# Patient Record
Sex: Male | Born: 1935 | Race: Black or African American | Hispanic: No | Marital: Married | Smoking: Former smoker
Health system: Southern US, Community
[De-identification: ages and names within clinical notes are randomized; demographics above are authoritative.]

## PROBLEM LIST (undated history)

## (undated) DIAGNOSIS — I219 Acute myocardial infarction, unspecified: Secondary | ICD-10-CM

## (undated) DIAGNOSIS — F0390 Unspecified dementia without behavioral disturbance: Secondary | ICD-10-CM

## (undated) DIAGNOSIS — S82142A Displaced bicondylar fracture of left tibia, initial encounter for closed fracture: Secondary | ICD-10-CM

## (undated) DIAGNOSIS — Z951 Presence of aortocoronary bypass graft: Secondary | ICD-10-CM

## (undated) DIAGNOSIS — I639 Cerebral infarction, unspecified: Secondary | ICD-10-CM

## (undated) DIAGNOSIS — I10 Essential (primary) hypertension: Secondary | ICD-10-CM

## (undated) DIAGNOSIS — I2510 Atherosclerotic heart disease of native coronary artery without angina pectoris: Secondary | ICD-10-CM

## (undated) DIAGNOSIS — I824Z9 Acute embolism and thrombosis of unspecified deep veins of unspecified distal lower extremity: Secondary | ICD-10-CM

## (undated) DIAGNOSIS — M1990 Unspecified osteoarthritis, unspecified site: Secondary | ICD-10-CM

## (undated) DIAGNOSIS — E118 Type 2 diabetes mellitus with unspecified complications: Secondary | ICD-10-CM

## (undated) HISTORY — DX: Atherosclerotic heart disease of native coronary artery without angina pectoris: I25.10

## (undated) HISTORY — DX: Unspecified osteoarthritis, unspecified site: M19.90

## (undated) HISTORY — DX: Cerebral infarction, unspecified: I63.9

## (undated) HISTORY — DX: Displaced bicondylar fracture of left tibia, initial encounter for closed fracture: S82.142A

## (undated) HISTORY — DX: Presence of aortocoronary bypass graft: Z95.1

## (undated) HISTORY — DX: Acute embolism and thrombosis of unspecified deep veins of unspecified distal lower extremity: I82.4Z9

## (undated) HISTORY — DX: Type 2 diabetes mellitus with unspecified complications: E11.8

## (undated) HISTORY — PX: LEG SURGERY: SHX1003

## (undated) HISTORY — PX: NM MYOVIEW LTD: HXRAD82

## (undated) HISTORY — DX: Essential (primary) hypertension: I10

## (undated) HISTORY — DX: Acute myocardial infarction, unspecified: I21.9

---

## 1996-05-31 DIAGNOSIS — Z951 Presence of aortocoronary bypass graft: Secondary | ICD-10-CM

## 1996-05-31 HISTORY — DX: Presence of aortocoronary bypass graft: Z95.1

## 1996-05-31 HISTORY — PX: CORONARY ARTERY BYPASS GRAFT: SHX141

## 1998-05-31 HISTORY — PX: CARDIAC CATHETERIZATION: SHX172

## 1998-11-12 ENCOUNTER — Ambulatory Visit (HOSPITAL_COMMUNITY): Admission: RE | Admit: 1998-11-12 | Discharge: 1998-11-12 | Payer: Self-pay | Admitting: Cardiology

## 2002-11-14 ENCOUNTER — Ambulatory Visit (HOSPITAL_COMMUNITY): Admission: RE | Admit: 2002-11-14 | Discharge: 2002-11-14 | Payer: Self-pay | Admitting: Endocrinology

## 2002-11-14 ENCOUNTER — Encounter: Payer: Self-pay | Admitting: Endocrinology

## 2003-01-18 ENCOUNTER — Emergency Department (HOSPITAL_COMMUNITY): Admission: EM | Admit: 2003-01-18 | Discharge: 2003-01-18 | Payer: Self-pay | Admitting: Emergency Medicine

## 2004-05-21 ENCOUNTER — Emergency Department (HOSPITAL_COMMUNITY): Admission: EM | Admit: 2004-05-21 | Discharge: 2004-05-21 | Payer: Self-pay | Admitting: Emergency Medicine

## 2004-08-11 ENCOUNTER — Emergency Department (HOSPITAL_COMMUNITY): Admission: EM | Admit: 2004-08-11 | Discharge: 2004-08-12 | Payer: Self-pay | Admitting: Emergency Medicine

## 2004-08-17 ENCOUNTER — Ambulatory Visit: Payer: Self-pay | Admitting: Endocrinology

## 2005-02-26 ENCOUNTER — Emergency Department (HOSPITAL_COMMUNITY): Admission: EM | Admit: 2005-02-26 | Discharge: 2005-02-27 | Payer: Self-pay | Admitting: Emergency Medicine

## 2005-06-08 ENCOUNTER — Ambulatory Visit: Payer: Self-pay | Admitting: Endocrinology

## 2005-06-21 ENCOUNTER — Ambulatory Visit: Payer: Self-pay | Admitting: Endocrinology

## 2005-06-27 ENCOUNTER — Ambulatory Visit: Payer: Self-pay | Admitting: Internal Medicine

## 2005-06-27 ENCOUNTER — Inpatient Hospital Stay (HOSPITAL_COMMUNITY): Admission: EM | Admit: 2005-06-27 | Discharge: 2005-06-28 | Payer: Self-pay | Admitting: *Deleted

## 2005-06-27 ENCOUNTER — Ambulatory Visit: Payer: Self-pay | Admitting: Cardiology

## 2005-06-29 ENCOUNTER — Ambulatory Visit: Payer: Self-pay

## 2005-07-06 ENCOUNTER — Ambulatory Visit: Payer: Self-pay | Admitting: Endocrinology

## 2005-07-07 ENCOUNTER — Ambulatory Visit: Payer: Self-pay | Admitting: Cardiology

## 2005-11-18 ENCOUNTER — Ambulatory Visit: Payer: Self-pay | Admitting: Endocrinology

## 2005-12-09 ENCOUNTER — Ambulatory Visit: Payer: Self-pay | Admitting: Endocrinology

## 2005-12-14 ENCOUNTER — Ambulatory Visit (HOSPITAL_COMMUNITY): Admission: RE | Admit: 2005-12-14 | Discharge: 2005-12-14 | Payer: Self-pay | Admitting: Endocrinology

## 2005-12-15 ENCOUNTER — Ambulatory Visit: Payer: Self-pay | Admitting: Endocrinology

## 2005-12-28 ENCOUNTER — Ambulatory Visit: Payer: Self-pay

## 2006-01-03 ENCOUNTER — Ambulatory Visit: Payer: Self-pay | Admitting: Endocrinology

## 2006-01-24 ENCOUNTER — Ambulatory Visit: Payer: Self-pay | Admitting: Endocrinology

## 2006-01-24 ENCOUNTER — Ambulatory Visit (HOSPITAL_COMMUNITY): Admission: RE | Admit: 2006-01-24 | Discharge: 2006-01-24 | Payer: Self-pay | Admitting: Endocrinology

## 2006-02-07 ENCOUNTER — Ambulatory Visit: Payer: Self-pay | Admitting: Internal Medicine

## 2006-02-21 ENCOUNTER — Ambulatory Visit: Payer: Self-pay | Admitting: Endocrinology

## 2006-03-07 ENCOUNTER — Ambulatory Visit: Payer: Self-pay | Admitting: Endocrinology

## 2006-03-08 ENCOUNTER — Ambulatory Visit: Payer: Self-pay | Admitting: Internal Medicine

## 2006-03-14 ENCOUNTER — Ambulatory Visit: Payer: Self-pay | Admitting: Endocrinology

## 2006-04-25 ENCOUNTER — Ambulatory Visit: Payer: Self-pay | Admitting: Endocrinology

## 2006-05-11 ENCOUNTER — Ambulatory Visit: Payer: Self-pay | Admitting: Cardiology

## 2006-05-25 ENCOUNTER — Ambulatory Visit: Payer: Self-pay

## 2006-06-07 ENCOUNTER — Ambulatory Visit: Payer: Self-pay | Admitting: Endocrinology

## 2006-06-15 ENCOUNTER — Ambulatory Visit: Payer: Self-pay | Admitting: Endocrinology

## 2006-09-16 ENCOUNTER — Ambulatory Visit: Payer: Self-pay | Admitting: Endocrinology

## 2006-09-16 LAB — CONVERTED CEMR LAB
AST: 23 units/L (ref 0–37)
Basophils Absolute: 0 10*3/uL (ref 0.0–0.1)
Bilirubin, Direct: 0.1 mg/dL (ref 0.0–0.3)
Cholesterol: 185 mg/dL (ref 0–200)
Creatinine,U: 187.6 mg/dL
Eosinophils Absolute: 0.1 10*3/uL (ref 0.0–0.6)
Eosinophils Relative: 2.2 % (ref 0.0–5.0)
GFR calc Af Amer: 70 mL/min
GFR calc non Af Amer: 58 mL/min
Glucose, Bld: 135 mg/dL — ABNORMAL HIGH (ref 70–99)
HCT: 36.8 % — ABNORMAL LOW (ref 39.0–52.0)
HDL: 41.5 mg/dL (ref >39.0)
Hemoglobin, Urine: NEGATIVE
Hgb A1c MFr Bld: 7.6 % — ABNORMAL HIGH (ref 4.6–6.0)
LDL Cholesterol: 129 mg/dL — ABNORMAL HIGH (ref 0–99)
Lymphocytes Relative: 38 % (ref 12.0–46.0)
MCHC: 33.8 g/dL (ref 30.0–36.0)
MCV: 90.6 fL (ref 78.0–100.0)
Microalb Creat Ratio: 17.1 mg/g (ref 0.0–30.0)
Microalb, Ur: 3.2 mg/dL — ABNORMAL HIGH (ref 0.0–1.9)
Monocytes Absolute: 0.4 10*3/uL (ref 0.2–0.7)
Neutro Abs: 2.3 10*3/uL (ref 1.4–7.7)
Neutrophils Relative %: 49.4 % (ref 43.0–77.0)
Nitrite: NEGATIVE
Potassium: 4.2 meq/L (ref 3.5–5.1)
Sodium: 143 meq/L (ref 135–145)
Total CHOL/HDL Ratio: 4.5
Urobilinogen, UA: 0.2 (ref 0.0–1.0)
WBC: 4.5 10*3/uL (ref 4.5–10.5)

## 2006-09-22 ENCOUNTER — Ambulatory Visit: Payer: Self-pay | Admitting: Endocrinology

## 2006-11-01 ENCOUNTER — Ambulatory Visit: Payer: Self-pay | Admitting: Endocrinology

## 2006-11-11 ENCOUNTER — Ambulatory Visit: Payer: Self-pay | Admitting: Endocrinology

## 2006-12-06 ENCOUNTER — Emergency Department (HOSPITAL_COMMUNITY): Admission: EM | Admit: 2006-12-06 | Discharge: 2006-12-06 | Payer: Self-pay | Admitting: Emergency Medicine

## 2006-12-08 ENCOUNTER — Encounter: Payer: Self-pay | Admitting: Endocrinology

## 2006-12-08 DIAGNOSIS — F0390 Unspecified dementia without behavioral disturbance: Secondary | ICD-10-CM

## 2006-12-08 DIAGNOSIS — I739 Peripheral vascular disease, unspecified: Secondary | ICD-10-CM

## 2006-12-08 DIAGNOSIS — F411 Generalized anxiety disorder: Secondary | ICD-10-CM | POA: Insufficient documentation

## 2006-12-08 DIAGNOSIS — F329 Major depressive disorder, single episode, unspecified: Secondary | ICD-10-CM

## 2006-12-08 DIAGNOSIS — M1990 Unspecified osteoarthritis, unspecified site: Secondary | ICD-10-CM | POA: Insufficient documentation

## 2007-01-02 ENCOUNTER — Ambulatory Visit: Payer: Self-pay | Admitting: Endocrinology

## 2007-02-08 ENCOUNTER — Ambulatory Visit: Payer: Self-pay | Admitting: Endocrinology

## 2007-02-09 ENCOUNTER — Ambulatory Visit: Payer: Self-pay | Admitting: Endocrinology

## 2007-05-14 ENCOUNTER — Encounter: Payer: Self-pay | Admitting: Endocrinology

## 2007-05-21 ENCOUNTER — Encounter: Payer: Self-pay | Admitting: Endocrinology

## 2007-06-14 ENCOUNTER — Ambulatory Visit: Payer: Self-pay | Admitting: Endocrinology

## 2007-06-14 DIAGNOSIS — R972 Elevated prostate specific antigen [PSA]: Secondary | ICD-10-CM | POA: Insufficient documentation

## 2007-09-18 ENCOUNTER — Encounter: Payer: Self-pay | Admitting: Endocrinology

## 2007-10-25 ENCOUNTER — Ambulatory Visit: Payer: Self-pay | Admitting: Endocrinology

## 2007-10-25 DIAGNOSIS — I498 Other specified cardiac arrhythmias: Secondary | ICD-10-CM | POA: Insufficient documentation

## 2007-11-14 ENCOUNTER — Telehealth: Payer: Self-pay | Admitting: Endocrinology

## 2007-11-15 ENCOUNTER — Ambulatory Visit: Payer: Self-pay | Admitting: Endocrinology

## 2007-11-15 DIAGNOSIS — G4700 Insomnia, unspecified: Secondary | ICD-10-CM | POA: Insufficient documentation

## 2007-11-15 LAB — CONVERTED CEMR LAB
AST: 20 units/L (ref 0–37)
Albumin: 3.6 g/dL (ref 3.5–5.2)
Cholesterol: 170 mg/dL (ref 0–200)
Folate: 15.1 ng/mL
Hgb A1c MFr Bld: 7.9 % — ABNORMAL HIGH (ref 4.6–6.0)
LDL Cholesterol: 101 mg/dL — ABNORMAL HIGH (ref 0–99)
Total CHOL/HDL Ratio: 3
VLDL: 12 mg/dL (ref 0–40)

## 2008-03-27 ENCOUNTER — Ambulatory Visit: Payer: Self-pay | Admitting: Endocrinology

## 2008-04-02 ENCOUNTER — Telehealth: Payer: Self-pay | Admitting: Endocrinology

## 2008-04-02 ENCOUNTER — Encounter (INDEPENDENT_AMBULATORY_CARE_PROVIDER_SITE_OTHER): Payer: Self-pay | Admitting: *Deleted

## 2008-04-26 ENCOUNTER — Inpatient Hospital Stay (HOSPITAL_COMMUNITY): Admission: EM | Admit: 2008-04-26 | Discharge: 2008-05-07 | Payer: Self-pay | Admitting: Emergency Medicine

## 2008-04-30 ENCOUNTER — Ambulatory Visit: Payer: Self-pay | Admitting: Physical Medicine & Rehabilitation

## 2008-05-07 ENCOUNTER — Telehealth (INDEPENDENT_AMBULATORY_CARE_PROVIDER_SITE_OTHER): Payer: Self-pay | Admitting: *Deleted

## 2008-07-24 ENCOUNTER — Encounter: Payer: Self-pay | Admitting: Endocrinology

## 2008-07-29 ENCOUNTER — Encounter (INDEPENDENT_AMBULATORY_CARE_PROVIDER_SITE_OTHER): Payer: Self-pay | Admitting: *Deleted

## 2008-08-13 ENCOUNTER — Encounter (INDEPENDENT_AMBULATORY_CARE_PROVIDER_SITE_OTHER): Payer: Self-pay | Admitting: *Deleted

## 2008-08-29 ENCOUNTER — Encounter (INDEPENDENT_AMBULATORY_CARE_PROVIDER_SITE_OTHER): Payer: Self-pay | Admitting: *Deleted

## 2008-08-29 ENCOUNTER — Ambulatory Visit: Payer: Self-pay | Admitting: Internal Medicine

## 2008-08-29 DIAGNOSIS — N318 Other neuromuscular dysfunction of bladder: Secondary | ICD-10-CM

## 2008-08-29 DIAGNOSIS — S82409A Unspecified fracture of shaft of unspecified fibula, initial encounter for closed fracture: Secondary | ICD-10-CM

## 2008-08-29 DIAGNOSIS — S82209A Unspecified fracture of shaft of unspecified tibia, initial encounter for closed fracture: Secondary | ICD-10-CM | POA: Insufficient documentation

## 2008-08-29 LAB — CONVERTED CEMR LAB
AST: 27 units/L (ref 0–37)
Alkaline Phosphatase: 68 units/L (ref 39–117)
BUN: 19 mg/dL (ref 6–23)
Basophils Absolute: 0 10*3/uL (ref 0.0–0.1)
Calcium: 9.1 mg/dL (ref 8.4–10.5)
GFR calc non Af Amer: 94.05 mL/min (ref >60)
Glucose, Bld: 76 mg/dL (ref 70–99)
Hemoglobin, Urine: NEGATIVE
Hemoglobin: 11.8 g/dL — ABNORMAL LOW (ref 13.0–17.0)
Lymphocytes Relative: 43.2 % (ref 12.0–46.0)
Monocytes Relative: 10.4 % (ref 3.0–12.0)
Nitrite: NEGATIVE
Platelets: 215 10*3/uL (ref 150.0–400.0)
RDW: 13.8 % (ref 11.5–14.6)
Sodium: 136 meq/L (ref 135–145)
Total Bilirubin: 0.5 mg/dL (ref 0.3–1.2)
pH: 6 (ref 5.0–8.0)

## 2008-08-30 ENCOUNTER — Encounter: Payer: Self-pay | Admitting: Internal Medicine

## 2008-09-06 ENCOUNTER — Encounter: Payer: Self-pay | Admitting: Endocrinology

## 2008-09-06 ENCOUNTER — Telehealth (INDEPENDENT_AMBULATORY_CARE_PROVIDER_SITE_OTHER): Payer: Self-pay | Admitting: *Deleted

## 2008-09-12 ENCOUNTER — Ambulatory Visit: Payer: Self-pay | Admitting: Endocrinology

## 2008-09-17 ENCOUNTER — Encounter: Payer: Self-pay | Admitting: Endocrinology

## 2008-09-18 ENCOUNTER — Encounter: Payer: Self-pay | Admitting: Endocrinology

## 2008-09-21 ENCOUNTER — Encounter: Payer: Self-pay | Admitting: Endocrinology

## 2008-09-26 ENCOUNTER — Ambulatory Visit: Payer: Self-pay | Admitting: Endocrinology

## 2008-10-10 ENCOUNTER — Ambulatory Visit: Payer: Self-pay | Admitting: Endocrinology

## 2008-10-17 ENCOUNTER — Encounter: Payer: Self-pay | Admitting: Endocrinology

## 2008-10-21 ENCOUNTER — Encounter: Payer: Self-pay | Admitting: Endocrinology

## 2008-10-22 ENCOUNTER — Ambulatory Visit: Payer: Self-pay | Admitting: Endocrinology

## 2008-10-22 DIAGNOSIS — N529 Male erectile dysfunction, unspecified: Secondary | ICD-10-CM

## 2008-11-06 ENCOUNTER — Encounter: Payer: Self-pay | Admitting: Endocrinology

## 2008-11-21 ENCOUNTER — Encounter: Payer: Self-pay | Admitting: Endocrinology

## 2008-11-26 ENCOUNTER — Telehealth (INDEPENDENT_AMBULATORY_CARE_PROVIDER_SITE_OTHER): Payer: Self-pay | Admitting: *Deleted

## 2008-12-04 ENCOUNTER — Encounter: Payer: Self-pay | Admitting: Endocrinology

## 2008-12-11 ENCOUNTER — Encounter: Payer: Self-pay | Admitting: Endocrinology

## 2009-01-29 ENCOUNTER — Ambulatory Visit: Payer: Self-pay | Admitting: Endocrinology

## 2009-02-18 ENCOUNTER — Ambulatory Visit: Payer: Self-pay | Admitting: Endocrinology

## 2009-02-18 DIAGNOSIS — M545 Low back pain: Secondary | ICD-10-CM

## 2009-02-18 DIAGNOSIS — R109 Unspecified abdominal pain: Secondary | ICD-10-CM | POA: Insufficient documentation

## 2009-03-03 ENCOUNTER — Ambulatory Visit: Payer: Self-pay | Admitting: Endocrinology

## 2009-03-03 ENCOUNTER — Telehealth: Payer: Self-pay | Admitting: Endocrinology

## 2009-03-04 LAB — CONVERTED CEMR LAB
Amylase: 64 units/L (ref 27–131)
Basophils Relative: 0.2 % (ref 0.0–3.0)
Eosinophils Absolute: 0 10*3/uL (ref 0.0–0.7)
Eosinophils Relative: 0.9 % (ref 0.0–5.0)
Hemoglobin: 11.6 g/dL — ABNORMAL LOW (ref 13.0–17.0)
Hgb A1c MFr Bld: 6.8 % — ABNORMAL HIGH (ref 4.6–6.5)
Lymphocytes Relative: 38 % (ref 12.0–46.0)
MCHC: 33.2 g/dL (ref 30.0–36.0)
Microalb, Ur: 0.8 mg/dL (ref 0.0–1.9)
Neutro Abs: 2.6 10*3/uL (ref 1.4–7.7)
RBC: 3.77 M/uL — ABNORMAL LOW (ref 4.22–5.81)
Sed Rate: 50 mm/hr — ABNORMAL HIGH (ref 0–22)
Transferrin: 272 mg/dL (ref 212.0–360.0)
WBC: 5 10*3/uL (ref 4.5–10.5)

## 2009-03-05 ENCOUNTER — Telehealth: Payer: Self-pay | Admitting: Endocrinology

## 2009-03-06 ENCOUNTER — Encounter: Payer: Self-pay | Admitting: Endocrinology

## 2009-03-06 ENCOUNTER — Telehealth: Payer: Self-pay | Admitting: Endocrinology

## 2009-03-10 ENCOUNTER — Ambulatory Visit: Payer: Self-pay | Admitting: Endocrinology

## 2009-03-12 ENCOUNTER — Telehealth: Payer: Self-pay | Admitting: Endocrinology

## 2009-03-17 ENCOUNTER — Telehealth: Payer: Self-pay | Admitting: Endocrinology

## 2009-03-17 ENCOUNTER — Ambulatory Visit: Payer: Self-pay | Admitting: Endocrinology

## 2009-03-17 DIAGNOSIS — R358 Other polyuria: Secondary | ICD-10-CM

## 2009-03-17 LAB — CONVERTED CEMR LAB
Ketones, ur: NEGATIVE mg/dL
Leukocytes, UA: NEGATIVE
Nitrite: NEGATIVE
Specific Gravity, Urine: 1.02 (ref 1.000–1.030)
Urobilinogen, UA: 0.2 (ref 0.0–1.0)
pH: 6 (ref 5.0–8.0)

## 2009-03-24 ENCOUNTER — Telehealth: Payer: Self-pay | Admitting: Endocrinology

## 2009-04-02 ENCOUNTER — Ambulatory Visit: Payer: Self-pay | Admitting: Endocrinology

## 2009-05-06 ENCOUNTER — Telehealth: Payer: Self-pay | Admitting: Endocrinology

## 2009-05-11 ENCOUNTER — Encounter: Payer: Self-pay | Admitting: Endocrinology

## 2009-06-26 ENCOUNTER — Encounter: Payer: Self-pay | Admitting: Endocrinology

## 2009-07-01 ENCOUNTER — Ambulatory Visit: Payer: Self-pay | Admitting: Endocrinology

## 2009-07-01 DIAGNOSIS — R079 Chest pain, unspecified: Secondary | ICD-10-CM | POA: Insufficient documentation

## 2009-07-03 ENCOUNTER — Telehealth: Payer: Self-pay | Admitting: Endocrinology

## 2009-07-07 ENCOUNTER — Encounter: Payer: Self-pay | Admitting: Endocrinology

## 2009-07-07 ENCOUNTER — Telehealth (INDEPENDENT_AMBULATORY_CARE_PROVIDER_SITE_OTHER): Payer: Self-pay

## 2009-07-08 ENCOUNTER — Ambulatory Visit: Payer: Self-pay

## 2009-07-08 ENCOUNTER — Ambulatory Visit: Payer: Self-pay | Admitting: Cardiology

## 2009-07-08 ENCOUNTER — Encounter (HOSPITAL_COMMUNITY): Admission: RE | Admit: 2009-07-08 | Discharge: 2009-09-03 | Payer: Self-pay | Admitting: Endocrinology

## 2009-07-17 ENCOUNTER — Ambulatory Visit: Payer: Self-pay | Admitting: Endocrinology

## 2009-07-17 DIAGNOSIS — H538 Other visual disturbances: Secondary | ICD-10-CM

## 2009-07-17 DIAGNOSIS — D509 Iron deficiency anemia, unspecified: Secondary | ICD-10-CM | POA: Insufficient documentation

## 2009-07-18 ENCOUNTER — Encounter (INDEPENDENT_AMBULATORY_CARE_PROVIDER_SITE_OTHER): Payer: Self-pay | Admitting: *Deleted

## 2009-07-20 ENCOUNTER — Encounter: Payer: Self-pay | Admitting: Endocrinology

## 2009-07-21 ENCOUNTER — Telehealth (INDEPENDENT_AMBULATORY_CARE_PROVIDER_SITE_OTHER): Payer: Self-pay | Admitting: *Deleted

## 2009-07-24 ENCOUNTER — Emergency Department (HOSPITAL_COMMUNITY): Admission: EM | Admit: 2009-07-24 | Discharge: 2009-07-25 | Payer: Self-pay | Admitting: Emergency Medicine

## 2009-07-30 ENCOUNTER — Telehealth (INDEPENDENT_AMBULATORY_CARE_PROVIDER_SITE_OTHER): Payer: Self-pay | Admitting: *Deleted

## 2009-08-11 ENCOUNTER — Encounter (INDEPENDENT_AMBULATORY_CARE_PROVIDER_SITE_OTHER): Payer: Self-pay | Admitting: *Deleted

## 2009-08-11 ENCOUNTER — Emergency Department (HOSPITAL_COMMUNITY): Admission: EM | Admit: 2009-08-11 | Discharge: 2009-08-11 | Payer: Self-pay | Admitting: Emergency Medicine

## 2009-08-13 ENCOUNTER — Ambulatory Visit: Payer: Self-pay | Admitting: Internal Medicine

## 2009-08-13 DIAGNOSIS — N400 Enlarged prostate without lower urinary tract symptoms: Secondary | ICD-10-CM

## 2009-08-14 ENCOUNTER — Encounter: Payer: Self-pay | Admitting: Internal Medicine

## 2009-08-21 ENCOUNTER — Telehealth: Payer: Self-pay | Admitting: Endocrinology

## 2009-08-26 ENCOUNTER — Encounter: Payer: Self-pay | Admitting: Endocrinology

## 2009-08-29 ENCOUNTER — Encounter: Payer: Self-pay | Admitting: Endocrinology

## 2009-08-29 ENCOUNTER — Telehealth: Payer: Self-pay | Admitting: Endocrinology

## 2009-09-03 ENCOUNTER — Encounter: Payer: Self-pay | Admitting: Endocrinology

## 2009-09-05 ENCOUNTER — Telehealth (INDEPENDENT_AMBULATORY_CARE_PROVIDER_SITE_OTHER): Payer: Self-pay | Admitting: *Deleted

## 2009-09-09 ENCOUNTER — Encounter: Payer: Self-pay | Admitting: Endocrinology

## 2009-09-10 ENCOUNTER — Telehealth (INDEPENDENT_AMBULATORY_CARE_PROVIDER_SITE_OTHER): Payer: Self-pay | Admitting: *Deleted

## 2009-09-24 ENCOUNTER — Observation Stay (HOSPITAL_COMMUNITY)
Admission: EM | Admit: 2009-09-24 | Discharge: 2009-09-26 | Payer: Self-pay | Source: Home / Self Care | Admitting: Emergency Medicine

## 2009-10-02 ENCOUNTER — Ambulatory Visit: Payer: Self-pay | Admitting: Endocrinology

## 2009-10-24 ENCOUNTER — Encounter: Payer: Self-pay | Admitting: Endocrinology

## 2009-10-24 ENCOUNTER — Telehealth (INDEPENDENT_AMBULATORY_CARE_PROVIDER_SITE_OTHER): Payer: Self-pay | Admitting: *Deleted

## 2009-10-28 ENCOUNTER — Telehealth (INDEPENDENT_AMBULATORY_CARE_PROVIDER_SITE_OTHER): Payer: Self-pay | Admitting: *Deleted

## 2009-10-29 ENCOUNTER — Ambulatory Visit: Payer: Self-pay | Admitting: Internal Medicine

## 2009-10-29 LAB — CONVERTED CEMR LAB
Calcium: 9.1 mg/dL (ref 8.4–10.5)
Cholesterol: 160 mg/dL (ref 0–200)
GFR calc non Af Amer: 71.15 mL/min (ref >60)
Glucose, Bld: 140 mg/dL — ABNORMAL HIGH (ref 70–99)
Sodium: 138 meq/L (ref 135–145)
Total CHOL/HDL Ratio: 4
Triglycerides: 94 mg/dL (ref 0.0–149.0)

## 2009-10-31 ENCOUNTER — Encounter: Payer: Self-pay | Admitting: Internal Medicine

## 2009-11-02 ENCOUNTER — Encounter: Payer: Self-pay | Admitting: Endocrinology

## 2009-11-05 ENCOUNTER — Encounter: Payer: Self-pay | Admitting: Endocrinology

## 2009-12-02 ENCOUNTER — Encounter: Payer: Self-pay | Admitting: Endocrinology

## 2009-12-03 ENCOUNTER — Encounter: Payer: Self-pay | Admitting: Endocrinology

## 2010-01-05 ENCOUNTER — Ambulatory Visit: Payer: Self-pay | Admitting: Endocrinology

## 2010-01-05 ENCOUNTER — Encounter: Payer: Self-pay | Admitting: Endocrinology

## 2010-01-05 LAB — CONVERTED CEMR LAB
Nitrite: NEGATIVE
Specific Gravity, Urine: 1.025 (ref 1.000–1.030)
Urine Glucose: NEGATIVE mg/dL
Urobilinogen, UA: 0.2 (ref 0.0–1.0)

## 2010-01-14 ENCOUNTER — Encounter: Payer: Self-pay | Admitting: Internal Medicine

## 2010-01-14 ENCOUNTER — Telehealth: Payer: Self-pay | Admitting: Internal Medicine

## 2010-02-08 ENCOUNTER — Encounter: Payer: Self-pay | Admitting: Endocrinology

## 2010-02-25 ENCOUNTER — Encounter: Payer: Self-pay | Admitting: Internal Medicine

## 2010-03-19 ENCOUNTER — Ambulatory Visit: Payer: Self-pay | Admitting: Endocrinology

## 2010-03-20 LAB — CONVERTED CEMR LAB
Basophils Absolute: 0 10*3/uL (ref 0.0–0.1)
Hemoglobin: 11.2 g/dL — ABNORMAL LOW (ref 13.0–17.0)
Hgb A1c MFr Bld: 8.1 % — ABNORMAL HIGH (ref 4.6–6.5)
Iron: 84 ug/dL (ref 42–165)
Lymphocytes Relative: 34.5 % (ref 12.0–46.0)
Monocytes Relative: 10.2 % (ref 3.0–12.0)
Neutro Abs: 2.8 10*3/uL (ref 1.4–7.7)
Neutrophils Relative %: 53.9 % (ref 43.0–77.0)
Platelets: 186 10*3/uL (ref 150.0–400.0)
RDW: 14 % (ref 11.5–14.6)
Saturation Ratios: 22.3 % (ref 20.0–50.0)
Transferrin: 269.2 mg/dL (ref 212.0–360.0)

## 2010-04-17 ENCOUNTER — Telehealth: Payer: Self-pay | Admitting: Endocrinology

## 2010-04-29 ENCOUNTER — Encounter: Payer: Self-pay | Admitting: Endocrinology

## 2010-05-05 ENCOUNTER — Encounter: Payer: Self-pay | Admitting: Endocrinology

## 2010-05-06 ENCOUNTER — Encounter: Payer: Self-pay | Admitting: Endocrinology

## 2010-05-08 ENCOUNTER — Encounter: Payer: Self-pay | Admitting: Endocrinology

## 2010-05-14 ENCOUNTER — Encounter: Payer: Self-pay | Admitting: Endocrinology

## 2010-05-27 ENCOUNTER — Emergency Department (HOSPITAL_COMMUNITY)
Admission: EM | Admit: 2010-05-27 | Discharge: 2010-05-28 | Payer: Self-pay | Source: Home / Self Care | Admitting: General Surgery

## 2010-05-28 ENCOUNTER — Encounter: Payer: Self-pay | Admitting: Internal Medicine

## 2010-06-09 ENCOUNTER — Encounter: Payer: Self-pay | Admitting: Endocrinology

## 2010-06-17 ENCOUNTER — Encounter: Payer: Self-pay | Admitting: Endocrinology

## 2010-06-17 ENCOUNTER — Telehealth: Payer: Self-pay | Admitting: Endocrinology

## 2010-06-21 ENCOUNTER — Encounter: Payer: Self-pay | Admitting: Endocrinology

## 2010-06-28 LAB — CONVERTED CEMR LAB
AST: 21 units/L (ref 0–37)
Alkaline Phosphatase: 55 units/L (ref 39–117)
Basophils Relative: 0.9 % (ref 0.0–3.0)
Chloride: 104 meq/L (ref 96–112)
Eosinophils Relative: 1.7 % (ref 0.0–5.0)
Hemoglobin: 11.1 g/dL — ABNORMAL LOW (ref 13.0–17.0)
Hgb A1c MFr Bld: 7.5 % — ABNORMAL HIGH (ref 4.6–6.5)
Leukocytes, UA: NEGATIVE
MCV: 92.3 fL (ref 78.0–100.0)
Monocytes Absolute: 0.5 10*3/uL (ref 0.1–1.0)
Neutro Abs: 3.3 10*3/uL (ref 1.4–7.7)
Neutrophils Relative %: 59.4 % (ref 43.0–77.0)
Nitrite: NEGATIVE
PSA: 5.23 ng/mL — ABNORMAL HIGH (ref 0.10–4.00)
Potassium: 4 meq/L (ref 3.5–5.1)
RBC: 3.58 M/uL — ABNORMAL LOW (ref 4.22–5.81)
Saturation Ratios: 14.6 % — ABNORMAL LOW (ref 20.0–50.0)
Specific Gravity, Urine: 1.02 (ref 1.000–1.030)
Total Bilirubin: 0.3 mg/dL (ref 0.3–1.2)
Transferrin: 273.6 mg/dL (ref 212.0–360.0)
VLDL: 11.2 mg/dL (ref 0.0–40.0)
WBC: 5.5 10*3/uL (ref 4.5–10.5)
pH: 7 (ref 5.0–8.0)

## 2010-07-02 NOTE — Miscellaneous (Signed)
Summary: Order/[facility]   Imported By: Lester Crayne 11/05/2009 09:53:08  _____________________________________________________________________  External Attachment:    Type:   Image     Comment:   External Document

## 2010-07-02 NOTE — Miscellaneous (Signed)
Summary: Order/[facility]  Order/[facility]   Imported By: Lester  01/16/2010 10:04:41  _____________________________________________________________________  External Attachment:    Type:   Image     Comment:   External Document

## 2010-07-02 NOTE — Miscellaneous (Signed)
Summary: Physician Visit Summary/[facility]  Physician Visit Summary/[facility]   Imported By: Sherian Rein 07/14/2009 09:18:52  _____________________________________________________________________  External Attachment:    Type:   Image     Comment:   External Document

## 2010-07-02 NOTE — Progress Notes (Signed)
Summary: [facility]  Phone Note Outgoing Call   Summary of Call: Faxed completed paperwork to [facility] and sent a copy  to be scanned. Initial call taken by: Josph Macho RMA,  Oct 28, 2009 1:36 PM

## 2010-07-02 NOTE — Miscellaneous (Signed)
Summary: Medication orders/[facility]  Medication orders/[facility]   Imported By: Sherian Rein 05/13/2010 10:18:54  _____________________________________________________________________  External Attachment:    Type:   Image     Comment:   External Document

## 2010-07-02 NOTE — Assessment & Plan Note (Signed)
Summary: f/u appt to prev appt/#/cd -- pls give written prescripts to pt.   Vital Signs:  Patient profile:   77 year old male Height:      65 inches (165.10 cm) Weight:      208.75 pounds (94.89 kg) BMI:     34.86 O2 Sat:      95 % on Room air Temp:     98.7 degrees F (37.06 degrees C) oral Pulse rate:   88 / minute BP sitting:   112 / 70  (left arm) Cuff size:   regular  Vitals Entered By: Brenton Grills MA (March 19, 2010 11:10 AM)  O2 Flow:  Room air CC: F/U to address medications/pt states he is having trouble sleeping/aj Comments if pt is given refills today please give him hard copy to take to pharmacy   Primary Provider:  Everardo All  CC:  F/U to address medications/pt states he is having trouble sleeping/aj.  History of Present Illness: dm:  no weight gain insomnia:  pt says this is contributed to by his roomate's oxygen equipment.  pt says he requests sleep med, but it is not given to him.   anemia:  no bleeding  Current Medications (verified): 1)  Simvastatin 80 Mg  Tabs (Simvastatin) .... Take 1 By Mouth Qhs 2)  Fluoxetine Hcl 40 Mg  Caps (Fluoxetine Hcl) .... Take 1 By Mouth Qd 3)  Zetia 10 Mg Tabs (Ezetimibe) .Marland Kitchen.. 1 Qd 4)  Aspirin Adult Low Strength 81 Mg Tbec (Aspirin) .Marland Kitchen.. 1 Qd 5)  Miralax  Powd (Polyethylene Glycol 3350) .Marland Kitchen.. 18 Grams Three Times Weekly (M,w,f) 6)  Januvia 100 Mg Tabs (Sitagliptin Phosphate) .Marland Kitchen.. 1 Qam 7)  Onetouch Ultra Test  Strp (Glucose Blood) .... Two Times A Day, and Lancets,and Alcohol Swabs 250.00 8)  Actos 45 Mg Tabs (Pioglitazone Hcl) .Marland Kitchen.. 1 Qam 9)  4-Point Cane .Marland Kitchen.. 331.0 10)  Omeprazole 20 Mg Cpdr (Omeprazole) .Marland Kitchen.. 1 Qd 11)  Metformin Hcl 500 Mg Xr24h-Tab (Metformin Hcl) .... 2 Qam 12)  Cholestyramine 4 Gm/dose Powd (Cholestyramine) .... 4 Grams Once Daily 13)  Lisinopril 5 Mg Tabs (Lisinopril) .Marland Kitchen.. 1 Once Daily 14)  Bromocriptine Mesylate 2.5 Mg Tabs (Bromocriptine Mesylate) .... 1/2 Tab Qhs 15)  Temazepam 15 Mg Caps  (Temazepam) .Marland Kitchen.. 1 At Bedtime As Needed For Sleep 16)  Advair Diskus 100-50 Mcg/dose Aepb (Fluticasone-Salmeterol) .Marland Kitchen.. 1 Puff Two Times A Day.  Rinse Mouth After Using. 17)  Ferrous Sulfate 325 (65 Fe) Mg Tabs (Ferrous Sulfate) .Marland Kitchen.. 1 Tablet By Mouth Once Daily 18)  Ciloxan 0.3 % Oint (Ciprofloxacin Hcl) .... Apply 1/4" To Left Eye At Bedtime  Allergies (verified): 1)  ! * Altace  Past History:  Past Medical History: Last updated: 08/13/2009 Anxiety Coronary artery disease Dementia Depression Diabetes mellitus, type II Hypertension Osteoarthritis Peripheral vascular disease History Cocaine abuse ? Alcholism Leukopenia Carotid Stenosis Multinodular Goiter Benign prostatic hypertrophy  Social History: Reviewed history from 10/02/2009 and no changes required. retired lives [facility] separated, but not legally so 7th grade education--illiterate Never Smoked Alcohol use-no Drug use--no more Regular exercise-no nok is sister Francoise Ceo, ph# [phone])  Review of Systems  The patient denies weight gain.         he has slight depression  Physical Exam  General:  obese.  no distress  Pulses:  dorsalis pedis intact bilat.   Extremities:  no deformity.  no ulcer on the feet.  feet are of normal color and temp.  there  is a healed vein harvest scar at the left leg. 1+ right pedal edema and 1+ left pedal edema.   Neurologic:  sensation is intact to touch on the feet. Additional Exam:  Hemoglobin A1C       [H]  8.1 %    Hemoglobin           [L]  11.2 g/dL                   04.5-40.9   Hematocrit           [L]  32.9 %        Impression & Recommendations:  Problem # 1:  DIABETES MELLITUS, TYPE II (ICD-250.00) needs increased rx  Problem # 2:  ANEMIA, IRON DEFICIENCY (ICD-280.9) uncertain etiology  Problem # 3:  INSOMNIA (ICD-780.52) needs increased rx  Medications Added to Medication List This Visit: 1)  Bromocriptine Mesylate 2.5 Mg Tabs  (Bromocriptine mesylate) .Marland Kitchen.. 1 tab at bedtime 2)  Ferrous Sulfate 325 (65 Fe) Mg Tabs (Ferrous sulfate) .Marland Kitchen.. 1 tablet by mouth once daily 3)  Ciloxan 0.3 % Oint (Ciprofloxacin hcl) .... Apply 1/4" to left eye at bedtime  Other Orders: TLB-A1C / Hgb A1C (Glycohemoglobin) (83036-A1C) TLB-CBC Platelet - w/Differential (85025-CBCD) TLB-IBC Pnl (Iron/FE;Transferrin) (83550-IBC) Est. Patient Level IV (81191)  Patient Instructions: 1)  take restoril 15 mg at bedtime as needed for sleep.   2)  blood tests are being ordered for you today.  please call [phone] to hear your test results. 3)  pending the test results, please increase bromocriptine to 2.5 mg at bedtime.   4)  here are some tests for blood in the bowels.  please follow instructions, and return to the lab.   5)  Please schedule a follow-up appointment in 3 months. 6)  (update: i left message on phone-tree:  rx as we discussed) Prescriptions: BROMOCRIPTINE MESYLATE 2.5 MG TABS (BROMOCRIPTINE MESYLATE) 1 tab at bedtime  #30 x 11   Entered and Authorized by:   Minus Breeding MD   Signed by:   Minus Breeding MD on 03/19/2010   Method used:   Print then Give to Patient   RxID:   [id number]    Orders Added: 1)  TLB-A1C / Hgb A1C (Glycohemoglobin) [83036-A1C] 2)  TLB-CBC Platelet - w/Differential [85025-CBCD] 3)  TLB-IBC Pnl (Iron/FE;Transferrin) [83550-IBC] 4)  Est. Patient Level IV [28413]

## 2010-07-02 NOTE — Letter (Signed)
Summary: New Patient letter  [facility]  [address] [place], [place] [address]   Phone: [phone]  Fax: [phone]       07/18/2009 MRN: [id number]  Colin [name] [address]  [place], [place]  [address]  Dear Colin [name],  Welcome to the Gastroenterology Division at [facility].    You are scheduled to see Dr.  Arlyce Dice on 08-18-09 at 10:45a.m. on the 3rd floor at [facility], [address].  We ask that you try to arrive at our office 15 minutes prior to your appointment time to allow for check-in.  We would like you to complete the enclosed self-administered evaluation form prior to your visit and bring it with you on the day of your appointment.  We will review it with you.  Also, please bring a complete list of all your medications or, if you prefer, bring the medication bottles and we will list them.  Please bring your insurance card so that we may make a copy of it.  If your insurance requires a referral to see a specialist, please bring your referral form from your primary care physician.  Co-payments are due at the time of your visit and may be paid by cash, check or credit card.     Your office visit will consist of a consult with your physician (includes a physical exam), any laboratory testing he/she may order, scheduling of any necessary diagnostic testing (e.g. x-ray, ultrasound, CT-scan), and scheduling of a procedure (e.g. Endoscopy, Colonoscopy) if required.  Please allow enough time on your schedule to allow for any/all of these possibilities.    If you cannot keep your appointment, please call [phone] to cancel or reschedule prior to your appointment date.  This allows [place] the opportunity to schedule an appointment for another patient in need of care.  If you do not cancel or reschedule by 5 p.m. the business day prior to your appointment date, you will be charged a $50.00 late cancellation/no-show fee.    Thank you  for choosing [facility] for your medical needs.  We appreciate the opportunity to care for you.  Please visit [place] at our website  to learn more about our practice.                     Sincerely,                                                             The Gastroenterology Division

## 2010-07-02 NOTE — Miscellaneous (Signed)
Summary: Physician Visit/[facility]  Physician Visit/[facility]   Imported By: Sherian Rein 09/15/2009 08:29:47  _____________________________________________________________________  External Attachment:    Type:   Image     Comment:   External Document

## 2010-07-02 NOTE — Miscellaneous (Signed)
Summary: Medication Orders / [facility]  Medication Orders / [facility]   Imported By: Lennie Odor 09/11/2009 14:07:39  _____________________________________________________________________  External Attachment:    Type:   Image     Comment:   External Document

## 2010-07-02 NOTE — Assessment & Plan Note (Signed)
Summary: 3 MO ROV /[facility]   Vital Signs:  Patient profile:   77 year old male Height:      65 inches (165.10 cm) Weight:      209 pounds (95.00 kg) BMI:     34.91 O2 Sat:      97 % on Room air Temp:     98.8 degrees F (37.11 degrees C) oral Pulse rate:   59 / minute BP sitting:   134 / 80  (left arm) Cuff size:   regular  Vitals Entered By: Brenton Grills MA (January 05, 2010 1:52 PM)  O2 Flow:  Room air CC: 3 mo F/U/aj Is Patient Diabetic? Yes   Primary Mikaelyn Arthurs:  ELLISON  CC:  3 mo F/U/aj.  History of Present Illness: pt states 8 mos of slight but sudden sensation at the suprapubic area that he has to urinate.  no assoc dysuria.     he brings a record of his cbg's which i have reviewed today.  all are low to mid-100's.   wife says pt intermittently wheezes.  Current Medications (verified): 1)  Simvastatin 80 Mg  Tabs (Simvastatin) .... Take 1 By Mouth Qhs 2)  Fluoxetine Hcl 40 Mg  Caps (Fluoxetine Hcl) .... Take 1 By Mouth Qd 3)  Zetia 10 Mg Tabs (Ezetimibe) .Marland Kitchen.. 1 Qd 4)  Aspirin Adult Low Strength 81 Mg Tbec (Aspirin) .Marland Kitchen.. 1 Qd 5)  Miralax  Powd (Polyethylene Glycol 3350) .Marland Kitchen.. 18 Grams Three Times Weekly (M,w,f) 6)  Januvia 100 Mg Tabs (Sitagliptin Phosphate) .Marland Kitchen.. 1 Qam 7)  Onetouch Ultra Test  Strp (Glucose Blood) .... Two Times A Day, and Lancets,and Alcohol Swabs 250.00 8)  Actos 45 Mg Tabs (Pioglitazone Hcl) .Marland Kitchen.. 1 Qam 9)  4-Point Cane .Marland Kitchen.. 331.0 10)  Omeprazole 20 Mg Cpdr (Omeprazole) .Marland Kitchen.. 1 Qd 11)  Metformin Hcl 500 Mg Xr24h-Tab (Metformin Hcl) .... 2 Qam 12)  Cholestyramine 4 Gm/dose Powd (Cholestyramine) .... 4 Grams Once Daily 13)  Lisinopril 5 Mg Tabs (Lisinopril) .Marland Kitchen.. 1 Once Daily 14)  Bromocriptine Mesylate 2.5 Mg Tabs (Bromocriptine Mesylate) .... 1/2 Tab Qhs 15)  Temazepam 15 Mg Caps (Temazepam) .Marland Kitchen.. 1 At Bedtime As Needed For Sleep  Allergies (verified): 1)  ! * Altace  Past History:  Past Medical History: Last updated: 08/13/2009 Anxiety Coronary  artery disease Dementia Depression Diabetes mellitus, type II Hypertension Osteoarthritis Peripheral vascular disease History Cocaine abuse ? Alcholism Leukopenia Carotid Stenosis Multinodular Goiter Benign prostatic hypertrophy  Review of Systems  The patient denies fever.         he also has decreased urinary stream  Physical Exam  General:  elderly, frail, no distress  Lungs:  Clear to auscultation bilaterally. Normal respiratory effort.  Abdomen:  no suprapubic sxs Additional Exam:  Hemoglobin A1C       [H]  7.4 %         Impression & Recommendations:  Problem # 1:  BENIGN PROSTATIC HYPERTROPHY (ICD-600.00) uncertain if this explains his urinary sxs  Problem # 2:  DIABETES MELLITUS, TYPE II (ICD-250.00) Assessment: Unchanged  Problem # 3:  asthma mild  Medications Added to Medication List This Visit: 1)  Advair Diskus 100-50 Mcg/dose Aepb (Fluticasone-salmeterol) .Marland Kitchen.. 1 puff two times a day.  rinse mouth after using.  Other Orders: Spirometry w/Graph (94010) TLB-A1C / Hgb A1C (Glycohemoglobin) (83036-A1C) TLB-Udip w/ Micro (81001-URINE) Est. Patient Level IV (54098)  Patient Instructions: 1)  instead of adding another medication, please try urinating every 2 hours. 2)  blood tests are being ordered for you today.  please call [phone] to hear your test results. 3)  add advair-100, 1 puff two times a day.  rinse mouth after using.   4)  Please schedule a follow-up appointment in 3 months. 5)  (update: i left message on phone-tree:  rx as we discussed)

## 2010-07-02 NOTE — Miscellaneous (Signed)
Summary: Orders / [facility]  Orders / [facility]   Imported By: Lennie Odor 05/01/2010 14:25:14  _____________________________________________________________________  External Attachment:    Type:   Image     Comment:   External Document

## 2010-07-02 NOTE — Progress Notes (Signed)
Phone Note Refill Request Message from:  Fax from Pharmacy on August 29, 2009 4:09 PM  Refills Requested: Medication #1:  TRIAZOLAM 0.25 MG TABS 2 qhs   Dosage confirmed as above?Dosage Confirmed  Medication #2:  METFORMIN HCL 500 MG XR24H-TAB 2 qam   Dosage confirmed as above?Dosage Confirmed  Medication #3:  LISINOPRIL 5 MG TABS 1 once daily   Dosage confirmed as above?Dosage Confirmed Initial call taken by: Josph Macho RMA,  August 29, 2009 4:10 PM  Follow-up for Phone Call        i signed triazolam rx Follow-up by: Minus Breeding MD,  August 29, 2009 4:15 PM    Prescriptions: TRIAZOLAM 0.25 MG TABS (TRIAZOLAM) 2 qhs  #60 x 2   Entered by:   Josph Macho RMA   Authorized by:   Minus Breeding MD   Signed by:   Josph Macho RMA on 08/29/2009   Method used:   Printed then faxed to ...       [facility] (mail-order)             , [place]         Ph:        Fax: [phone]   RxID:   [id number] LISINOPRIL 5 MG TABS (LISINOPRIL) 1 once daily  #30 x 2   Entered by:   Josph Macho RMA   Authorized by:   Minus Breeding MD   Signed by:   Josph Macho RMA on 08/29/2009   Method used:   Faxed to ...       [facility] (mail-order)             , [place]         Ph:        Fax: [phone]   RxID:   [id number] METFORMIN HCL 500 MG XR24H-TAB (METFORMIN HCL) 2 qam  #60 x 2   Entered by:   Josph Macho RMA   Authorized by:   Minus Breeding MD   Signed by:   Josph Macho RMA on 08/29/2009   Method used:   Faxed to ...       [facility] (mail-order)             , [place]         Ph:        Fax: [phone]   RxID:   [id number] LISINOPRIL 5 MG TABS (LISINOPRIL) 1 once daily  #30 x 2   Entered by:   Josph Macho RMA   Authorized by:   Minus Breeding MD   Signed by:   Josph Macho RMA on 08/29/2009   Method used:   Print then Give to Patient   RxID:   [id number] METFORMIN HCL 500 MG XR24H-TAB (METFORMIN HCL) 2 qam   #60 x 2   Entered by:   Josph Macho RMA   Authorized by:   Minus Breeding MD   Signed by:   Josph Macho RMA on 08/29/2009   Method used:   Print then Give to Patient   RxID:   [id number] TRIAZOLAM 0.25 MG TABS (TRIAZOLAM) 2 qhs  #60 x 2   Entered by:   Josph Macho RMA   Authorized by:   Minus Breeding MD   Signed by:   Josph Macho RMA on 08/29/2009   Method used:   Print then Give to Patient   RxID:   [id number]  Shredded the Lisinopril  and Metformin and sent faxed them electronically
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
## 2010-07-02 NOTE — Progress Notes (Signed)
Summary: Nuc. Pre-Procedure  Phone Note Outgoing Call Call back at [facility] Phone [phone]   Call placed by: Irean Hong, RN,  July 07, 2009 3:04 PM Summary of Call: Reviewed information on My[employer] Information Sheet (see scanned document for further details).  Spoke with patient's wife.     Nuclear Med Background Indications for Stress Test: Evaluation for Ischemia, Graft Patency, Stent Patency   History: CABG, Echo, Heart Catheterization, Myocardial Infarction, Myocardial Perfusion Study, Stents  History Comments: '97 NSTEMI>Stent CFX. '97 Echo:EF=55-60%. '98 CABG x 4. '00 Cath: Patent Grafts, Med Tx. 1/07 MPS: NL Study, EF=44%.  Symptoms: Chest Pain    Nuclear Pre-Procedure Cardiac Risk Factors: Carotid Disease, Hypertension, Lipids, NIDDM, PVD Height (in): 72

## 2010-07-02 NOTE — Miscellaneous (Signed)
Summary: D/C Medications order/[facility]  D/C Medications order/[facility]   Imported By: Sherian Rein 12/04/2009 14:05:09  _____________________________________________________________________  External Attachment:    Type:   Image     Comment:   External Document

## 2010-07-02 NOTE — Progress Notes (Signed)
Summary: paper chart  ---- Converted from flag ---- ---- 07/02/2009 1:40 PM, Minus Breeding MD wrote: paper chart please ? endoscopic studies. ------------------------------  Phone Note Other Incoming   Summary of Call: Paper chart requested Initial call taken by: Josph Macho CMA,  July 03, 2009 7:43 AM  Follow-up for Phone Call        MD referred pt on2/6/07, but several attempts to call pt were made with no success. Chart put on MD's desk. Follow-up by: Josph Macho CMA,  July 03, 2009 11:20 AM  Additional Follow-up for Phone Call Additional follow up Details #1::        noted, thank you Additional Follow-up by: Minus Breeding MD,  July 03, 2009 5:50 PM

## 2010-07-02 NOTE — Miscellaneous (Signed)
Summary: OT Clarification Order/[facility]  OT Clarification Order/[facility]   Imported By: Sherian Rein 10/30/2009 11:47:21  _____________________________________________________________________  External Attachment:    Type:   Image     Comment:   External Document

## 2010-07-02 NOTE — Progress Notes (Signed)
Summary: Rx req/[facility] pt  Prescriptions: ADVAIR DISKUS 100-50 MCG/DOSE AEPB (FLUTICASONE-SALMETEROL) 1 puff two times a day.  rinse mouth after using.  #1 x 11   Entered by:   Margaret Pyle, CMA   Authorized by:   Newt Lukes MD   Signed by:   Margaret Pyle, CMA on 01/14/2010   Method used:   Printed then faxed to ...       [facility]       [address]       [place], [place]  [address]       Ph: [phone]       Fax: [phone]   RxID:   [id number]

## 2010-07-02 NOTE — Miscellaneous (Signed)
Summary: Orders/[facility]  Orders/[facility]   Imported By: Lester Waverly 08/18/2009 10:50:11  _____________________________________________________________________  External Attachment:    Type:   Image     Comment:   External Document

## 2010-07-02 NOTE — Miscellaneous (Signed)
Summary: Order for Flu Vaccine / [facility]  Order for Flu Vaccine / [facility]   Imported By: Lennie Odor 02/27/2010 15:50:04  _____________________________________________________________________  External Attachment:    Type:   Image     Comment:   External Document

## 2010-07-02 NOTE — Progress Notes (Signed)
Summary: [facility]  Phone Note Outgoing Call   Summary of Call: Faxed completed paperwork to [facility] and sent a copy to be scanned. Initial call taken by: Josph Macho RMA,  September 05, 2009 12:50 PM

## 2010-07-02 NOTE — Progress Notes (Signed)
Summary: [facility]  Phone Note Outgoing Call   Summary of Call: Faxed completed paperwork and sent a copy to be scanned. Initial call taken by: Josph Macho RMA,  Oct 24, 2009 2:05 PM

## 2010-07-02 NOTE — Assessment & Plan Note (Signed)
Summary: POST HOSP/FL2-LB   Vital Signs:  Patient profile:   77 year old male Height:      67 inches (170.18 cm) Weight:      202.38 pounds (91.99 kg) O2 Sat:      96 % on Room air Temp:     97.4 degrees F (36.33 degrees C) oral Pulse rate:   97 / minute BP sitting:   130 / 60  (left arm) Cuff size:   large  Vitals Entered By: Josph Macho RMA (Oct 02, 2009 3:45 PM)  O2 Flow:  Room air CC: Post Hospital/ CF Is Patient Diabetic? Yes   Primary Provider:  Everardo All  CC:  [facility] Hospital/ CF.  History of Present Illness: the status of at least 3 ongoing medical problems is addressed today: pt was recently hospitalized for chest pain.  med list shows 3 meds.  pt states he feels well in general. chronic low-back pain:  pt says he is unaware if he takes any med for pain. insomnia:  he is unaware of being prescribed any med for sleep.  Current Medications (verified): 1)  Simvastatin 80 Mg  Tabs (Simvastatin) .... Take 1 By Mouth Qhs 2)  Fluoxetine Hcl 40 Mg  Caps (Fluoxetine Hcl) .... Take 1 By Mouth Qd 3)  Zetia 10 Mg Tabs (Ezetimibe) .Marland Kitchen.. 1 Qd 4)  Aspirin Adult Low Strength 81 Mg Tbec (Aspirin) .Marland Kitchen.. 1 Qd 5)  Miralax  Powd (Polyethylene Glycol 3350) .Marland Kitchen.. 18 Grams Three Times Weekly (M,w,f) 6)  Januvia 100 Mg Tabs (Sitagliptin Phosphate) .Marland Kitchen.. 1 Qam 7)  Onetouch Ultra Test  Strp (Glucose Blood) .... Two Times A Day, and Lancets,and Alcohol Swabs 250.00 8)  Actos 45 Mg Tabs (Pioglitazone Hcl) .Marland Kitchen.. 1 Qam 9)  4-Point Cane .Marland Kitchen.. 331.0 10)  Omeprazole 20 Mg Cpdr (Omeprazole) .Marland Kitchen.. 1 Qd 11)  Triazolam 0.25 Mg Tabs (Triazolam) .... 2 Qhs 12)  Metformin Hcl 500 Mg Xr24h-Tab (Metformin Hcl) .... 2 Qam 13)  Methocarbamol 500 Mg Tabs (Methocarbamol) .Marland Kitchen.. 1 Q6h As Needed Cramps 14)  Cholestyramine 4 Gm/dose Powd (Cholestyramine) .... 4 Grams Once Daily 15)  Lisinopril 5 Mg Tabs (Lisinopril) .Marland Kitchen.. 1 Once Daily 16)  Bromocriptine Mesylate 2.5 Mg Tabs (Bromocriptine Mesylate) .... 1/2 Tab Qhs 17)   Oxycodone Hcl 5 Mg Caps (Oxycodone Hcl) .Marland Kitchen.. 1 - 2 By Mouth Q 6 Hrs As Needed Pain  Allergies (verified): 1)  ! * Altace  Past History:  Past Medical History: Last updated: 08/13/2009 Anxiety Coronary artery disease Dementia Depression Diabetes mellitus, type II Hypertension Osteoarthritis Peripheral vascular disease History Cocaine abuse ? Alcholism Leukopenia Carotid Stenosis Multinodular Goiter Benign prostatic hypertrophy  Social History: Reviewed history from 03/03/2009 and no changes required. retired lives [employer] separated 7th grade education--illiterate Never Smoked Alcohol use-no Drug use--no more Regular exercise-no  Review of Systems       The patient complains of weight gain.         he denies falls.  Physical Exam  General:  elderly, frail, no distress  Msk:  gait is slow but steady with a walker Extremities:  no edema Additional Exam:  a1c in hospital was 7.1   Impression & Recommendations:  Problem # 1:  CHEST PAIN (ICD-786.50) noncardiogenic  Problem # 2:  INSOMNIA (ICD-780.52) i told pt a sleep med is on his med list  Problem # 3:  BACK PAIN, LUMBAR (ICD-724.2) i told pt he is on a pain med  Other Orders: Est. Patient Level  III [id number])  Patient Instructions: 1)  same medications. 2)  Please schedule a follow-up appointment in 3 months.

## 2010-07-02 NOTE — Discharge Summary (Signed)
Summary: Physicians ED report  Yonah, Tangeman - MRN: [id number] Acct#: [id number] PHYSICIAN DOCUMENTATION SHEET Tue Mar 15 07:44:53 EDT 2011 [facility] [address] [place], [place] [address] PHONE: [phone] MRN: [id number] Account #: [id number] Name: Colin [name], Colin [name] Sex: M Age: 77 DOB: 13-Sep-1932 Complaint: Fall Primary Diagnosis: Fall Arrival Time: 08/11/2009 08:23 Discharge Time: 08/11/2009 12:26 All Providers: Dr. Samuel Jester - DO PSA PROVIDER: Dr. Samuel Jester - DO PSA HPI: The patient is a 77 year old male who presents with a chief complaint of fall. The history was provided by the patient, nursing home report and EMS. The patient was seen at 08:55 AM. Per EMS, pt and NH report, c/o sudden onset and resolution of one episode of trip and fall while getting out of bed PTA. Pt states he hit his right flank area against a bedside table. c/o persistent constant pain in right flank, RUQ and right post-lat ribs areas. Denies hitting head, no LOC. Denies headache, denies neck or back pain, no prodromal symptoms before fall. Denies CP/palpitations, no SOB/cough, no N/V/D, no hematuria. 09:05 08/11/2009 by Samuel Jester - DO PSA, Dr. Linus Orn: Statement: all systems negative except as marked or noted in the HPI Constitutional: Negative for fever and chills. Eyes: Negative for eye pain and discharge. ENMT: Negative for ear pain, hoarseness, nasal congestion, sinus pressure and sore throat. Cardiovascular: Negative for palpitations, diaphoresis, dyspnea and peripheral edema. Respiratory: Negative for cough, wheezing and stridor. Gastrointestinal: Positive for abdominal pain. Negative for nausea, vomiting and diarrhea. Genitourinary: Negative for dysuria, flank pain and hematuria. Musculoskeletal: Positive for back pain and rib pain. Negative for arm injury, contusions, deformity, head injury, leg injury, neck pain, paresthesia and redness. Skin: Positive for  abrasions and bruising. Negative for rash, skin lesion, pruritus and blisters. Neuro: Negative for headache, lightheadedness and neck stiffness. 09:07 08/11/2009 by Samuel Jester - DO PSA, Dr. Earl Lagos: Documentation: physician reviewed/amended Historian: nursing home report Patient's Current Physicians Patient's Current Physicians (please list PCP first) Everardo All - MD, Cleophas Dunker - Endocrinology Past medical history: coronary artery disease, hypertension, diabetes, prostatic hypertrophy 1 Tyrome, Donatelli - MRN: [id number] Acct#: [id number] Social History: non-smoker, non-drinker, no drug abuse Allergies Drug Reaction Allergy Note Ramipril 09:06 08/11/2009 by Samuel Jester - DO PSA, Dr. Joseph Pierini Medications: Documentation: physician reviewed/amended Medications Medication [Medication] Dosage Frequency Last Dose Actos Oral 45mg  once a day aspirin Oral 81mg  once a day ferrous sulfate Oral 325mg  twice a day JanUVIA Oral 100mg  once a day lisinopril Oral 5mg  once a day metFORMIN Oral 500mg  once a day Miralax Oral 17grams specialized dosing Parlodel Oral 1.25mg  at bed time PriLOSEC Oral 20mg  once a day PROzac Oral 40mg  once a day Questran Oral 4 grams once a day Robaxin Oral 500mg  as needed senna Oral 1 tab as needed simvastatin Oral 80mg  at bed time triazolam Oral 0.5mg  at bed time ZeTIA Oral 10mg  twice a day 09:06 08/11/2009 by Samuel Jester - DO PSA, Dr. Physical examination: Vital signs and O2 SAT: reviewed Constitutional: well developed, well nourished, well hydrated, Uncomfortable appearing Head and Face: normocephalic, atraumatic Eyes: EOMI, PERRL, no scleral icterus ENMT: mouth and pharynx normal, mucous membranes moist Neck: supple, full range of motion, no lymphadenopathy Spine: NOTE - No midline CS, TS, LS tenderness. Cardiovascular: regular rate and rhythm, no murmur, rub, or gallop Respiratory: breath sounds clear & equal bilaterally, no rales, rhonchi, wheezes, or  rub, normal respiratory effort/excursion Chest: movement normal NOTE - +right lower ant-lat-post chest wall tender to palp. No soft tissue crepitus. +  ecchymosis with superficial abrasion right flank. Abdomen: soft, nondistended, normal bowel sounds NOTE - +tender RUQ to palp. No rebound or guarding. 2 Garlen, Reinig - MRN: [id number] Acct#: [id number] Genitourinary: no CVA tenderness Extremities: pulses normal, no tenderness, no edema Neuro: AA&Ox3, Cranial Nerves II-XII intact NOTE - No gross focal motor or sensory deficits in extremities. Skin: color normal, warm, dry 09:08 08/11/2009 by Samuel Jester - DO PSA, Dr. Reviewed result: Result Type: Cleda Daub: [id number] Step Type: XRAY Procedure Name: DG RIBS UNILATERAL W/CHEST*R* Procedure: DG RIBS UNILATERAL W/CHEST*R* Result: Clinical Data: The patient fell. Right rib pain. RIGHT RIBS AND CHEST - 3+ VIEW Comparison: 07/25/2009 Findings: No acute cardiopulmonary disease. No acute rib fracture. Sternal wire sutures and mediastinal clips (CABG). IMPRESSION: No acute chest disease findings. Negative right ribs. 09:37 08/11/2009 by Samuel Jester - DO PSA, Dr. Reviewed result: Result Type: Cleda Daub: [id number] Step Type: LAB Procedure Name: URINE MACROSCOPIC Procedure: URINE MACROSCOPIC Procedure Notes: LEUKOCYTE ESTERASE - MICROSCOPIC NOT DONE ON URINES WITH NEGATIVE PROTEIN, BLOOD, LEUKOCYTES, NITRITE, OR GLUCOSE <1000 mg/dL. Result: URINE COLOR YELLOW [YELLOW] URINE APPEARANCE CLEAR [CLEAR] URINE SPEC GRAVITY 1.019 [1.005-1.030] URINE PH 7.0 [5.0-8.0] URINE GLUCOSE NEGATIVE mg/dL [NEG] URINE HEMOGLOBIN NEGATIVE [NEG] URINE BILIRUBIN NEGATIVE [NEG] URINE KETONES NEGATIVE mg/dL [NEG] URINE TOTAL PROTEIN NEGATIVE mg/dL [NEG] 3 Webb, Weed - MRN: [id number] Acct#: [id number] URINE UROBILINOGEN 0.2 mg/dL [6.9-6.2] URINE NITRITE NEGATIVE [NEG] LEUKOCYTE ESTERASE NEGATIVE [NEG] 10:06 08/11/2009  by Samuel Jester - DO PSA, Dr. Reviewed result: Result Type: Cleda Daub: [id number] Step Type: LAB Procedure Name: CBC WITH DIFF Procedure: CBC WITH DIFF Result: WBC COUNT 6.9 K/uL [4.0-10.5] RBC COUNT 3.84 MIL/uL [4.22-5.81] L HEMOGLOBIN 11.4 g/dL [44.0-10.2] L HEMATOCRIT 35.5 % [39.0-52.0] L MCV 92.4 fL [78.0-100.0] MCHC 32.2 g/dL [72.5-36.6] RDW 44.0 % [11.5-15.5] PLATELET COUNT 194 K/uL [150-400] NEUTROPHIL 74 % [43-77] ABS GRANULOCYTE 5.1 K/uL [1.7-7.7] LYMPHOCYTE 18 % [12-46] ABS LYMPH 1.3 K/uL [0.7-4.0] MONOCYTE 7 % [3-12] ABS MONOCYTE 0.5 K/uL [0.1-1.0] EOSINOPHIL 0 % [0-5] ABS EOS 0.0 K/uL [0.0-0.7] BASOPHIL 1 % [0-1] ABS BASO 0.0 K/uL [0.0-0.1] 10:13 08/11/2009 by Samuel Jester - DO PSA, Dr. Reviewed result: Result Type: Cleda Daub: [id number] Step Type: LAB Procedure Name: BASIC METABOLIC PANEL Procedure: BASIC METABOLIC PANEL Procedure Notes: GFR, Est Afr Am - The eGFR has been calculated using the MDRD equation. This calculation has not been validated in all clinical situations. eGFR's persistently <60 mL/min signify possible Chronic Kidney Disease. Result: SODIUM 140 mEq/L [135-145] POTASSIUM 4.4 mEq/L [3.5-5.1] CHLORIDE 104 mEq/L [96-112] CARBON DIOXIDE 28 mEq/L [19-32] 4 Generoso, Cropper - MRN: [id number] Acct#: [id number] GLUCOSE 141 mg/dL [29-51] H BUN 16 mg/dL [8-84] CREATININE 1.66 mg/dL [0.6-3.0] CALCIUM 9.1 mg/dL [1.6-01.0] GFR, Est Non Af Am 57 mL/min [>60] L GFR, Est Afr Am >60 mL/min [>60] 10:35 08/11/2009 by Samuel Jester - DO PSA, Dr. Reviewed result: Result Type: Cleda Daub: [id number] Step Type: XRAY Procedure Name: CT ABD/PELVIS W CM Procedure: CT ABD/PELVIS W CM Result: Clinical Data: Fall. Blunt trauma. Right-sided abdominal and flank pain. Benign prostatic hypertrophy. CT ABDOMEN AND PELVIS WITH CONTRAST Technique: Multidetector CT imaging of the abdomen and pelvis was performed following the  standard protocol during bolus administration of intravenous contrast. Contrast: 100 ml Omnipaque-300 Comparison: None. Findings: Tiny less than 1 cm cyst is seen in the dome of the liver. No lacerations or contusions are seen involving the abdominal parenchymal organs. There is no evidence of hemoperitoneum. There is no evidence of bowel wall thickening or dilatation.  Markedly enlarged prostate gland is seen. Diffuse bladder wall thickening is seen, consistent with muscular hypertrophy. No other masses or adenopathy identified within the abdomen or pelvis. No evidence of inflammatory process or ascites. A nondisplaced fracture of the right lateral tenth rib is noted. No other acute fractures are identified. IMPRESSION: 1. Nondisplaced right lateral tenth rib fracture. 2. No evidence of abdominal or pelvic visceral injury or hemoperitoneum. 3. Markedly enlarged prostate, with diffuse bladder wall thickening consistent with muscular hypertrophy. 5 Ida, Uppal - MRN: [id number] Acct#: [id number] 11:18 08/11/2009 by Samuel Jester - DO PSA, Dr. Imaging: Limited Bedside Ultrasound Study Interpretation Comments Timestamp FAST no intraperitoneal free fluid, no pelvic free fluid 09:50 AM 09:55 08/11/2009 by Samuel Jester - DO PSA, Dr. MDM: Critical Care Provided: Critical care: direct patient care, resuscitation (except CPR), additional history taking, previous records reviewed, interpretation of diagnostic studies, serial examinations, pain management, respiratory and airway care (except intubation), documentation, telephone consultation with another physician Critical care time: 31 mins. Amount and complexity of data: nursing notes reviewed, current labs reviewed, current x-ray(s) reviewed Originally published: 11:19 08/11/2009 by Samuel Jester - DO PSA, Dr. Derrill Center: 12:29 08/11/2009 by Samuel Jester - DO PSA, Dr. ED Course: Comments: NOTE - Pain improved after  fentanyl. Able to lay on right side without difficulty. IS teach and treat completed by staff. Dx testing d/w pt. Questions answered. Verb understanding, agreeable to d/c home with outpt f/u. 11:35 08/11/2009 by Samuel Jester - DO PSA, Dr. Patient disposition: Patient disposition: Disch - Home Primary Diagnosis: fall Additional diagnoses: abrasion of right flank, contusion of right flank, fracture of right rib Counseling: advised of diagnosis, advised of treatment plan, advised of xray and lab findings, advised of need for close follow-up, advised of need to return for worsening or changing symptoms, advised of specific symptoms that should prompt their return, patient voices understanding 11:37 08/11/2009 by Samuel Jester - DO PSA, Dr. Prescriptions: Prescription Medication Dispense Sig Line 6 Montgomery, Favor - MRN: [id number] Acct#: [id number] Prescription Medication Dispense Sig Line HYDROcodoneacetaminophen 5 mg-325 mg Tab (12) twelve 1 tab by mouth every 6 hours as needed for pain 11:38 08/11/2009 by Samuel Jester - DO PSA, Dr. Medication disposition: Medications Medication [Medication] Dosage Frequency Last Dose Medication disposition PCP contact Actos Oral 45mg  once a day continue aspirin Oral 81mg  once a day continue ferrous sulfate Oral 325mg  twice a day continue JanUVIA Oral 100mg  once a day continue lisinopril Oral 5mg  once a day continue metFORMIN Oral 500mg  once a day continue Miralax Oral 17grams specialized dosing continue Parlodel Oral 1.25mg  at bed time continue PriLOSEC Oral 20mg  once a day continue PROzac Oral 40mg  once a day continue Questran Oral 4 grams once a day continue Robaxin Oral 500mg  as needed continue senna Oral 1 tab as needed continue simvastatin Oral 80mg  at bed time continue triazolam Oral 0.5mg  at bed time continue ZeTIA Oral 10mg  twice a day continue 11:38 08/11/2009 by Samuel Jester - DO PSA,  Dr. Discharge: Discharge Instructions: *resource guide, abrasion, contusion, rib fracture Append a Note to Discharge Instructions: NOTE - Take the prescription as directed. DO NOT TAKE YOUR METFORMIN FOR THE NEXT 48 HOURS. Apply moist heat or ice to the area(s) of discomfort, for 15 minutes at a time, several times per day for the next few days. Do not fall asleep on a heating or ice pack. Use the incentive spirometer, as instructed in the Emergency Department, 3 times per hour every hour while you are awake for the  next 2 to 3 weeks. Call your regular medical doctor today to schedule a follow up appointment this week. Return to the Emergency Department immediately if worsening. 7 Dayven, Linsley - MRN: [id number] Acct#: [id number] Referral/Appointment Refer Patient To: Phone Number: Follow-up in Appointment Details: Doctor, Your Own Everardo All - MD, Cleophas Dunker - Endocrinology [phone] Drug Instructions: pain acetaminophen hydrocodone 11:40 08/11/2009 by Samuel Jester - DO PSA, Dr. Milinda Pointer electronically signed by Responsible Physician 07:44 08/12/2009 by Samuel Jester - DO PSA, Dr. Cindra Presume

## 2010-07-02 NOTE — Progress Notes (Signed)
Summary: [facility]  Phone Note Outgoing Call   Summary of Call: Faxed completed paperwork to [employer]. Sent a copy to be scanned also. Initial call taken by: Josph Macho RMA,  July 21, 2009 8:57 AM

## 2010-07-02 NOTE — Miscellaneous (Signed)
Summary: FL2 form/[facility]  FL2 form/[facility]   Imported By: Sherian Rein 11/07/2009 09:44:30  _____________________________________________________________________  External Attachment:    Type:   Image     Comment:   External Document

## 2010-07-02 NOTE — Miscellaneous (Signed)
Summary: Medication order/[facility]  Medication order/[facility]   Imported By: Sherian Rein 11/07/2009 09:41:56  _____________________________________________________________________  External Attachment:    Type:   Image     Comment:   External Document

## 2010-07-02 NOTE — Assessment & Plan Note (Signed)
Summary: CPX/#/CD   Vital Signs:  Patient profile:   77 year old male Height:      72 inches (182.88 cm) Weight:      203.13 pounds (92.33 kg) O2 Sat:      98 % on Room air Temp:     97.8 degrees F (36.56 degrees C) oral Pulse rate:   70 / minute BP sitting:   142 / 70  (left arm) Cuff size:   large  Vitals Entered By: Josph Macho RMA (July 17, 2009 11:17 AM)  O2 Flow:  Room air CC: Physical/ Pt states head hurts X1week/ Pt states sleeping pill isn't helping/ CF Is Patient Diabetic? Yes   Primary Provider:  Everardo All  CC:  Physical/ Pt states head hurts X1week/ Pt states sleeping pill isn't helping/ CF.  History of Present Illness: here for regular wellness examination.  He's feeling pretty well in general, except as noted below, and does not drink or smoke.   Current Medications (verified): 1)  Simvastatin 80 Mg  Tabs (Simvastatin) .... Take 1 By Mouth Qhs 2)  Fluoxetine Hcl 40 Mg  Caps (Fluoxetine Hcl) .... Take 1 By Mouth Qd 3)  Zetia 10 Mg Tabs (Ezetimibe) .Marland Kitchen.. 1 Qd 4)  Aspirin Adult Low Strength 81 Mg Tbec (Aspirin) .Marland Kitchen.. 1 Qd 5)  Miralax  Powd (Polyethylene Glycol 3350) .Marland Kitchen.. 18 Grams Three Times Weekly (M,w,f) 6)  Januvia 100 Mg Tabs (Sitagliptin Phosphate) .Marland Kitchen.. 1 Qam 7)  Onetouch Ultra Test  Strp (Glucose Blood) .... Two Times A Day, and Lancets,and Alcohol Swabs 250.00 8)  Onetouch Ultra System W/device Kit (Blood Glucose Monitoring Suppl) .... As Dir 9)  Actos 45 Mg Tabs (Pioglitazone Hcl) .Marland Kitchen.. 1 Qam 10)  4-Point Cane .Marland Kitchen.. 331.0 11)  Omeprazole 20 Mg Cpdr (Omeprazole) .Marland Kitchen.. 1 Qd 12)  Triazolam 0.25 Mg Tabs (Triazolam) .Marland Kitchen.. 1 Qhs 13)  Metformin Hcl 500 Mg Xr24h-Tab (Metformin Hcl) .... 2 Qam 14)  Methocarbamol 500 Mg Tabs (Methocarbamol) .Marland Kitchen.. 1 Q6h As Needed Cramps 15)  Cholestyramine 4 Gm/dose Powd (Cholestyramine) .... 4 Grams Once Daily 16)  Lisinopril 5 Mg Tabs (Lisinopril) .Marland Kitchen.. 1 Once Daily  Allergies (verified): 1)  ! * Altace  Family  History: Reviewed history from 11/15/2007 and no changes required. no dm  Social History: Reviewed history from 03/03/2009 and no changes required. retired lives "[facility]" separated 7th grade education--illiterate Never Smoked Alcohol use-no Drug use--no more Regular exercise-no  Review of Systems  The patient denies fever, weight loss, weight gain, vision loss, decreased hearing, chest pain, syncope, dyspnea on exertion, abdominal pain, melena, hematochezia, severe indigestion/heartburn, hematuria, and suspicious skin lesions.         denies decreased urinary stream.  Physical Exam  General:  normal appearance.   Head:  head: no deformity eyes: no periorbital swelling, no proptosis external nose and ears are normal mouth: no lesion seen Neck:  Supple without thyroid enlargement or tenderness. No cervical lymphadenopathy, neck masses or tracheal deviation.  Chest Wall:  there is a median sternotomy scar. Lungs:  Clear to auscultation bilaterally. Normal respiratory effort.  Heart:  Regular rate and rhythm without murmurs or gallops noted. Normal S1,S2.   Abdomen:  abdomen is soft, nontender.  no hepatosplenomegaly.   not distended.  no hernia  Prostate:  Normal size prostate without masses or tenderness.  Msk:  muscle bulk and strength are grossly normal.  no obvious joint swelling.  gait is slow and steady with a cane  Extremities:  no  deformity.  no ulcer on the feet.  feet are of normal color and temp.  there is a vein hervest site at the left leg.  the right leg has a deformity c/w old fx.   1+ right pedal edema and 1+ left pedal edema.   Neurologic:  cn 2-12 grossly intact.   readily moves all 4's.   sensation is intact to touch on the feet  Skin:  normal texture and temp.  no rash.  not diaphoretic  Cervical Nodes:  No significant adenopathy.  Additional Exam:  SEPARATE EVALUATION FOLLOWS--EACH PROBLEM HERE IS NEW, NOT RESPONDING TO TREATMENT, OR POSES  SIGNIFICANT RISK TO THE PATIENT'S HEALTH: HISTORY OF THE PRESENT ILLNESS: pt states 1 month of intermittent blurry vision, and associated slight headache at the left parietal area. he says triazolam does not help him sleep. he says he likes his dm meds he does not take iron pills PAST MEDICAL HISTORY reviewed and up to date today REVIEW OF SYSTEMS: denies depression and hypoglycemia PHYSICAL EXAMINATION: dorsalis pedis intact bilat.  no carotid bruit normal external and internal exam.  heme neg does not appear anxious nor depressed LAB/XRAY RESULTS: Iron Saturation      [L]  14.6 %                      20.0-50.0 Hemoglobin A1C       [H]  7.5 %   Hemoglobin           [L]  11.1 g/dL     81.1-91.4 Hematocrit           [L]  33.0 %    IMPRESSION: iron-deficiency anemia dm, needs increased rx blurry vision insomnia, needs increased rx PLAN: see instructions   Impression & Recommendations:  Problem # 1:  ROUTINE GENERAL MEDICAL EXAM@HEALTH  CARE FACL (ICD-V70.0)  Medications Added to Medication List This Visit: 1)  Triazolam 0.25 Mg Tabs (Triazolam) .... 2 qhs 2)  Bromocriptine Mesylate 2.5 Mg Tabs (Bromocriptine mesylate) .... 1/2 tab qhs  Other Orders: Gastroenterology Referral (GI) Ophthalmology Referral (Ophthalmology) Prescription Created Electronically [phone]) Est. Patient Level IV (62130) Est. Patient 65& > (86578)   Patient Instructions: 1)  i have scheduled you for a colonoscopy.  you will be called for an appintment. 2)  iron:  take 2 pills per day (non-prescription). 3)  add bromicriptine 1/2 of 2.5 mg pill at night 4)  return 3 months 5)  refer eye dr--you will be called for an appointment 6)  increase triazolam to 2x0.25 mg at bedtime as needed sleep. 7)  Please schedule a follow-up appointment in 3 months. Prescriptions: TRIAZOLAM 0.25 MG TABS (TRIAZOLAM) 2 qhs  #60 x 2   Entered and Authorized by:   Minus Breeding MD   Signed by:   Minus Breeding MD on  07/17/2009   Method used:   Print then Give to Patient   RxID:   [id number] BROMOCRIPTINE MESYLATE 2.5 MG TABS (BROMOCRIPTINE MESYLATE) 1/2 tab qhs  #15 x 11   Entered and Authorized by:   Minus Breeding MD   Signed by:   Minus Breeding MD on 07/17/2009   Method used:   Electronically to        [facility] (retail)       [address]       [place], [place]  [address]       Ph: [phone]  Fax: [phone]   RxID:   [id number]

## 2010-07-02 NOTE — Progress Notes (Signed)
Summary: [facility]  Phone Note Other Incoming   Summary of Call: Faxed completed paperwork and sent a copy to be scanned. Initial call taken by: Josph Macho RMA,  September 10, 2009 10:31 AM

## 2010-07-02 NOTE — Progress Notes (Signed)
Summary: Patient changing endo provider.  Phone Note Outgoing Call   Call placed by: Daphane Shepherd,  June 17, 2010 3:24 PM Call placed to: Patient Summary of Call: Called patient spouse to schedule a follow up visit with Dr. Everardo All and she informed me that the patient will no longer continue seeing Dr. Everardo All because he is not in the [facility]. Spouse stated that patient is scheduled to see a doctor on [address]. Initial call taken by: Daphane Shepherd,  June 17, 2010 3:26 PM

## 2010-07-02 NOTE — Assessment & Plan Note (Signed)
Summary: ER FU/ SWELLING/ BROKEN RIB?/ BACK PAIN /NWS   Vital Signs:  Patient profile:   77 year old male Height:      67 inches Weight:      200 pounds BMI:     31.44 O2 Sat:      96 % on Room air Temp:     97.5 degrees F oral Pulse rate:   47 / minute BP sitting:   130 / 70  (left arm) Cuff size:   large  Vitals Entered ByZella Ball Ewing (August 13, 2009 2:41 PM)  O2 Flow:  Room air CC: ER Followup/RE   Primary Care Provider:  Everardo All  CC:  ER Followup/RE.  History of Present Illness: here with pleuritic insp CP not controlled after the fall and broken rib noted last Mon AM , pain still mod to severe;  had CT scan, and was supposed to come here before metformin again; lives at the [facility];  has vicodin 5/325  - 1 q 6 hrs which does not seem to help at all per pt - gets spasms of pain every time breathes deep or has to get up to the BR, and pain seems to make appetitite go down adn seems to make the chest pain worse as well;  no n/v, abd pain o/w,  recent abd ct neg.  Fall occurred mon am with just stadning up and lost balance.  has 4 pronged at the assisted living, a walker iwth wheels, and occasionally the staff will push him in the wheelchair, since he has had increased lower back pain intermittent that jsut "grabs me"  and reproducible with walking  - only has to walk 10 ft before pain starts.  pain radiates to the LLE to the ankle, less oftien on the right to the distal leg; has some weakness but not sure how much is acute;  no numbness.  No fever, wt loss, bowel or bladder chagnes.  Also had a fall in feb 2011 as well.    Problems Prior to Update: 1)  Benign Prostatic Hypertrophy  (ICD-600.00) 2)  Blurred Vision  (ICD-368.8) 3)  Anemia, Iron Deficiency  (ICD-280.9) 4)  Chest Pain  (ICD-786.50) 5)  Polyuria  (ICD-788.42) 6)  Back Pain, Lumbar  (ICD-724.2) 7)  Abdominal Pain, Unspecified Site  (ICD-789.00) 8)  Erectile Dysfunction, Organic   (ICD-607.84) 9)  Hypertonicity of Bladder  (ICD-596.51) 10)  Closed Fracture Unspecified Part Fibula W/tibia  (ICD-823.82) 11)  Prostate Asymmetry  (ICD-602.8) 12)  Hepatotoxicity, Drug-induced, Risk of  (ICD-V58.69) 13)  Hypercholesterolemia  (ICD-272.0) 14)  Insomnia  (ICD-780.52) 15)  Bradycardia  (ICD-427.89) 16)  Routine General Medical Exam@health  Care Facl  (ICD-V70.0) 17)  Psa, Increased  (ICD-790.93) 18)  Peripheral Vascular Disease  (ICD-443.9) 19)  Osteoarthritis  (ICD-715.90) 20)  Hypertension  (ICD-401.9) 21)  Diabetes Mellitus, Type II  (ICD-250.00) 22)  Depression  (ICD-311) 23)  Dementia  (ICD-294.8) 24)  Coronary Artery Disease  (ICD-414.00) 25)  Anxiety  (ICD-300.00)  Medications Prior to Update: 1)  Simvastatin 80 Mg  Tabs (Simvastatin) .... Take 1 By Mouth Qhs 2)  Fluoxetine Hcl 40 Mg  Caps (Fluoxetine Hcl) .... Take 1 By Mouth Qd 3)  Zetia 10 Mg Tabs (Ezetimibe) .Marland Kitchen.. 1 Qd 4)  Aspirin Adult Low Strength 81 Mg Tbec (Aspirin) .Marland Kitchen.. 1 Qd 5)  Miralax  Powd (Polyethylene Glycol 3350) .Marland Kitchen.. 18 Grams Three Times Weekly (M,w,f) 6)  Januvia 100 Mg Tabs (Sitagliptin Phosphate) .Marland Kitchen.. 1 Qam  7)  Onetouch Ultra Test  Strp (Glucose Blood) .... Two Times A Day, and Lancets,and Alcohol Swabs 250.00 8)  Actos 45 Mg Tabs (Pioglitazone Hcl) .Marland Kitchen.. 1 Qam 9)  4-Point Cane .Marland Kitchen.. 331.0 10)  Omeprazole 20 Mg Cpdr (Omeprazole) .Marland Kitchen.. 1 Qd 11)  Triazolam 0.25 Mg Tabs (Triazolam) .... 2 Qhs 12)  Metformin Hcl 500 Mg Xr24h-Tab (Metformin Hcl) .... 2 Qam 13)  Methocarbamol 500 Mg Tabs (Methocarbamol) .Marland Kitchen.. 1 Q6h As Needed Cramps 14)  Cholestyramine 4 Gm/dose Powd (Cholestyramine) .... 4 Grams Once Daily 15)  Lisinopril 5 Mg Tabs (Lisinopril) .Marland Kitchen.. 1 Once Daily 16)  Bromocriptine Mesylate 2.5 Mg Tabs (Bromocriptine Mesylate) .... 1/2 Tab Qhs  Current Medications (verified): 1)  Simvastatin 80 Mg  Tabs (Simvastatin) .... Take 1 By Mouth Qhs 2)  Fluoxetine Hcl 40 Mg  Caps (Fluoxetine Hcl) .... Take 1  By Mouth Qd 3)  Zetia 10 Mg Tabs (Ezetimibe) .Marland Kitchen.. 1 Qd 4)  Aspirin Adult Low Strength 81 Mg Tbec (Aspirin) .Marland Kitchen.. 1 Qd 5)  Miralax  Powd (Polyethylene Glycol 3350) .Marland Kitchen.. 18 Grams Three Times Weekly (M,w,f) 6)  Januvia 100 Mg Tabs (Sitagliptin Phosphate) .Marland Kitchen.. 1 Qam 7)  Onetouch Ultra Test  Strp (Glucose Blood) .... Two Times A Day, and Lancets,and Alcohol Swabs 250.00 8)  Actos 45 Mg Tabs (Pioglitazone Hcl) .Marland Kitchen.. 1 Qam 9)  4-Point Cane .Marland Kitchen.. 331.0 10)  Omeprazole 20 Mg Cpdr (Omeprazole) .Marland Kitchen.. 1 Qd 11)  Triazolam 0.25 Mg Tabs (Triazolam) .... 2 Qhs 12)  Metformin Hcl 500 Mg Xr24h-Tab (Metformin Hcl) .... 2 Qam 13)  Methocarbamol 500 Mg Tabs (Methocarbamol) .Marland Kitchen.. 1 Q6h As Needed Cramps 14)  Cholestyramine 4 Gm/dose Powd (Cholestyramine) .... 4 Grams Once Daily 15)  Lisinopril 5 Mg Tabs (Lisinopril) .Marland Kitchen.. 1 Once Daily 16)  Bromocriptine Mesylate 2.5 Mg Tabs (Bromocriptine Mesylate) .... 1/2 Tab Qhs 17)  Oxycodone Hcl 5 Mg Caps (Oxycodone Hcl) .Marland Kitchen.. 1 - 2 By Mouth Q 6 Hrs As Needed Pain  Allergies (verified): 1)  ! * Altace  Past History:  Past Surgical History: Last updated: 12/08/2006 Coronary artery bypass graft S/P Left Rotator Cuff Repair EKG (03/08/2006) Carotid Duplex (12/28/2005)  Social History: Last updated: 03/03/2009 retired lives [facility] separated 7th grade education--illiterate Never Smoked Alcohol use-no Drug use--no more Regular exercise-no  Risk Factors: Alcohol Use: 0 (08/29/2008) Exercise: no (08/29/2008)  Risk Factors: Smoking Status: never (08/29/2008)  Past Medical History: Anxiety Coronary artery disease Dementia Depression Diabetes mellitus, type II Hypertension Osteoarthritis Peripheral vascular disease History Cocaine abuse ? Alcholism Leukopenia Carotid Stenosis Multinodular Goiter Benign prostatic hypertrophy  Review of Systems       all otherwise negative per pt -    Physical Exam  General:  alert and overweight-appearing.    Head:  normocephalic and atraumatic.   Eyes:  vision grossly intact, pupils equal, and pupils round.   Ears:  R ear normal and L ear normal.   Nose:  no external deformity and no nasal discharge.   Mouth:  no gingival abnormalities and pharynx pink and moist.   Neck:  supple and no masses.   Lungs:  normal respiratory effort and normal breath sounds.   Heart:  normal rate and regular rhythm.   Abdomen:  soft and non-tender.   Msk:  spine nontender, no paravertebral tender Extremities:  no edema,  ulcers Neurologic:  cranial nerves II-XII intact and strength normal in all extremities though poor effort on LE strenght testingcranial nerves II-XII intact, strength normal in all  extremities, and DTRs symmetrical and normal.     Impression & Recommendations:  Problem # 1:  CHEST PAIN (ICD-786.50) due to rib fx - for pain control, no further eval needed at this time  Problem # 2:  BACK PAIN, LUMBAR (ICD-724.2)  His updated medication list for this problem includes:    Aspirin Adult Low Strength 81 Mg Tbec (Aspirin) .Marland Kitchen... 1 qd    Methocarbamol 500 Mg Tabs (Methocarbamol) .Marland Kitchen... 1 q6h as needed cramps    Oxycodone Hcl 5 Mg Caps (Oxycodone hcl) .Marland Kitchen... 1 - 2 by mouth q 6 hrs as needed pain with increased radicular symptoms and possible LE symtpoms and recurrent falls-   for MRI, and ortho referral, suspecct by hx ? spinal stenosis  Orders: Radiology Referral (Radiology) Orthopedic Surgeon Referral (Ortho Surgeon)  Problem # 3:  HYPERTENSION (ICD-401.9)  His updated medication list for this problem includes:    Lisinopril 5 Mg Tabs (Lisinopril) .Marland Kitchen... 1 once daily  BP today: 130/70 Prior BP: 142/70 (07/17/2009)  Prior 10 Yr Risk Heart Disease: N/A (08/29/2008)  Labs Reviewed: K+: 4.0 (07/01/2009) Creat: : 1.4 (07/01/2009)   Chol: 132 (07/01/2009)   HDL: 51.50 (07/01/2009)   LDL: 69 (07/01/2009)   TG: 56.0 (07/01/2009) stable overall by hx and exam, ok to continue meds/tx as is    Complete Medication List: 1)  Simvastatin 80 Mg Tabs (Simvastatin) .... Take 1 by mouth qhs 2)  Fluoxetine Hcl 40 Mg Caps (Fluoxetine hcl) .... Take 1 by mouth qd 3)  Zetia 10 Mg Tabs (Ezetimibe) .Marland Kitchen.. 1 qd 4)  Aspirin Adult Low Strength 81 Mg Tbec (Aspirin) .Marland Kitchen.. 1 qd 5)  Miralax Powd (Polyethylene glycol 3350) .Marland Kitchen.. 18 grams three times weekly (m,w,f) 6)  Januvia 100 Mg Tabs (Sitagliptin phosphate) .Marland Kitchen.. 1 qam 7)  Onetouch Ultra Test Strp (Glucose blood) .... Two times a day, and lancets,and alcohol swabs 250.00 8)  Actos 45 Mg Tabs (Pioglitazone hcl) .Marland Kitchen.. 1 qam 9)  4-point Cane  .Marland Kitchen.. 331.0 10)  Omeprazole 20 Mg Cpdr (Omeprazole) .Marland Kitchen.. 1 qd 11)  Triazolam 0.25 Mg Tabs (Triazolam) .... 2 qhs 12)  Metformin Hcl 500 Mg Xr24h-tab (Metformin hcl) .... 2 qam 13)  Methocarbamol 500 Mg Tabs (Methocarbamol) .Marland Kitchen.. 1 q6h as needed cramps 14)  Cholestyramine 4 Gm/dose Powd (Cholestyramine) .... 4 grams once daily 15)  Lisinopril 5 Mg Tabs (Lisinopril) .Marland Kitchen.. 1 once daily 16)  Bromocriptine Mesylate 2.5 Mg Tabs (Bromocriptine mesylate) .... 1/2 tab qhs 17)  Oxycodone Hcl 5 Mg Caps (Oxycodone hcl) .Marland Kitchen.. 1 - 2 by mouth q 6 hrs as needed pain  Patient Instructions: 1)  Please take all new medications as prescribed - the oxycodone 2)  stop the hydrocodone 3)  You will be contacted about the referral(s) to: MRI for the lower back, and orthopedic evaluation (after which you may need Physical Therapy) 4)  Please schedule an appointment with your primary doctor in 4 wks Prescriptions: OXYCODONE HCL 5 MG CAPS (OXYCODONE HCL) 1 - 2 by mouth q 6 hrs as needed pain  #100 x 0   Entered and Authorized by:   Corwin Levins MD   Signed by:   Corwin Levins MD on 08/13/2009   Method used:   Print then Give to Patient   RxID:   [id number]

## 2010-07-02 NOTE — Miscellaneous (Signed)
Summary: Physician Visit form/[facility]  [facility]   Imported By: Sherian Rein 03/24/2010 12:21:04  _____________________________________________________________________  External Attachment:    Type:   Image     Comment:   External Document

## 2010-07-02 NOTE — Letter (Signed)
Summary: Surgical approval/[facility]  Surgical approval/[facility]   Imported By: Lester Pitkin 11/04/2009 10:25:46  _____________________________________________________________________  External Attachment:    Type:   Image     Comment:   External Document

## 2010-07-02 NOTE — Miscellaneous (Signed)
Summary: For review/[facility]  For [employer]   Imported By: Lester Premont 02/12/2010 10:41:20  _____________________________________________________________________  External Attachment:    Type:   Image     Comment:   External Document

## 2010-07-02 NOTE — Letter (Signed)
[facility]   Imported By: Sherian Rein 08/30/2009 08:50:55  _____________________________________________________________________  External Attachment:    Type:   Image     Comment:   External Document

## 2010-07-02 NOTE — Miscellaneous (Signed)
  Medications Added TEMAZEPAM 15 MG CAPS (TEMAZEPAM) 1 at bedtime as needed for sleep       Clinical Lists Changes  Medications: Removed medication of AZITHROMYCIN 250 MG TABS (AZITHROMYCIN) 2po qd for 1 day, then 1po qd for 4days, then stop Removed medication of HYDROCODONE-HOMATROPINE 5-1.5 MG/5ML SYRP (HYDROCODONE-HOMATROPINE) 1 tsp by mouth q 6 hrs as needed cough Removed medication of PREDNISONE 10 MG TABS (PREDNISONE) 3po qd for 3days, then 2po qd for 3days, then 1po qd for 3days, then stop Removed medication of TRIAZOLAM 0.25 MG TABS (TRIAZOLAM) 2 qhs Added new medication of TEMAZEPAM 15 MG CAPS (TEMAZEPAM) 1 at bedtime as needed for sleep

## 2010-07-02 NOTE — Miscellaneous (Signed)
Summary: FL2 form/[facility]  [facility] form/[facility]   Imported By: Sherian Rein 07/23/2009 08:32:19  _____________________________________________________________________  External Attachment:    Type:   Image     Comment:   External Document

## 2010-07-02 NOTE — Miscellaneous (Signed)
Summary: Order for general diet/[facility]  Order for general diet/[facility]   Imported By: Sherian Rein 09/08/2009 09:44:06  _____________________________________________________________________  External Attachment:    Type:   Image     Comment:   External Document

## 2010-07-02 NOTE — Miscellaneous (Signed)
Summary: Diet Order/[facility]  Diet Order/[facility]   Imported By: Sherian Rein 06/05/2010 12:31:19  _____________________________________________________________________  External Attachment:    Type:   Image     Comment:   External Document

## 2010-07-02 NOTE — Assessment & Plan Note (Signed)
Summary: 3 MTH FU  STC   Vital Signs:  Patient profile:   77 year old male Height:      72 inches (182.88 cm) Weight:      202.25 pounds (91.93 kg) O2 Sat:      97 % on Room air Temp:     97.9 degrees F (36.61 degrees C) oral Pulse rate:   74 / minute BP sitting:   158 / 72  (left arm) Cuff size:   large  Vitals Entered By: Josph Macho CMA (July 01, 2009 1:22 PM)  O2 Flow:  Room air CC: 3 month follow up/ pt states he takes the "little" pills but not the "big" ones/ CF Is Patient Diabetic? Yes   Primary Provider:  Everardo All  CC:  3 month follow up/ pt states he takes the "little" pills but not the "big" ones/ CF.  History of Present Illness: pt could not tolerate colestid due to the size of the pill. no cbg record, but states cbg's are checked at his facility. pt says "i seldom have chest pain."  Current Medications (verified): 1)  Simvastatin 80 Mg  Tabs (Simvastatin) .... Take 1 By Mouth Qhs 2)  Fluoxetine Hcl 40 Mg  Caps (Fluoxetine Hcl) .... Take 1 By Mouth Qd 3)  Colestipol Hcl 1 Gm  Tabs (Colestipol Hcl) .... 5 Qd 4)  Zetia 10 Mg Tabs (Ezetimibe) .Marland Kitchen.. 1 Qd 5)  Aspirin Adult Low Strength 81 Mg Tbec (Aspirin) .Marland Kitchen.. 1 Qd 6)  Miralax  Powd (Polyethylene Glycol 3350) .Marland Kitchen.. 18 Grams Three Times Weekly (M,w,f) 7)  Januvia 100 Mg Tabs (Sitagliptin Phosphate) .Marland Kitchen.. 1 Qam 8)  Onetouch Ultra Test  Strp (Glucose Blood) .... Two Times A Day, and Lancets,and Alcohol Swabs 250.00 9)  Onetouch Ultra System W/device Kit (Blood Glucose Monitoring Suppl) .... As Dir 10)  Actos 45 Mg Tabs (Pioglitazone Hcl) .Marland Kitchen.. 1 Qam 11)  4-Point Cane .Marland Kitchen.. 331.0 12)  Omeprazole 20 Mg Cpdr (Omeprazole) .Marland Kitchen.. 1 Qd 13)  Triazolam 0.25 Mg Tabs (Triazolam) .Marland Kitchen.. 1 Qhs 14)  Metformin Hcl 500 Mg Xr24h-Tab (Metformin Hcl) .... 2 Qam 15)  Methocarbamol 500 Mg Tabs (Methocarbamol) .Marland Kitchen.. 1 Q6h As Needed Cramps  Allergies (verified): 1)  ! * Altace  Past History:  Past Medical History: Last updated:  12/08/2006 Anxiety Coronary artery disease Dementia Depression Diabetes mellitus, type II Hypertension Osteoarthritis Peripheral vascular disease History Cocaine abuse ? Alcholism Leukopenia Carotid Stenosis Multinodular Goiter  Review of Systems  The patient denies hypoglycemia.         denies dysphagia  Physical Exam  General:  normal appearance.   Psych:  Alert and cooperative; normal mood and affect;  Additional Exam:  LDL Cholesterol     69 mg/dl Hemoglobin Z6X       [H]  7.5 %    Impression & Recommendations:  Problem # 1:  DIABETES MELLITUS, TYPE II (ICD-250.00) better control would involve addition of an agent which could cause hypoglycemia.  Problem # 2:  CHEST PAIN (ICD-786.50) uncertain etiology.  Problem # 3:  HYPERCHOLESTEROLEMIA (ICD-272.0) well-controlled  Problem # 4:  HYPERTENSION (ICD-401.9) needs increased rx  Medications Added to Medication List This Visit: 1)  Cholestyramine 4 Gm/dose Powd (Cholestyramine) .... 4 grams once daily 2)  Lisinopril 5 Mg Tabs (Lisinopril) .Marland Kitchen.. 1 once daily  Other Orders: EKG w/ Interpretation (93000) Cardiolite (Cardiolite) TLB-Lipid Panel (80061-LIPID) TLB-CBC Platelet - w/Differential (85025-CBCD) TLB-IBC Pnl (Iron/FE;Transferrin) (83550-IBC) TLB-BMP (Basic Metabolic Panel-BMET) (80048-METABOL) TLB-A1C / Hgb  A1C (Glycohemoglobin) (83036-A1C) TLB-Hepatic/Liver Function Pnl (80076-HEPATIC) TLB-Microalbumin/Creat Ratio, Urine (82043-MALB) TLB-PSA (Prostate Specific Antigen) (84153-PSA) TLB-Udip w/ Micro (81001-URINE) Est. Patient Level IV (16109)  Patient Instructions: 1)  take colestyramine powder 4 grams per day (with any meal, but not with other meds).   2)  add lisinopril 5 mg once daily. 3)  tests are being ordered for you today.  a few days after the test(s), please call [phone] to hear your test results. 4)  please schedule a physical in 2 weeks. 5)  you will be called for an appointment for a  heart test.

## 2010-07-02 NOTE — Progress Notes (Signed)
  Phone Note Refill Request Message from:  Fax from Pharmacy on August 21, 2009 9:38 AM  Refill to [facility] in [facility]  Initial call taken by: Josph Macho RMA,  August 21, 2009 9:39 AM    Prescriptions: BROMOCRIPTINE MESYLATE 2.5 MG TABS (BROMOCRIPTINE MESYLATE) 1/2 tab qhs  #15 x 10   Entered by:   Josph Macho RMA   Authorized by:   Minus Breeding MD   Signed by:   Josph Macho RMA on 08/21/2009   Method used:   Faxed to ...       [facility] (mail-order)             , [place]         Ph:        Fax: [phone]   RxID:   [id number]

## 2010-07-02 NOTE — Progress Notes (Signed)
Summary: [facility]  Phone Note Outgoing Call   Summary of Call: Received paperwork from [facility]. Spoke with pts spouse and she stated not to send the meds to this pharmacy at [facility], [place]. Informed pts spouse that this would be disregarded and noted. Initial call taken by: Josph Macho RMA,  July 30, 2009 8:49 AM

## 2010-07-02 NOTE — Medication Information (Signed)
Summary: [employer]   Imported By: Sherian Rein 05/13/2010 10:21:10  _____________________________________________________________________  External Attachment:    Type:   Image     Comment:   External Document

## 2010-07-02 NOTE — Miscellaneous (Signed)
Summary: Physicians order d/c Colestipol/[facility]   Physicians order d/c Colestipol/[facility]   Imported By: Sherian Rein 06/30/2009 10:24:19  _____________________________________________________________________  External Attachment:    Type:   Image     Comment:   External Document

## 2010-07-02 NOTE — Miscellaneous (Signed)
Summary: Medication orders/[facility]  Medication orders/[facility]   Imported By: Sherian Rein 06/23/2010 11:32:37  _____________________________________________________________________  External Attachment:    Type:   Image     Comment:   External Document

## 2010-07-02 NOTE — Assessment & Plan Note (Signed)
Summary: Cardiology Nuclear Study  Nuclear Med Background Indications for Stress Test: Evaluation for Ischemia, Graft Patency, Stent Patency   History: CABG, Echo, Heart Catheterization, Myocardial Infarction, Myocardial Perfusion Study, Stents  History Comments: '97 NSTEMI>Stent CFX. '97 Echo:EF=55-60%. '98 CABG x 4. '00 Cath: Patent Grafts, Med Tx. 1/07 MPS: NL Study, EF=44%.  Symptoms: Chest Pain, Palpitations, SOB    Nuclear Pre-Procedure Cardiac Risk Factors: Carotid Disease, Hypertension, Lipids, NIDDM, PVD Caffeine/Decaff Intake: None NPO After: 9:30 PM Lungs: clear IV 0.9% NS with Angio Cath: 22g     IV Site: (R) Wrist IV Started by: Irean Hong RN Chest Size (in) 46     Height (in): 72 Weight (lb): 200 BMI: 27.22  Nuclear Med Study 1 or 2 day study:  1 day     Stress Test Type:  Eugenie Birks Reading MD:  Willa Rough, MD     Referring MD:  S.Ellison Resting Radionuclide:  Technetium 1m Tetrofosmin     Resting Radionuclide Dose:  11.0 mCi  Stress Radionuclide:  Technetium 48m Tetrofosmin     Stress Radionuclide Dose:  32.0 mCi   Stress Protocol   Lexiscan: 0.4 mg   Stress Test Technologist:  Milana Na EMT-P     Nuclear Technologist:  Harlow Asa CNMT  Rest Procedure  Myocardial perfusion imaging was performed at rest 45 minutes following the intravenous administration of Myoview Technetium 16m Tetrofosmin.  Stress Procedure  The patient received IV Lexiscan 0.4 mg over 15-seconds.  Myoview injected at 30-seconds.  There were no significant changes and occ pacs with infusion.  Quantitative spect images were obtained after a 45 minute delay.  QPS Raw Data Images:  Normal; no motion artifact; normal heart/lung ratio. Stress Images:  There is normal uptake in all areas. Rest Images:  Normal homogeneous uptake in all areas of the myocardium. Subtraction (SDS):  No evidence of ischemia. Transient Ischemic Dilatation:  .95  (Normal <1.22)  Lung/Heart  Ratio:  .29  (Normal <0.45)  Quantitative Gated Spect Images QGS EDV:  122 ml QGS ESV:  52 ml QGS EF:  57 % QGS cine images:  Decreased motion at the apex. Septal dyssynergy c/w prior CABG  Findings Low risk nuclear study      Overall Impression  Exercise Capacity: Lexiscan BP Response: Normal blood pressure response. Clinical Symptoms: SOB ECG Impression: No significant ST segment change suggestive of ischemia. Overall Impression: No scar or ischemia. Wall motion abnormalities as noted  Appended Document: Cardiology Nuclear Study please leave message on phone tree--normal  Appended Document: Cardiology Nuclear Study left message on pt

## 2010-07-02 NOTE — Miscellaneous (Signed)
Summary: Order/[facility]   Imported By: Lester [place] 05/08/2010 08:55:57  _____________________________________________________________________  External Attachment:    Type:   Image     Comment:   External Document

## 2010-07-02 NOTE — Miscellaneous (Signed)
Summary: Orders / [facility]  Orders / [facility]   Imported By: Lennie Odor 05/18/2010 14:25:03  _____________________________________________________________________  External Attachment:    Type:   Image     Comment:   External Document

## 2010-07-02 NOTE — Progress Notes (Signed)
Summary: Order  Phone Note From Other Clinic Call back at [phone]   Caller: Michaelyn Barter RN Summary of Call: Lupita Leash from [facility] called requesting an order for pt for PT, Speech Therapy, and Occupational Therapy. Order to be faxed to [phone] Initial call taken by: Brenton Grills CMA Duncan Dull),  April 17, 2010 3:58 PM  Follow-up for Phone Call        i printed Follow-up by: Minus Breeding MD,  April 17, 2010 4:09 PM  Additional Follow-up for Phone Call Additional follow up Details #1::        order faxed to [facility] Additional Follow-up by: Brenton Grills CMA Duncan Dull),  April 17, 2010 4:26 PM    New/Updated Medications: * PT, OT, AND SPEECH THERAPY 331.0 Prescriptions: PT, OT, AND SPEECH THERAPY 331.0  #0 x 0   Entered and Authorized by:   Minus Breeding MD   Signed by:   Minus Breeding MD on 04/17/2010   Method used:   Print then Give to Patient   RxID:   [id number]

## 2010-07-02 NOTE — Miscellaneous (Signed)
   Clinical Lists Changes  Medications: Removed medication of METHOCARBAMOL 500 MG TABS (METHOCARBAMOL) 1 q6h as needed cramps Removed medication of OXYCODONE HCL 5 MG CAPS (OXYCODONE HCL) 1 - 2 by mouth q 6 hrs as needed pain

## 2010-07-02 NOTE — Miscellaneous (Signed)
Summary: D/c order/[facility]  D/c order/[facility]   Imported By: Lester [place] 06/15/2010 07:12:09  _____________________________________________________________________  External Attachment:    Type:   Image     Comment:   External Document

## 2010-08-10 LAB — BASIC METABOLIC PANEL
BUN: 19 mg/dL (ref 6–23)
Calcium: 9.2 mg/dL (ref 8.4–10.5)
GFR calc non Af Amer: 52 mL/min — ABNORMAL LOW (ref >60)
Potassium: 4.3 mEq/L (ref 3.5–5.1)
Sodium: 138 mEq/L (ref 135–145)

## 2010-08-10 LAB — URINALYSIS, ROUTINE W REFLEX MICROSCOPIC
Bilirubin Urine: NEGATIVE
Nitrite: NEGATIVE
Specific Gravity, Urine: 1.019 (ref 1.005–1.030)
Urobilinogen, UA: 0.2 mg/dL (ref 0.0–1.0)
pH: 6 (ref 5.0–8.0)

## 2010-08-10 LAB — DIFFERENTIAL
Basophils Absolute: 0 10*3/uL (ref 0.0–0.1)
Basophils Relative: 0 % (ref 0–1)
Lymphocytes Relative: 30 % (ref 12–46)
Monocytes Absolute: 0.5 10*3/uL (ref 0.1–1.0)
Neutro Abs: 2.7 10*3/uL (ref 1.7–7.7)
Neutrophils Relative %: 57 % (ref 43–77)

## 2010-08-10 LAB — CBC
HCT: 34.4 % — ABNORMAL LOW (ref 39.0–52.0)
Hemoglobin: 11.1 g/dL — ABNORMAL LOW (ref 13.0–17.0)
RDW: 13.4 % (ref 11.5–15.5)
WBC: 4.7 10*3/uL (ref 4.0–10.5)

## 2010-08-10 LAB — CK TOTAL AND CKMB (NOT AT ARMC): Relative Index: 1.6 (ref 0.0–2.5)

## 2010-08-10 LAB — BRAIN NATRIURETIC PEPTIDE: Pro B Natriuretic peptide (BNP): 75 pg/mL (ref 0.0–100.0)

## 2010-08-18 LAB — GLUCOSE, CAPILLARY
Glucose-Capillary: 131 mg/dL — ABNORMAL HIGH (ref 70–99)
Glucose-Capillary: 136 mg/dL — ABNORMAL HIGH (ref 70–99)
Glucose-Capillary: 166 mg/dL — ABNORMAL HIGH (ref 70–99)

## 2010-08-18 LAB — BASIC METABOLIC PANEL
BUN: 11 mg/dL (ref 6–23)
CO2: 29 mEq/L (ref 19–32)
Calcium: 8.8 mg/dL (ref 8.4–10.5)
Calcium: 9 mg/dL (ref 8.4–10.5)
Chloride: 106 mEq/L (ref 96–112)
Creatinine, Ser: 1.21 mg/dL (ref 0.4–1.5)
Creatinine, Ser: 1.23 mg/dL (ref 0.4–1.5)
GFR calc Af Amer: >60 mL/min (ref >60)
GFR calc non Af Amer: 58 mL/min — ABNORMAL LOW (ref >60)
Glucose, Bld: 107 mg/dL — ABNORMAL HIGH (ref 70–99)
Sodium: 137 mEq/L (ref 135–145)

## 2010-08-18 LAB — CBC
HCT: 30.4 % — ABNORMAL LOW (ref 39.0–52.0)
Hemoglobin: 10.2 g/dL — ABNORMAL LOW (ref 13.0–17.0)
Hemoglobin: 10.7 g/dL — ABNORMAL LOW (ref 13.0–17.0)
MCHC: 33.4 g/dL (ref 30.0–36.0)
MCHC: 33.6 g/dL (ref 30.0–36.0)
MCV: 91.6 fL (ref 78.0–100.0)
MCV: 91.9 fL (ref 78.0–100.0)
RBC: 3.51 MIL/uL — ABNORMAL LOW (ref 4.22–5.81)
RDW: 14.3 % (ref 11.5–15.5)
RDW: 14.5 % (ref 11.5–15.5)
WBC: 4.4 10*3/uL (ref 4.0–10.5)

## 2010-08-18 LAB — CK TOTAL AND CKMB (NOT AT ARMC)
CK, MB: 2.9 ng/mL (ref 0.3–4.0)
Relative Index: 1.6 (ref 0.0–2.5)
Total CK: 184 U/L (ref 7–232)

## 2010-08-18 LAB — CARDIAC PANEL(CRET KIN+CKTOT+MB+TROPI)
Relative Index: 2 (ref 0.0–2.5)
Total CK: 158 U/L (ref 7–232)
Total CK: 163 U/L (ref 7–232)
Troponin I: 0.01 ng/mL (ref 0.00–0.06)
Troponin I: 0.01 ng/mL (ref 0.00–0.06)

## 2010-08-18 LAB — COMPREHENSIVE METABOLIC PANEL
Alkaline Phosphatase: 44 U/L (ref 39–117)
BUN: 16 mg/dL (ref 6–23)
Creatinine, Ser: 1.16 mg/dL (ref 0.4–1.5)
Glucose, Bld: 139 mg/dL — ABNORMAL HIGH (ref 70–99)
Potassium: 4.1 mEq/L (ref 3.5–5.1)
Total Bilirubin: 0.6 mg/dL (ref 0.3–1.2)
Total Protein: 6.5 g/dL (ref 6.0–8.3)

## 2010-08-18 LAB — VITAMIN B12: Vitamin B-12: 328 pg/mL (ref 211–911)

## 2010-08-18 LAB — LIPID PANEL: Cholesterol: 140 mg/dL (ref 0–200)

## 2010-08-18 LAB — IRON AND TIBC
Iron: 62 ug/dL (ref 42–135)
Saturation Ratios: 20 % (ref 20–55)
TIBC: 316 ug/dL (ref 215–435)

## 2010-08-18 LAB — RETICULOCYTES
RBC.: 3.52 MIL/uL — ABNORMAL LOW (ref 4.22–5.81)
Retic Ct Pct: 1.5 % (ref 0.4–3.1)

## 2010-08-18 LAB — POCT CARDIAC MARKERS
Myoglobin, poc: 77.3 ng/mL (ref 12–200)
Troponin i, poc: <0.05 ng/mL (ref 0.00–0.09)

## 2010-08-18 LAB — TSH: TSH: 1.197 u[IU]/mL (ref 0.350–4.500)

## 2010-08-18 LAB — TROPONIN I: Troponin I: 0.01 ng/mL (ref 0.00–0.06)

## 2010-08-18 LAB — FERRITIN: Ferritin: 58 ng/mL (ref 22–322)

## 2010-08-21 LAB — URINALYSIS, ROUTINE W REFLEX MICROSCOPIC
Glucose, UA: NEGATIVE mg/dL
Hgb urine dipstick: NEGATIVE
Ketones, ur: NEGATIVE mg/dL
Protein, ur: NEGATIVE mg/dL
pH: 5.5 (ref 5.0–8.0)

## 2010-08-21 LAB — POCT I-STAT, CHEM 8
Creatinine, Ser: 1.1 mg/dL (ref 0.4–1.5)
Hemoglobin: 10.5 g/dL — ABNORMAL LOW (ref 13.0–17.0)
Potassium: 4 mEq/L (ref 3.5–5.1)
Sodium: 139 mEq/L (ref 135–145)
TCO2: 27 mmol/L (ref 0–100)

## 2010-08-21 LAB — GLUCOSE, CAPILLARY: Glucose-Capillary: 107 mg/dL — ABNORMAL HIGH (ref 70–99)

## 2010-08-24 LAB — URINALYSIS, ROUTINE W REFLEX MICROSCOPIC
Hgb urine dipstick: NEGATIVE
Nitrite: NEGATIVE
Protein, ur: NEGATIVE mg/dL
Specific Gravity, Urine: 1.019 (ref 1.005–1.030)
Urobilinogen, UA: 0.2 mg/dL (ref 0.0–1.0)

## 2010-08-24 LAB — DIFFERENTIAL
Basophils Relative: 1 % (ref 0–1)
Eosinophils Absolute: 0 10*3/uL (ref 0.0–0.7)
Eosinophils Relative: 0 % (ref 0–5)
Lymphs Abs: 1.3 10*3/uL (ref 0.7–4.0)
Neutrophils Relative %: 74 % (ref 43–77)

## 2010-08-24 LAB — CBC
HCT: 35.5 % — ABNORMAL LOW (ref 39.0–52.0)
MCHC: 32.2 g/dL (ref 30.0–36.0)
MCV: 92.4 fL (ref 78.0–100.0)
Platelets: 194 10*3/uL (ref 150–400)
RDW: 13.8 % (ref 11.5–15.5)
WBC: 6.9 10*3/uL (ref 4.0–10.5)

## 2010-08-24 LAB — BASIC METABOLIC PANEL
BUN: 16 mg/dL (ref 6–23)
CO2: 28 mEq/L (ref 19–32)
Chloride: 104 mEq/L (ref 96–112)
Creatinine, Ser: 1.24 mg/dL (ref 0.4–1.5)
Glucose, Bld: 141 mg/dL — ABNORMAL HIGH (ref 70–99)
Potassium: 4.4 mEq/L (ref 3.5–5.1)

## 2010-08-24 LAB — URINE CULTURE
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
## 2010-10-16 NOTE — Assessment & Plan Note (Signed)
[facility]                              CARDIOLOGY OFFICE NOTE   FLEET, HIGHAM                     MRN:          [id number]  DATE:03/08/2006                            DOB:          November 06, 1932    This is a 77 year old African-American male patient who was added onto our  schedule for complaints of chest pain and shortness of breath.  The patient  saw Dr. Everardo All yesterday and was felt to have viral gastroenteritis.  The  patient describes waking up with severe headache.  It felt like the room was  moving around him, and then he developed some shortness of breath and  abdominal fullness and tightness that went into his lower chest.  When he  saw Dr. Everardo All, his temperature was 101.  Labs were drawn.  White count was  normal at 7.9, hemoglobin 11.5, hematocrit 33.  LFTs were stable and  hemoglobin A1C was up to 7.4.  The patient has been severely depressed and  wanting to die, and stopped all of his medications, according to his wife.  Dr. Everardo All had him start some of his medications back, but not all of them.  The patient walks daily and does get out of breath, but does not have  regular chest tightness.  He does have a history of coronary artery disease  status post CABG 10 years ago.  He was admitted in January 2007 with  atypical chest pain and had a negative adenosine Cardiolite at that time.  He denies any prolonged chest tightness, dizziness, or pre-syncope.  He does  have dyspnea on exertion.   CURRENT MEDICATIONS:  Started yesterday:  1. Aricept 10 mg daily.  2. Simvastatin 80 mg nightly.  3. Fluoxetine 40 mg daily.  4. Glyburide metformin 5/500 daily.  5. Aspirin 325 daily.  6. He had been on Benicar hydrochlorothiazide for his hypertension, but      this was not resumed.   PHYSICAL EXAM:  This is a 77 year old African-American male in no acute  distress.  Blood pressure 136/72, pulse 75, temperature 97.7, weight 180.  NECK:  Without JVD, HJR, bruit, or thyroid enlargement.  LUNGS:  Clear anterior, posterior, and lateral.  HEART:  Regular rate and rhythm at 75 beats per minute.  Normal S1 and S2.  A 1/6 systolic murmur at the left sternal border.  ABDOMEN:  Slightly distended.  Normoactive bowel sounds.  Soft without  organomegaly, masses, lesions, or abnormal tenderness.  EXTREMITIES:  Without cyanosis, clubbing, or edema.  He has good distal  pulses.   EKG normal sinus rhythm with PACs.  Nonspecific ST changes.  No acute  change.   IMPRESSION:  1. Chest pain and shortness of breath, question cardiac-related.  Will      resume medications and continue to monitor.  2. History of coronary artery disease status post coronary artery bypass      grafting 10 years ago with negative adenosine Cardiolite in January      2007.  3. Noncompliance, had stopped all his medications.  4. Depression.  5. Hypertension.  6. Hyperlipidemia.  7.  Diabetes mellitus.  8. Osteoarthritis.  9. Benign prostatic hypertrophy.  10.Viral gastroenteritis.   PLAN:  At this time I have given him a prescription for Benicar,  hydrochlorothiazide, as well as sublingual nitroglycerin with instructions  of how to take it.  I have told him the importance of staying on his  medications and not stopping them abruptly.  He will see Dr. Jens Som back  in 1 month and call if he has any further trouble in the interim.      ______________________________  Jacolyn Reedy, PA-C    ______________________________  Bevelyn Buckles. Bensimhon, MD    ML/MedQ  DD:  03/08/2006  DT:  03/09/2006  Job #:  [id number]
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
## 2010-10-16 NOTE — Assessment & Plan Note (Signed)
[facility]                            CARDIOLOGY OFFICE NOTE   DURAN, OHERN                     MRN:          [id number]  DATE:05/11/2006                            DOB:          01/07/1933    Mr. Mullendore is a gentleman who is 77 years old and is status post  coronary artery bypassing graft.  I do not have his chart available  today.  Since I last saw him he apparently was seen in the office and  had discontinued all of his medications.  However, he was resumed on  some of his medications at his last visit.  Since that time he does have  some dyspnea on exertion but there is no orthopnea or PND and there is  no pedal edema.  He does not have palpitations.  He occasionally has  chest pain when he is upset but does not have exertional chest pain.   His medications at present include:  1. Aspirin 81 mg p.o. daily.  2. Aricept 10 mg p.o. daily.  3. Janumet 50/1000 mg p.o. daily.  4. Benicar HCT 40/25 daily.   His physical exam today shows a blood pressure 130/76 and his pulse is  76.  He weighs 177 pounds.  His neck is supple with no bruits.  His  chest is clear.  His cardiovascular reveals a regular rate and rhythm.  His extremities show no edema.   DIAGNOSES:  1. History of coronary artery disease status post coronary artery      bypassing graft.  Vague chest pain.  2. History of noncompliance.  3. Depression.  4. Hypertension.  5. Hyperlipidemia.  6. Diabetes mellitus.  7. Benign prostatic hypertrophy.   PLAN:  Mr. Drummer appears to be doing reasonably well from a  symptomatic standpoint.  We will plan to proceed with an adenosine  Myoview for risk stratification.  He will continue on his Benicar and  aspirin and I have asked him to resume his Zocor at 80 mg p.o. q.h.s.  We will check lipids and liver as well as a BMET in 6 weeks.  We will  see him back in approximately 6 months.     Madolyn Frieze Jens Som, MD, [facility]  Electronically Signed    BSC/MedQ  DD: 05/11/2006  DT: 05/11/2006  Job #: [id number]

## 2010-10-16 NOTE — Consult Note (Signed)
NAMEAXXEL, GUDE NO.:  [id number]   MEDICAL RECORD NO.:  [id number]          PATIENT TYPE:  INP   LOCATION:  3704                         FACILITY:  [facility]   PHYSICIAN:  Marrian Salvage. Freida Busman, M.D. [facility] OF BIRTH:  Apr 22, 1933   DATE OF CONSULTATION:  06/27/2005  DATE OF DISCHARGE:                                   CONSULTATION   CARDIOLOGIST:  Olga Millers, M.D. [facility].   PRIMARY MEDICAL DOCTOR:  Sean A. Everardo All, M.D. [facility].   CHIEF COMPLAINT:  Chest tightness.   HISTORY OF PRESENT ILLNESS:  The patient is a 77 year old male with a  history of coronary artery bypass grafting in 1998 in the setting of a non-  ST elevation MI.  His last catheterization was in 2000 showing patent grafts  with LIMA to LAD, SVG to PDA, SVG to OM1, SVG to OM3 and a retrograde  filling of a totally occluded OM4.  His EF was 60%.  He had a last  functional study September 02, 2002, which was an adenosine Cardiolite showing no  evidence of scar, no ischemia and an EF of 57%.  He has a history of  atypical chest pain and negative Cardiolites.   He was in his usual state of health until the last couple of weeks when he  started having intermittent dyspnea on exertion associated with some  anxiety.  He does sound to be active at baseline and does frequent walking.  He ultimately saw his primary medical doctor on June 21, 2005.  The  report of that visit is not currently available; however, the laboratories  from that visit are all basically negative.  The patient said he was  scheduled to see Neurology after that visit and that appointment is pending.   Within the last three to four days, the patient noticed that he was getting  tightness in his chest.  It was mostly occurring after eating or drinking.  Alka-Seltzer and belching seemed to improve it.  There was no radiation, but  it was sometimes associated with shortness of breath.  No diaphoresis.   Then last night, January 27  after eating dinner at a restaurant the patient  noticed a fairly significant chest tightness.  He also complained of food  not tasting good.  The chest tightness basically lasted all night although  he was able to sleep some.  He had mild nausea and then in the night had  emesis x1, which helped his nausea.  However, his chest tightness persisted  all night into the morning.  When he was getting ready to go to church with  his wife this morning, he decided to come to the emergency department  instead.   In the ED, initial evaluation was negative with negative EKG and cardiac  markers.  He was admitted to the [facility] and we were  consulted for consideration of cath versus stress testing.   PAST MEDICAL HISTORY:  1.  Coronary artery disease status post bypass graft by Dr.  Mervin Hack in      1998.  2.  History of atypical  chest pain with negative Cardiolites.  3.  Hypertension.  4.  Hyperlipidemia with lipids on June 21, 2005 showing total cholesterol      224, triglycerides 61, HDL 39, LDL 187.  5.  Diabetes type 2, moderately controlled.  6.  Medication noncompliance.  7.  Arthritis with pain in the hip and shoulders.  8.  Depression over the last two years since his grandson's death.  9.  BPH with nocturia up to seven to eight times per night, scheduled for      surgery in the past, but then patient decided not to do it.   ALLERGIES:  NO KNOWN DRUG ALLERGIES.   HOME MEDICATIONS:  1.  Maxzide 37.5/25 one tab daily.  2.  Metformin 1000 mg q. IM.  3.  Prozac 20 mg daily.  4.  Aspirin one tablet b.i.d.   INPATIENT MEDICATIONS:  1.  Lopressor 12.5 b.i.d.  2.  Protonix 40 mg IV daily.  3.  Maxzide one tablet daily.  4.  Prozac 20 mg daily.  5.  Aspirin 81 mg daily.  6.  Lovenox 40 mg subcu daily.  7.  Restoril 15 mg nightly for sleep.   SOCIAL HISTORY:  The patient lives in [place] with his wife.  He is  retired from a [employer].  He has five  children, all of whom  live locally.  He has never smoked cigarettes, but he does smoke one cigar  per day.  He drinks red wine occasionally and formerly drank some whiskey  but has no significant alcohol history.  He denies any drug use or stimulant  use.  He remains relatively active and does regular walking.  He does have  health insurance.  He is illiterate per report.   FAMILY HISTORY:  The patient's mother died in her 55s of unknown etiology.  The patient's father died at age 77, also of unknown cause.  He has a  brother, who had a heart attack and stroke.   REVIEW OF SYSTEMS:  The patient reports chest tightness, shortness of  breath, dyspnea on exertion, nausea, vomiting, GERD symptoms, nocturia.  Otherwise, a 14-point review of systems was negative in detail.   Advanced directives.  The patient has no living will.  He is a full code.   PHYSICAL EXAMINATION:  VITAL SIGNS:  Temperature 98.6, pulse 55,  respirations 18, blood pressure 131/61.  GENERAL:  The patient was in no apparent distress, able to ambulate around  the room, conversant and pleasant.  NECK:  His JVP was flat.  No  thyromegaly.  No carotid bruits.  LUNGS:  Clear to auscultation bilaterally.  HEART:  Regular rate and rhythm with normal S1 and S2, some occasional  ectopy, no murmurs or gallops.  His chest wall is nontender.  ABDOMEN:  Soft and nontender with normal bowel sounds.  No hepatomegaly.  EXTREMITIES:  No significant edema, and were warm and well perfused.  NEUROLOGICALLY:  He was grossly intact and oriented.   Chest x-ray was negative per report.   EKG showed normal sinus rhythm at 66 with atrial trigeminy.  Intervals were  normal, no Q-waves, a repolarization abnormality in V2-V3 consistent with  his African-American status, no hypertrophy.  Compared to an EKG in April of  2004, no change.  Telemetry here showed frequent PACs, as well as occasional  PVCs.  LABORATORY:  White blood cell count of 5,  hematocrit 38, platelets 220,  sodium 135, potassium 3.9, creatinine 1.1, glucose 159,  LFT is normal, MB  3.7, troponin negative, TSH 136, PSA 3.55, urinalysis negative.   IMPRESSION:  A 77 year old man with a history of coronary artery bypass  graft and multiple cardiac risk factors, who presents with chest tightness.   Problem #1.  Chest pain symptoms.  He has multiple risk factors, but his  troponin is negative 18 hours after symptom onset.  EKG is normal.  Likely,  this represents GERD.  He is being placed on telemetry and getting rule out  myocardial infarction.  I have canceled his echocardiogram and scheduled him  for an exercise Cardiolite.  Given his history of chest pain with negative  studies in the past, as well as the somewhat atypical nature of his  presentation, I do not feel that a catheterization is indicated at this  time.  He also needs improved secondary prevention for CAD, see below.  The  patient is getting a D-dimer per the Internal Medicine Team.  He has been  placed on IV proton pump inhibitor and should likely be sent home on GI  treatment.  1.  Diabetes.  Hold metformin in case his stress test is positive.  Sliding      scale insulin and Accu-Cheks.  He is currently managed on Maxzide for      his hypertension.  I would recommend either switching that to an ACE      inhibitor or at least checking for a proteinuria given his history of      diabetes.  2.  Hyperlipidemia.  LDL is 187.  Given his history of CABG, he needs to be      on statin therapy.  He does say he has insurance.  Therefore, Crestor,      Zocor or Lipitor would be most appropriate.  3.  Tobacco abuse with cigars.  Cessation counseling was done.   DISPOSITION:  Per the regular team.  We will follow along.           ______________________________  Marrian Salvage. Freida Busman, M.D. [facility]     LAA/MEDQ  D:  06/27/2005  T:  06/28/2005  Job:  [id number]

## 2010-10-16 NOTE — Letter (Signed)
September 25, 2006     RE:  GEORGIO, HATTABAUGH  MRN:  [id number]  /  DOB:  Jan 16, 1933   Dictation Cancelled.    Sincerely,      Sean A. Everardo All, MD    SAE/MedQ  DD: 09/25/2006  DT: 09/25/2006  Job #: [id number]

## 2010-10-17 IMAGING — CR DG CHEST 1V PORT
1 series · 1 of 1 positions shown · non-contrast
Comparison: Portable exam 6853 hours compared to 08/11/2009

CLINICAL DATA: Chest pain, shortness of breath

PORTABLE CHEST - 1 VIEW

[view not recorded]
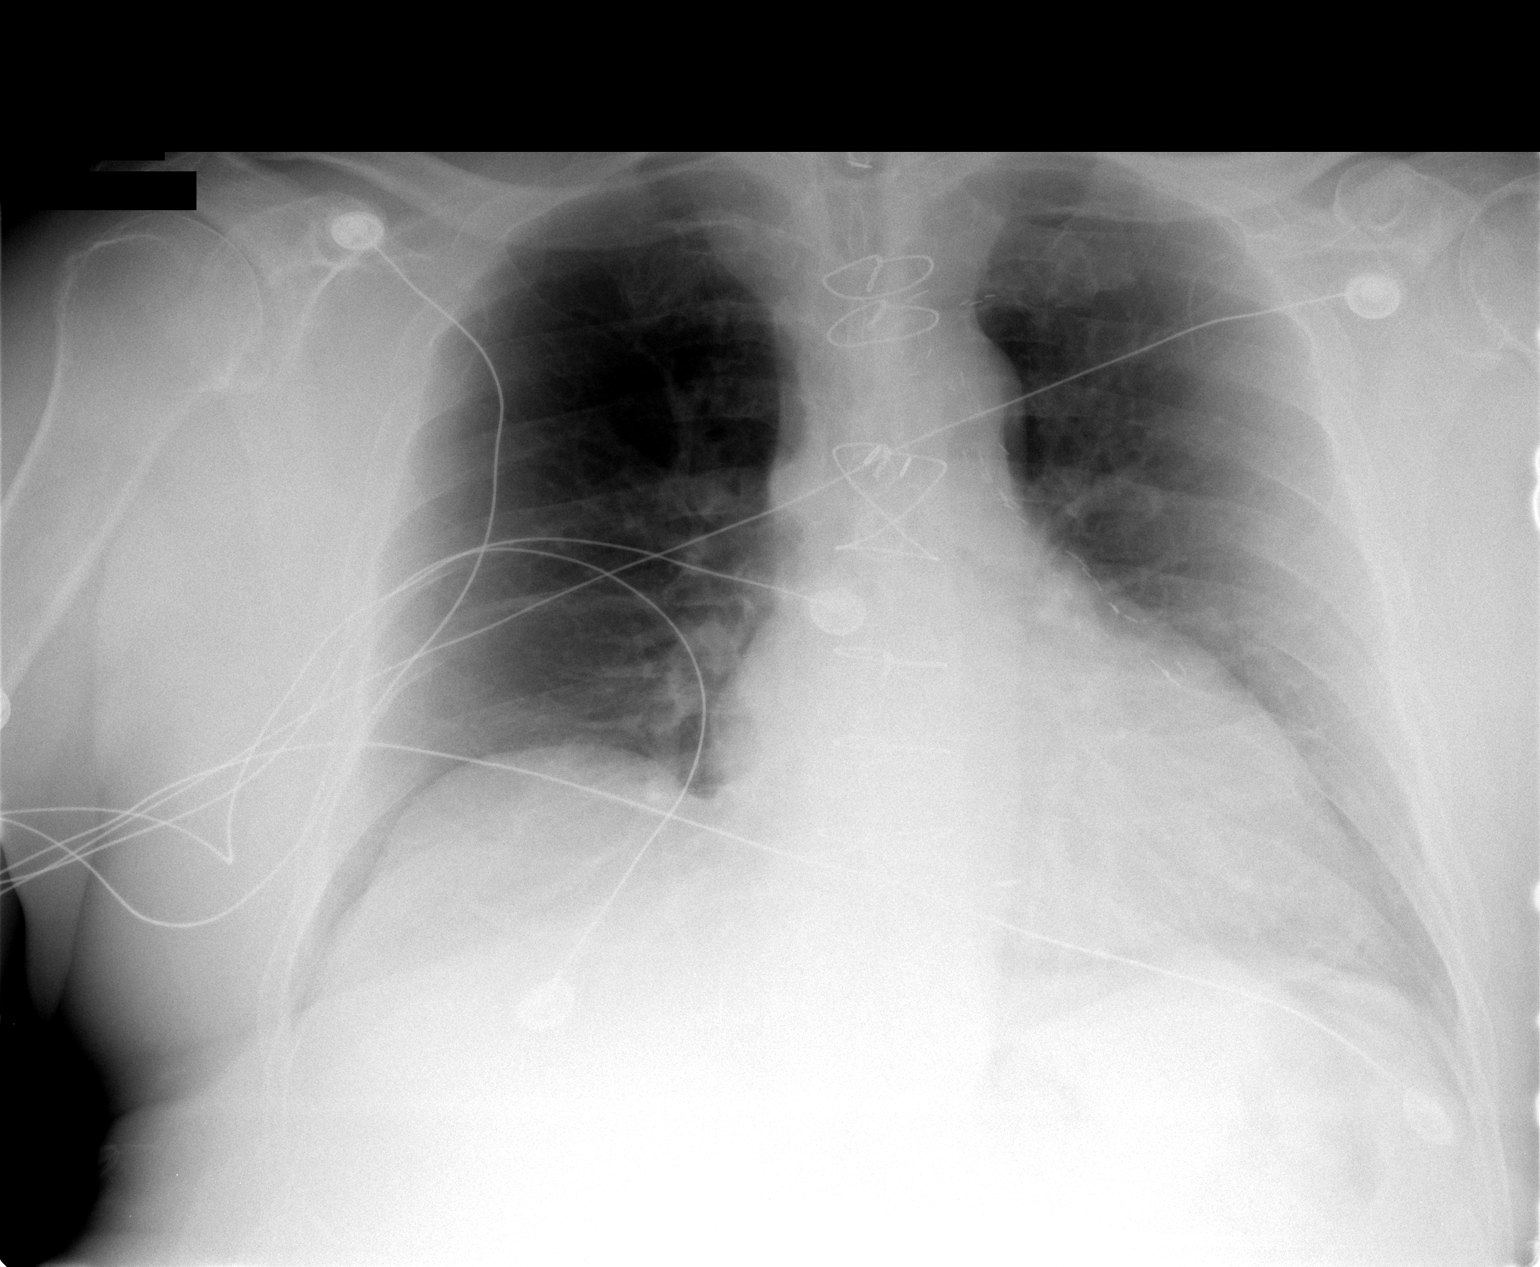

[1 of 1 positions shown; findings below may reference images not displayed]

FINDINGS: Cardiac enlargement status post CABG.
Normal mediastinal contours and pulmonary vascularity.
Minimal right base atelectasis and chronic eventration of right
diaphragm.
No acute failure or consolidation.
Cardiac monitoring lines project over chest.
No pneumothorax.
IMPRESSION: Cardiomegaly status post CABG.
Minimal chronic right basilar atelectasis.

## 2011-03-02 LAB — PROTIME-INR
INR: 1.2 (ref 0.00–1.49)
INR: 1.3 (ref 0.00–1.49)
Prothrombin Time: 15.4 seconds — ABNORMAL HIGH (ref 11.6–15.2)
Prothrombin Time: 16.4 seconds — ABNORMAL HIGH (ref 11.6–15.2)

## 2011-03-02 LAB — GLUCOSE, CAPILLARY
Glucose-Capillary: 161 mg/dL — ABNORMAL HIGH (ref 70–99)
Glucose-Capillary: 185 mg/dL — ABNORMAL HIGH (ref 70–99)
Glucose-Capillary: 213 mg/dL — ABNORMAL HIGH (ref 70–99)

## 2011-03-02 LAB — TYPE AND SCREEN
ABO/RH(D): AB POS
Antibody Screen: NEGATIVE

## 2011-03-02 LAB — HEMOGLOBIN AND HEMATOCRIT, BLOOD
HCT: 31.5 % — ABNORMAL LOW (ref 39.0–52.0)
Hemoglobin: 10.6 g/dL — ABNORMAL LOW (ref 13.0–17.0)

## 2011-03-02 LAB — CBC
HCT: 38.3 % — ABNORMAL LOW (ref 39.0–52.0)
Platelets: 211 10*3/uL (ref 150–400)
RDW: 13.8 % (ref 11.5–15.5)
WBC: 7.3 10*3/uL (ref 4.0–10.5)

## 2011-03-02 LAB — ABO/RH: ABO/RH(D): AB POS

## 2011-03-02 LAB — DIFFERENTIAL
Eosinophils Relative: 1 % (ref 0–5)
Lymphocytes Relative: 15 % (ref 12–46)
Monocytes Absolute: 0.5 10*3/uL (ref 0.1–1.0)
Monocytes Relative: 6 % (ref 3–12)
Neutro Abs: 5.7 10*3/uL (ref 1.7–7.7)

## 2011-03-02 LAB — COMPREHENSIVE METABOLIC PANEL
AST: 24 U/L (ref 0–37)
Albumin: 3.9 g/dL (ref 3.5–5.2)
Alkaline Phosphatase: 62 U/L (ref 39–117)
BUN: 21 mg/dL (ref 6–23)
Creatinine, Ser: 1.62 mg/dL — ABNORMAL HIGH (ref 0.4–1.5)
GFR calc Af Amer: 51 mL/min — ABNORMAL LOW (ref >60)
Potassium: 4.2 mEq/L (ref 3.5–5.1)
Total Protein: 7.2 g/dL (ref 6.0–8.3)

## 2011-03-02 LAB — BASIC METABOLIC PANEL
Calcium: 8.8 mg/dL (ref 8.4–10.5)
GFR calc Af Amer: >60 mL/min (ref >60)
GFR calc non Af Amer: >60 mL/min (ref >60)
Potassium: 4.4 mEq/L (ref 3.5–5.1)
Sodium: 136 mEq/L (ref 135–145)

## 2011-03-02 LAB — ETHANOL: Alcohol, Ethyl (B): <5 mg/dL (ref 0–10)

## 2011-03-02 LAB — PREPARE RBC (CROSSMATCH)

## 2011-03-02 LAB — APTT: aPTT: 25 seconds (ref 24–37)

## 2011-03-02 LAB — POCT CARDIAC MARKERS

## 2011-03-05 LAB — GLUCOSE, CAPILLARY
Glucose-Capillary: 119 mg/dL — ABNORMAL HIGH (ref 70–99)
Glucose-Capillary: 122 mg/dL — ABNORMAL HIGH (ref 70–99)
Glucose-Capillary: 128 mg/dL — ABNORMAL HIGH (ref 70–99)
Glucose-Capillary: 137 mg/dL — ABNORMAL HIGH (ref 70–99)
Glucose-Capillary: 138 mg/dL — ABNORMAL HIGH (ref 70–99)
Glucose-Capillary: 138 mg/dL — ABNORMAL HIGH (ref 70–99)
Glucose-Capillary: 146 mg/dL — ABNORMAL HIGH (ref 70–99)
Glucose-Capillary: 147 mg/dL — ABNORMAL HIGH (ref 70–99)
Glucose-Capillary: 152 mg/dL — ABNORMAL HIGH (ref 70–99)
Glucose-Capillary: 155 mg/dL — ABNORMAL HIGH (ref 70–99)
Glucose-Capillary: 159 mg/dL — ABNORMAL HIGH (ref 70–99)
Glucose-Capillary: 162 mg/dL — ABNORMAL HIGH (ref 70–99)
Glucose-Capillary: 164 mg/dL — ABNORMAL HIGH (ref 70–99)
Glucose-Capillary: 175 mg/dL — ABNORMAL HIGH (ref 70–99)
Glucose-Capillary: 178 mg/dL — ABNORMAL HIGH (ref 70–99)
Glucose-Capillary: 181 mg/dL — ABNORMAL HIGH (ref 70–99)
Glucose-Capillary: 208 mg/dL — ABNORMAL HIGH (ref 70–99)
Glucose-Capillary: 227 mg/dL — ABNORMAL HIGH (ref 70–99)

## 2011-03-05 LAB — PROTIME-INR
INR: 1.1 (ref 0.00–1.49)
INR: 1.1 (ref 0.00–1.49)
INR: 1.2 (ref 0.00–1.49)
INR: 1.6 — ABNORMAL HIGH (ref 0.00–1.49)
INR: 2.1 — ABNORMAL HIGH (ref 0.00–1.49)
Prothrombin Time: 15.6 seconds — ABNORMAL HIGH (ref 11.6–15.2)
Prothrombin Time: 24.6 seconds — ABNORMAL HIGH (ref 11.6–15.2)

## 2014-01-28 ENCOUNTER — Inpatient Hospital Stay (HOSPITAL_COMMUNITY)
Admission: EM | Admit: 2014-01-28 | Discharge: 2014-01-31 | DRG: 065 | Disposition: A | Discharge status: Skilled Nursing Facility | Payer: Medicare PPO | Attending: Internal Medicine | Admitting: Internal Medicine

## 2014-01-28 ENCOUNTER — Emergency Department (HOSPITAL_COMMUNITY): Payer: Medicare PPO

## 2014-01-28 ENCOUNTER — Encounter (HOSPITAL_COMMUNITY): Payer: Self-pay | Admitting: Emergency Medicine

## 2014-01-28 ENCOUNTER — Inpatient Hospital Stay (HOSPITAL_COMMUNITY): Payer: Medicare PPO

## 2014-01-28 DIAGNOSIS — R634 Abnormal weight loss: Secondary | ICD-10-CM | POA: Diagnosis present

## 2014-01-28 DIAGNOSIS — F0280 Dementia in other diseases classified elsewhere without behavioral disturbance: Secondary | ICD-10-CM | POA: Diagnosis present

## 2014-01-28 DIAGNOSIS — R531 Weakness: Secondary | ICD-10-CM | POA: Diagnosis present

## 2014-01-28 DIAGNOSIS — IMO0001 Reserved for inherently not codable concepts without codable children: Secondary | ICD-10-CM | POA: Diagnosis present

## 2014-01-28 DIAGNOSIS — E441 Mild protein-calorie malnutrition: Secondary | ICD-10-CM | POA: Diagnosis present

## 2014-01-28 DIAGNOSIS — M129 Arthropathy, unspecified: Secondary | ICD-10-CM | POA: Diagnosis present

## 2014-01-28 DIAGNOSIS — Z9181 History of falling: Secondary | ICD-10-CM | POA: Diagnosis not present

## 2014-01-28 DIAGNOSIS — F3289 Other specified depressive episodes: Secondary | ICD-10-CM | POA: Diagnosis present

## 2014-01-28 DIAGNOSIS — R1310 Dysphagia, unspecified: Secondary | ICD-10-CM | POA: Diagnosis present

## 2014-01-28 DIAGNOSIS — K219 Gastro-esophageal reflux disease without esophagitis: Secondary | ICD-10-CM | POA: Diagnosis present

## 2014-01-28 DIAGNOSIS — I10 Essential (primary) hypertension: Secondary | ICD-10-CM

## 2014-01-28 DIAGNOSIS — E871 Hypo-osmolality and hyponatremia: Secondary | ICD-10-CM | POA: Diagnosis present

## 2014-01-28 DIAGNOSIS — Z9119 Patient's noncompliance with other medical treatment and regimen: Secondary | ICD-10-CM

## 2014-01-28 DIAGNOSIS — R627 Adult failure to thrive: Secondary | ICD-10-CM | POA: Diagnosis present

## 2014-01-28 DIAGNOSIS — E785 Hyperlipidemia, unspecified: Secondary | ICD-10-CM | POA: Diagnosis present

## 2014-01-28 DIAGNOSIS — Z8673 Personal history of transient ischemic attack (TIA), and cerebral infarction without residual deficits: Secondary | ICD-10-CM | POA: Diagnosis not present

## 2014-01-28 DIAGNOSIS — I6350 Cerebral infarction due to unspecified occlusion or stenosis of unspecified cerebral artery: Secondary | ICD-10-CM | POA: Diagnosis present

## 2014-01-28 DIAGNOSIS — I824Y9 Acute embolism and thrombosis of unspecified deep veins of unspecified proximal lower extremity: Secondary | ICD-10-CM | POA: Diagnosis present

## 2014-01-28 DIAGNOSIS — I824Z9 Acute embolism and thrombosis of unspecified deep veins of unspecified distal lower extremity: Secondary | ICD-10-CM

## 2014-01-28 DIAGNOSIS — Z993 Dependence on wheelchair: Secondary | ICD-10-CM | POA: Diagnosis not present

## 2014-01-28 DIAGNOSIS — F329 Major depressive disorder, single episode, unspecified: Secondary | ICD-10-CM | POA: Diagnosis present

## 2014-01-28 DIAGNOSIS — E119 Type 2 diabetes mellitus without complications: Secondary | ICD-10-CM

## 2014-01-28 DIAGNOSIS — R748 Abnormal levels of other serum enzymes: Secondary | ICD-10-CM | POA: Diagnosis present

## 2014-01-28 DIAGNOSIS — E1165 Type 2 diabetes mellitus with hyperglycemia: Secondary | ICD-10-CM

## 2014-01-28 DIAGNOSIS — Z7401 Bed confinement status: Secondary | ICD-10-CM

## 2014-01-28 DIAGNOSIS — R0602 Shortness of breath: Secondary | ICD-10-CM

## 2014-01-28 DIAGNOSIS — Z951 Presence of aortocoronary bypass graft: Secondary | ICD-10-CM

## 2014-01-28 DIAGNOSIS — E860 Dehydration: Secondary | ICD-10-CM | POA: Diagnosis not present

## 2014-01-28 DIAGNOSIS — R29898 Other symptoms and signs involving the musculoskeletal system: Secondary | ICD-10-CM | POA: Diagnosis present

## 2014-01-28 DIAGNOSIS — R739 Hyperglycemia, unspecified: Secondary | ICD-10-CM

## 2014-01-28 DIAGNOSIS — R079 Chest pain, unspecified: Secondary | ICD-10-CM

## 2014-01-28 DIAGNOSIS — R471 Dysarthria and anarthria: Secondary | ICD-10-CM | POA: Diagnosis present

## 2014-01-28 DIAGNOSIS — I2510 Atherosclerotic heart disease of native coronary artery without angina pectoris: Secondary | ICD-10-CM | POA: Diagnosis present

## 2014-01-28 DIAGNOSIS — I498 Other specified cardiac arrhythmias: Secondary | ICD-10-CM | POA: Diagnosis not present

## 2014-01-28 DIAGNOSIS — I739 Peripheral vascular disease, unspecified: Secondary | ICD-10-CM | POA: Diagnosis present

## 2014-01-28 DIAGNOSIS — Z91199 Patient's noncompliance with other medical treatment and regimen due to unspecified reason: Secondary | ICD-10-CM

## 2014-01-28 DIAGNOSIS — I639 Cerebral infarction, unspecified: Secondary | ICD-10-CM | POA: Diagnosis present

## 2014-01-28 DIAGNOSIS — Z87891 Personal history of nicotine dependence: Secondary | ICD-10-CM | POA: Diagnosis not present

## 2014-01-28 DIAGNOSIS — G309 Alzheimer's disease, unspecified: Secondary | ICD-10-CM | POA: Diagnosis present

## 2014-01-28 HISTORY — DX: Acute embolism and thrombosis of unspecified deep veins of unspecified distal lower extremity: I82.4Z9

## 2014-01-28 LAB — GLUCOSE, CAPILLARY
Glucose-Capillary: 335 mg/dL — ABNORMAL HIGH (ref 70–99)
Glucose-Capillary: 250 mg/dL — ABNORMAL HIGH (ref 70–99)

## 2014-01-28 LAB — BASIC METABOLIC PANEL
ANION GAP: 17 — AB (ref 5–15)
BUN: 44 mg/dL — AB (ref 6–23)
CALCIUM: 9 mg/dL (ref 8.4–10.5)
CHLORIDE: 89 meq/L — AB (ref 96–112)
CO2: 21 meq/L (ref 19–32)
CREATININE: 1.46 mg/dL — AB (ref 0.50–1.35)
GFR calc Af Amer: 51 mL/min — ABNORMAL LOW (ref >90)
GFR calc non Af Amer: 44 mL/min — ABNORMAL LOW (ref >90)
GLUCOSE: 309 mg/dL — AB (ref 70–99)
Potassium: 4.5 mEq/L (ref 3.7–5.3)
Sodium: 127 mEq/L — ABNORMAL LOW (ref 137–147)

## 2014-01-28 LAB — COMPREHENSIVE METABOLIC PANEL
ALT: 23 U/L (ref 0–53)
ANION GAP: 18 — AB (ref 5–15)
AST: 24 U/L (ref 0–37)
Albumin: 3.3 g/dL — ABNORMAL LOW (ref 3.5–5.2)
Alkaline Phosphatase: 68 U/L (ref 39–117)
BUN: 48 mg/dL — ABNORMAL HIGH (ref 6–23)
CALCIUM: 9.8 mg/dL (ref 8.4–10.5)
CO2: 20 meq/L (ref 19–32)
CREATININE: 1.54 mg/dL — AB (ref 0.50–1.35)
Chloride: 89 mEq/L — ABNORMAL LOW (ref 96–112)
GFR, EST AFRICAN AMERICAN: 48 mL/min — AB (ref >90)
GFR, EST NON AFRICAN AMERICAN: 41 mL/min — AB (ref >90)
GLUCOSE: 421 mg/dL — AB (ref 70–99)
Potassium: 5.3 mEq/L (ref 3.7–5.3)
SODIUM: 127 meq/L — AB (ref 137–147)
TOTAL PROTEIN: 8.3 g/dL (ref 6.0–8.3)
Total Bilirubin: 0.5 mg/dL (ref 0.3–1.2)

## 2014-01-28 LAB — CBC WITH DIFFERENTIAL/PLATELET
BASOS ABS: 0 10*3/uL (ref 0.0–0.1)
BASOS PCT: 0 % (ref 0–1)
EOS PCT: 0 % (ref 0–5)
Eosinophils Absolute: 0 10*3/uL (ref 0.0–0.7)
HCT: 40.3 % (ref 39.0–52.0)
Hemoglobin: 14.2 g/dL (ref 13.0–17.0)
LYMPHS ABS: 1.3 10*3/uL (ref 0.7–4.0)
Lymphocytes Relative: 18 % (ref 12–46)
MCH: 29.5 pg (ref 26.0–34.0)
MCHC: 35.2 g/dL (ref 30.0–36.0)
MCV: 83.6 fL (ref 78.0–100.0)
Monocytes Absolute: 0.6 10*3/uL (ref 0.1–1.0)
Monocytes Relative: 8 % (ref 3–12)
NEUTROS PCT: 74 % (ref 43–77)
Neutro Abs: 5.4 10*3/uL (ref 1.7–7.7)
Platelets: 157 10*3/uL (ref 150–400)
RBC: 4.82 MIL/uL (ref 4.22–5.81)
RDW: 12.5 % (ref 11.5–15.5)
WBC: 7.4 10*3/uL (ref 4.0–10.5)

## 2014-01-28 LAB — I-STAT TROPONIN, ED: Troponin i, poc: 0.03 ng/mL (ref 0.00–0.08)

## 2014-01-28 LAB — PRO B NATRIURETIC PEPTIDE: PRO B NATRI PEPTIDE: 438.6 pg/mL (ref 0–450)

## 2014-01-28 LAB — LIPASE, BLOOD: LIPASE: 164 U/L — AB (ref 11–59)

## 2014-01-28 MED ORDER — SODIUM CHLORIDE 0.9 % IV SOLN: INTRAVENOUS | Status: DC

## 2014-01-28 MED ORDER — PANTOPRAZOLE SODIUM 40 MG IV SOLR
40.0000 mg | INTRAVENOUS | Status: DC
  Administered 2014-01-28: 40 mg via INTRAVENOUS
  Filled 2014-01-28 (×2): qty 40

## 2014-01-28 MED ORDER — SODIUM CHLORIDE 0.9 % IV SOLN
INTRAVENOUS | Status: DC
  Administered 2014-01-28: 75 mL/h via INTRAVENOUS

## 2014-01-28 MED ORDER — SODIUM CHLORIDE 0.9 % IV BOLUS (SEPSIS)
250.0000 mL | Freq: Once | INTRAVENOUS | Status: AC
  Administered 2014-01-28: 250 mL via INTRAVENOUS

## 2014-01-28 MED ORDER — SODIUM CHLORIDE 0.9 % IV SOLN
INTRAVENOUS | Status: DC
  Administered 2014-01-28: 20:00:00 via INTRAVENOUS

## 2014-01-28 MED ORDER — INSULIN ASPART 100 UNIT/ML ~~LOC~~ SOLN
0.0000 [IU] | Freq: Three times a day (TID) | SUBCUTANEOUS | Status: DC
  Administered 2014-01-28: 7 [IU] via SUBCUTANEOUS

## 2014-01-28 MED ORDER — ENOXAPARIN SODIUM 40 MG/0.4ML ~~LOC~~ SOLN
40.0000 mg | SUBCUTANEOUS | Status: DC
  Administered 2014-01-28 – 2014-01-29 (×2): 40 mg via SUBCUTANEOUS
  Filled 2014-01-28 (×3): qty 0.4

## 2014-01-28 MED ORDER — INSULIN ASPART 100 UNIT/ML ~~LOC~~ SOLN
10.0000 [IU] | Freq: Once | SUBCUTANEOUS | Status: AC
  Administered 2014-01-28: 10 [IU] via INTRAVENOUS

## 2014-01-28 NOTE — ED Notes (Addendum)
Family requesting information about last will, home care, and home equipment. Spoke with ED case management, pt being admitted -- will request social work and case management consult on admission.

## 2014-01-28 NOTE — ED Notes (Signed)
Patient's daughter states that patient has not been eating or drinking for 2 days.   Patient daughter states he has been sleeping a lot.  Patient is at baseline to shake head and give very brief answers.   Patient states he has "allover pain".  Per daughter, patient has had "irregular breathing", nothing noted at triage.

## 2014-01-28 NOTE — H&P (Signed)
Date: 01/28/2014               Patient Name:  Colin [name] MRN: [id number]  DOB: 1933-04-05 Age / Sex: 80 y.o., male   PCP: Romero Belling, MD              Medical Service: Internal Medicine Teaching Service              Attending Physician: Dr. Aletta Edouard, MD    First Contact: Alford Highland, MS 3 Pager: [phone]  Second Contact: Dr. Jill Alexanders Pager: [phone]  Third Contact Dr. Desma Maxim Pager: [phone]       After Hours (After 5p/  First Contact Pager: [phone]  weekends / holidays): Second Contact Pager: [phone]   Chief Complaint: "I think my dad is dehydrated - something is wrong with him."  History of Present Illness: Mr. Benish is a 80 YO man with a PMH of DM2, CAD with CABGx4 in 1997, HTN, stroke, and arthritis who was brought to the ED today by his family because they were concerned about dehydration and the patient's declining health over the past 2 months. The patient has had nothing to eat or drink over the past 2 days and that he has become increasingly sleepy, had an irregular breathing pattern, and that he complained of "all over" pain over the past 2 days. They state that they took a road trip with the patient about 2 months ago and since then, his cognitive and physical abilities have greatly declined. The patient has refused to take any of his medications for the past month. The patient is only able to speak only 1-2 words at a time and that requires much effort and persuasion. Mr. Arrona has been falling more frequently at home when he tries to get out of bed on his own. He uses a scooter at home to get around and his son-in-law frequently has to pick him up to move anywhere. Over the past 1-2 months, the patient has experienced an unexplained 10 pound weight loss. Patient wears diapers and his family denies seeing any blood in his stool. They are unsure of his last colonoscopy. His family reports that Mr. Bowns's mood has significantly worsened over  the past 2 months as well. He used to be cheerful and talkative, but now he becomes tearful and rarely speaks. He has been diagnosed with depression in the past, but has not been taking his medications. Patient endorses headache and occasional cough. He denies any abdominal pain, nausea, vomiting, diarrhea, dysuria, hematuria, hematochezia, or leg edema.  All information was provided by the patient's daughter and son-in-law who care for Mr. Debord.  Meds: Current Facility-Administered Medications  Medication Dose Route Frequency Provider Last Rate Last Dose  . 0.9 %  sodium chloride infusion   Intravenous STAT Vanetta Mulders, MD      . 0.9 %  sodium chloride infusion   Intravenous Continuous Annett Gula, MD 100 mL/hr at 01/28/14 1630    . enoxaparin (LOVENOX) injection 40 mg  40 mg Subcutaneous Q24H Annett Gula, MD      . insulin aspart (novoLOG) injection 0-9 Units  0-9 Units Subcutaneous TID WC Annett Gula, MD      . pantoprazole (PROTONIX) injection 40 mg  40 mg Intravenous Q24H Annett Gula, MD        Allergies: Allergies as of 01/28/2014 - Review Complete 01/28/2014  Allergen Reaction Noted  . Ramipril     Past Medical History  Diagnosis Date  . Diabetes mellitus without complication   . Hypertension   . Stroke   . Arthritis    Past Surgical History  Procedure Laterality Date  . Cardiac surgery     No family history on file. History   Social History  . Marital Status: Married    Spouse Name: N/A    Number of Children: N/A  . Years of Education: N/A   Occupational History  . Not on file.   Social History Main Topics  . Smoking status: Former Games developer  . Smokeless tobacco: Not on file  . Alcohol Use: No  . Drug Use: No  . Sexual Activity: Not on file   Other Topics Concern  . Not on file   Social History Narrative  . No narrative on file   Review of Systems: Constitutional: positive for anorexia, fatigue, malaise and weight loss, negative for  chills and malaise Ears, nose, mouth, throat, and face: positive for hoarseness and voice change, negative for earaches and hearing loss Respiratory: positive for dyspnea on exertion, cough, negative for pleurisy/chest pain, sputum and wheezing Cardiovascular: negative for chest pain and syncope Gastrointestinal: positive for decreased appetite, negative for abdominal pain, change in bowel habits, constipation, diarrhea, nausea and vomiting Genitourinary:negative for dysuria, urinary frequency Musculoskeletal:positive for arthralgias, back pain, muscle weakness and myalgias, negative for bone pain Neurological: positive for coordination problems, gait problems, headaches, speech problems and weakness, negative for paresthesia, seizures and vertigo Behavioral/Psych: positive for depression, fatigue and loss of interest in favorite activities,  negative for aggressive behavior and behavior problems  Physical Exam: Blood pressure 141/80, pulse 88, temperature 97.7 F (36.5 C), temperature source Oral, resp. rate 18, height  (1.651 m), weight 90.719 kg (200 lb), SpO2 100.00%. General appearance: delirious, mild distress and slowed mentation Head: Normocephalic, without obvious abnormality, atraumatic Eyes: conjunctiva/cornea clear, PEERL Throat: lips, mucosa, and tongue normal; teeth and gums normal Lungs: Reduced breath sounds due to lack of effort, CTAB Heart: regular rate and rhythm, S1, S2 normal, no murmur, click, rub or gallop Abdomen: soft, non-tender; bowel sounds normal; no masses,  no organomegaly Extremities: extremities normal, atraumatic, no cyanosis or edema and Candida infection present in R inguinal crease, extending to back Skin: Scab wound on R shin from fall Neurologic: Mental status: alertness: lethargic, affect: tearful at times, poor attention  Motor: grade 3 upper extremities grade 3 Left lower extremity, grade 2 right lower extremity  Lab results: Basic Metabolic  Panel:  Recent Labs  01/28/14 1130  NA 127*  K 5.3  CL 89*  CO2 20  GLUCOSE 421*  BUN 48*  CREATININE 1.54*  CALCIUM 9.8   Liver Function Tests:  Recent Labs  01/28/14 1130  AST 24  ALT 23  ALKPHOS 68  BILITOT 0.5  PROT 8.3  ALBUMIN 3.3*    Recent Labs  01/28/14 1130  LIPASE 164*   CBC:  Recent Labs  01/28/14 1130  WBC 7.4  NEUTROABS 5.4  HGB 14.2  HCT 40.3  MCV 83.6  PLT 157   BNP:  Recent Labs  01/28/14 1130  PROBNP 438.6   CBG:  Recent Labs  01/28/14 1658  GLUCAP 335*   Urinalysis: No results found for this basename: COLORURINE, APPERANCEUR, LABSPEC, PHURINE, GLUCOSEU, HGBUR, BILIRUBINUR, KETONESUR, PROTEINUR, UROBILINOGEN, NITRITE, LEUKOCYTESUR,  in the last 72 hours  Imaging results:  Dg Chest 2 View  01/28/2014   CLINICAL DATA:  Shortness of breath and weakness with history of diabetes and previous  tobacco use. PA and lateral chest x-ray of May 27, 2010.  EXAM: CHEST  2 VIEW  COMPARISON:  PA and lateral chest of May 27, 2010  FINDINGS: The lungs are adequately inflated and clear. The heart and pulmonary vascularity are normal. The patient has undergone previous CABG. There are 6 intact sternal wires present. There is no pleural effusion. The bony thorax is unremarkable.  IMPRESSION: There is no CHF nor other acute cardiopulmonary abnormality.   Electronically Signed   By: David  [place]   On: 01/28/2014 11:05   Ct Head Wo Contrast  01/28/2014   CLINICAL DATA:  Decreased oral intake, numerous falls, history hypertension, diabetes, stroke  EXAM: CT HEAD WITHOUT CONTRAST  TECHNIQUE: Contiguous axial images were obtained from the base of the skull through the vertex without intravenous contrast.  COMPARISON:  07/25/2009  FINDINGS: Generalized atrophy.  Normal ventricular morphology.  No midline shift or mass effect.  Small vessel chronic ischemic changes of deep cerebral white matter.  Old LEFT basal ganglia and RIGHT thalamic lacunar  infarcts.  Small cortical infarct high LEFT parietal region appear subacute to old in age though new since prior CT.  No intracranial hemorrhage, mass lesion, or additional acute infarction.  Benign-appearing BILATERAL basal ganglia calcifications.  No extra-axial fluid collections.  Visualized paranasal sinuses and mastoid air cells clear.  Bones unremarkable.  IMPRESSION: Small subacute to old high LEFT parietal cortical infarct new since 2011.  Atrophy with small vessel chronic ischemic changes of deep cerebral white matter.  Old lacunar infarcts LEFT basal ganglia and RIGHT thalamus.   Electronically Signed   By: Ulyses Southward M.D.   On: 01/28/2014 10:58   Other results: EKG: sinus tachycardia.  Assessment & Plan by Problem: Active Problems:   Dehydration  Dehydration/hyponatremia: Patient's family reports that he has been unwilling to eat or drink for two 2 days. Based on his age, weight, and Na+ level, the patient's free water deficit is calculated to be 4.2 L. His serum osmolality was calculated at 295. Serum Na+ was 127. Serum glucose was 421. Patient's hyponatremia likely to be caused by his fluid deficit.  -NS at 100 mL/hr  -BMEt q6h  -Raise Na+ levels slowly to avoid central pontine demyelination -Cardiac monitoring -Daily weights -NPO -Routine I/O's  Hyperglycemia/DM2: Patient takes Metformin 500 mg po daily at home. Family states that he hasn't been taking medications regularly. CBG on admission was 421. AG elevated at 18.  -Currently NPO -Sliding scale insulin  -Aspart 10 units -HbA1c ordered  Failure to thrive/dysarthria/dysphagia: Patient's family reports an acute decline in the patient's physical and cognitive abilities over the past 2 months. He is more depressed, falling more around the house, refusing to take his medications, and has recently stopped eating or drinking. He has significantly decreased strength in his lower extremities and some decreased strength in his  upper extremities. Strength was difficult to assess because the patient has trouble following commands. CT performed today showed small subacute to old high left parietal cortical infarct new since 2011, atrophy with small vessel chronic ischemic changes of deep cerebral white matter and old lacunar infarcts in the left basal ganglia and right thalamus. Highest on the DDx for FTT includes depression, malignancy, and stroke.  -MRI scheduled to evaluate new CT findings -Consult neuro -Consider CT abdomen/pelvis to look for occult malignancy pending MRI results -PT/OT consult -Speech consult  Elevated lipase:  Patient's lipase level is elevated at 164. Patient denies any epigastric pain or pain radiating  to his back. Serum amylase is normal. Highest on the differential for patient's isolated lipase level are renal failure, pancreatic tumor, chronic pancreatitis, or bowel obstruction. -Check repeat lipase in AM  HTN: Resume lisinopril 5 mg po qd if patient becomes hypertensive -Lovenox 40 mg injection qd for prophylaxis  GERD: Continue patient on home dose of protonix 40 mg.  HLD: Lipid panel ordered for tomorrow.  This is a Psychologist, occupational Note.  The care of the patient was discussed with Dr. Senaida Ores and the assessment and plan was formulated with their assistance.  Please see their note for official documentation of the patient encounter.   Signed: Alford Highland, Med Student 01/28/2014, 6:44 PM

## 2014-01-28 NOTE — ED Notes (Signed)
Pt monitored by blood pressure, pulse ox, and 12 lead.

## 2014-01-28 NOTE — ED Provider Notes (Addendum)
EKG had no CSN: [id number]     Arrival date & time 01/28/14  4098 History   First MD Initiated Contact with Patient 01/28/14 [phone]     Chief Complaint  Patient presents with  . not eating      (Consider location/radiation/quality/duration/timing/severity/associated sxs/prior Treatment) The history is provided by a relative. The history is limited by the condition of the patient.   80 year old male brought in by son and daughter. For centrally failure to thrive. Patient has not had anything to eat or drink for the past 2 days. They felt like he was having some breathing problems are not sure what's wrong with him. Patient is slow to [place] over the last several months. Fall would get out and about on a scooter. Bases been bedridden here of late difficult to get on his feet. Patient has a history of coronary artery disease with a CABG in the past also has a history of diabetes. Patient really not taking his medicines consistently. The family thought also that he was started complaining of some pain all over not able to localize it.  Level V caveat applies to the history due to the patient not being able to answer questions. The patient is awake but will not answer questions.    Past Medical History  Diagnosis Date  . Diabetes mellitus without complication   . Hypertension   . Stroke   . Arthritis    Past Surgical History  Procedure Laterality Date  . Cardiac surgery     No family history on file. History  Substance Use Topics  . Smoking status: Former Games developer  . Smokeless tobacco: Not on file  . Alcohol Use: No    Review of Systems  Unable to perform ROS  patient able not answer questions level V caveat applies.    Allergies  Ramipril  Home Medications   Prior to Admission medications   Medication Sig Start Date End Date Taking? Authorizing Provider  lisinopril (PRINIVIL,ZESTRIL) 5 MG tablet Take 5 mg by mouth daily. 12/05/13  Yes Historical Provider, MD  lovastatin  (MEVACOR) 20 MG tablet Take 20 mg by mouth daily. 12/05/13  Yes Historical Provider, MD  metFORMIN (GLUCOPHAGE) 500 MG tablet Take 500 mg by mouth daily. 12/05/13  Yes Historical Provider, MD  omeprazole (PRILOSEC) 20 MG capsule Take 20 mg by mouth daily. 12/05/13  Yes Historical Provider, MD   BP 150/82  Pulse 104  Temp(Src) 98.9 F (37.2 C) (Oral)  Resp 19  Ht  (1.651 m)  Wt 200 lb (90.719 kg)  BMI 33.28 kg/m2  SpO2 99% Physical Exam  Nursing note and vitals reviewed. Constitutional: He appears well-developed and well-nourished. No distress.  HENT:  Head: Normocephalic and atraumatic.  Eyes: Conjunctivae and EOM are normal. Pupils are equal, round, and reactive to light.  Neck: Normal range of motion.  Cardiovascular: Normal rate, regular rhythm and normal heart sounds.   No murmur heard. Pulmonary/Chest: Effort normal and breath sounds normal. No respiratory distress.  Abdominal: Soft. Bowel sounds are normal. He exhibits no distension. There is no tenderness.  Musculoskeletal: Normal range of motion. He exhibits no edema.  Neurological: He exhibits normal muscle tone. Coordination normal.  Patient somewhat somnolent but awake. Will follow some commands.  Skin: Skin is warm. No rash noted.    ED Course  Procedures (including critical care time) Labs Review Labs Reviewed  COMPREHENSIVE METABOLIC PANEL - Abnormal; Notable for the following:    Sodium 127 (*)  Chloride 89 (*)    Glucose, Bld 421 (*)    BUN 48 (*)    Creatinine, Ser 1.54 (*)    Albumin 3.3 (*)    GFR calc non Af Amer 41 (*)    GFR calc Af Amer 48 (*)    Anion gap 18 (*)    All other components within normal limits  LIPASE, BLOOD - Abnormal; Notable for the following:    Lipase 164 (*)    All other components within normal limits  PRO B NATRIURETIC PEPTIDE  CBC WITH DIFFERENTIAL  URINALYSIS, ROUTINE W REFLEX MICROSCOPIC  I-STAT TROPOININ, ED   Results for orders placed during the hospital encounter  of 01/28/14  COMPREHENSIVE METABOLIC PANEL      Result Value Ref Range   Sodium 127 (*) 137 - 147 mEq/L   Potassium 5.3  3.7 - 5.3 mEq/L   Chloride 89 (*) 96 - 112 mEq/L   CO2 20  19 - 32 mEq/L   Glucose, Bld 421 (*) 70 - 99 mg/dL   BUN 48 (*) 6 - 23 mg/dL   Creatinine, Ser 1.61 (*) 0.50 - 1.35 mg/dL   Calcium 9.8  8.4 - 09.6 mg/dL   Total Protein 8.3  6.0 - 8.3 g/dL   Albumin 3.3 (*) 3.5 - 5.2 g/dL   AST 24  0 - 37 U/L   ALT 23  0 - 53 U/L   Alkaline Phosphatase 68  39 - 117 U/L   Total Bilirubin 0.5  0.3 - 1.2 mg/dL   GFR calc non Af Amer 41 (*) >90 mL/min   GFR calc Af Amer 48 (*) >90 mL/min   Anion gap 18 (*) 5 - 15  LIPASE, BLOOD      Result Value Ref Range   Lipase 164 (*) 11 - 59 U/L  PRO B NATRIURETIC PEPTIDE      Result Value Ref Range   Pro B Natriuretic peptide (BNP) 438.6  0 - 450 pg/mL  CBC WITH DIFFERENTIAL      Result Value Ref Range   WBC 7.4  4.0 - 10.5 K/uL   RBC 4.82  4.22 - 5.81 MIL/uL   Hemoglobin 14.2  13.0 - 17.0 g/dL   HCT 04.5  40.9 - 81.1 %   MCV 83.6  78.0 - 100.0 fL   MCH 29.5  26.0 - 34.0 pg   MCHC 35.2  30.0 - 36.0 g/dL   RDW 91.4  78.2 - 95.6 %   Platelets 157  150 - 400 K/uL   Neutrophils Relative % 74  43 - 77 %   Neutro Abs 5.4  1.7 - 7.7 K/uL   Lymphocytes Relative 18  12 - 46 %   Lymphs Abs 1.3  0.7 - 4.0 K/uL   Monocytes Relative 8  3 - 12 %   Monocytes Absolute 0.6  0.1 - 1.0 K/uL   Eosinophils Relative 0  0 - 5 %   Eosinophils Absolute 0.0  0.0 - 0.7 K/uL   Basophils Relative 0  0 - 1 %   Basophils Absolute 0.0  0.0 - 0.1 K/uL  I-STAT TROPOININ, ED      Result Value Ref Range   Troponin i, poc 0.03  0.00 - 0.08 ng/mL   Comment 3              Imaging Review Dg Chest 2 View  01/28/2014   CLINICAL DATA:  Shortness of breath and weakness with history of diabetes  and previous tobacco use. PA and lateral chest x-ray of May 27, 2010.  EXAM: CHEST  2 VIEW  COMPARISON:  PA and lateral chest of May 27, 2010  FINDINGS: The  lungs are adequately inflated and clear. The heart and pulmonary vascularity are normal. The patient has undergone previous CABG. There are 6 intact sternal wires present. There is no pleural effusion. The bony thorax is unremarkable.  IMPRESSION: There is no CHF nor other acute cardiopulmonary abnormality.   Electronically Signed   By: David  [place]   On: 01/28/2014 11:05   Ct Head Wo Contrast  01/28/2014   CLINICAL DATA:  Decreased oral intake, numerous falls, history hypertension, diabetes, stroke  EXAM: CT HEAD WITHOUT CONTRAST  TECHNIQUE: Contiguous axial images were obtained from the base of the skull through the vertex without intravenous contrast.  COMPARISON:  07/25/2009  FINDINGS: Generalized atrophy.  Normal ventricular morphology.  No midline shift or mass effect.  Small vessel chronic ischemic changes of deep cerebral white matter.  Old LEFT basal ganglia and RIGHT thalamic lacunar infarcts.  Small cortical infarct high LEFT parietal region appear subacute to old in age though new since prior CT.  No intracranial hemorrhage, mass lesion, or additional acute infarction.  Benign-appearing BILATERAL basal ganglia calcifications.  No extra-axial fluid collections.  Visualized paranasal sinuses and mastoid air cells clear.  Bones unremarkable.  IMPRESSION: Small subacute to old high LEFT parietal cortical infarct new since 2011.  Atrophy with small vessel chronic ischemic changes of deep cerebral white matter.  Old lacunar infarcts LEFT basal ganglia and RIGHT thalamus.   Electronically Signed   By: Ulyses Southward M.D.   On: 01/28/2014 10:58     EKG Interpretation   Date/Time:  Monday January 28 2014 13:33:15 EDT Ventricular Rate:  101 PR Interval:  142 QRS Duration: 81 QT Interval:  342 QTC Calculation: 443 R Axis:   44 Text Interpretation:  Fast sinus arrhythmia Low voltage, extremity leads  Confirmed by Amarise Lillo  MD, Kruze Atchley ([id number]) on 01/28/2014 1:36:13 PM      MDM   Final diagnoses:   Dehydration  CVA (cerebral vascular accident)  Hyperglycemia    Patient brought in by family essentially for failure to thrive. The patient has not been in ED nor drinking and eating significantly now for 2 days. Patient is sleeping a lot. Patient also has been becoming more and more bedridden. Workup here today she is consistent with dehydration. There's some hypo-H. cream he. There's some renal insufficiency with elevation in BUN all consistent with dehydration. In addition patient's head CT raises concerns for a subacute infarct. 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 Patient definitely Z. strokes in the past that 7 on head CT. Chest x-ray no acute cardiopulmonary problems. Troponin is not elevated, EKG without acute changes. No evidence of acute cardiac event. No significant anemia or leukocytosis. Electrolyte abnormalities as explained. No significant liver function test abnormalities. Lipase is moderately elevated at 164. Patient without complaint of abdominal pain however. In addition no history of nausea vomiting. Suspect the patient has some sort of global dementia not formally diagnosed. Patient has elevated glucose no evidence of metabolic acidosis. Patient probably not been taking his medications. We'll give him a little bit of regular IV insulin.  Patient now at about 1:30 in the afternoon his more alert. But not verbalizing much.  Discussed with teaching service patient the will be admitted temporary middle orders completed.   Vanetta Mulders, MD 01/28/14 1327  Lorin Picket  Deretha Emory, MD 01/28/14 1336

## 2014-01-28 NOTE — ED Notes (Signed)
Pt remains monitored by blood pressure, pulse ox, and 12 lead.  

## 2014-01-28 NOTE — H&P (Signed)
  I have seen and examined the patient myself, and I have reviewed the note by Alford Highland, MS III and was present during the interview and physical exam.  Please see my separate H&P for additional findings, assessment, and plan.   Signed: Jill Alexanders, DO PGY-1 Internal Medicine Resident Pager # [phone] 01/28/2014 8:46 PM

## 2014-01-28 NOTE — Progress Notes (Signed)
Spoke to patient's son about NPO status.  Explained that patient is NPO and is not allowed to have anything by mouth. Son stated that "I will feed my father I'm not gonna let him go hungry and be dehydrated."  Also explained that the patient has a swallow evaluation ordered for tomorrow and that needs to get done prior to the patient eating.  Explained that the patient is at risk for aspiration pna if patient eats prior to that evaluation.   Informed RN Carollee Herter of the conversation so she can continue to monitor the situation.    Danne Harbor, RN, BSN, MPA at 2027 on 01/28/14

## 2014-01-28 NOTE — ED Notes (Signed)
Report to 5 [facility] ---

## 2014-01-28 NOTE — H&P (Signed)
Date: 01/28/2014               Patient Name:  Colin [name] MRN: [id number]  DOB: Apr 28, 1933 Age / Sex: 80 y.o., male   PCP: Romero Belling, MD         Medical Service: Internal Medicine Teaching Service         Attending Physician: Dr. Aletta Edouard, MD    First Contact: Dr. Senaida Ores Pager: [phone]  Second Contact: Dr. Shirlee Latch Pager: [phone]       After Hours (After 5p/  First Contact Pager: [phone]  weekends / holidays): Second Contact Pager: [phone]   Chief Complaint:   History of Present Illness: [name] is a 80 YO man with a PMH of DM2, CAD with CABGx4 in 1997, HTN, stroke, and arthritis who was brought to the ED today by his family because they were concerned about dehydration and the patient's declining health over the past 2 months. The patient has had nothing to eat or drink over the past 2 days and that he has become increasingly sleepy, had an irregular breathing pattern, and that he complained of "all over" pain over the past 2 days. They state that they took a road trip with the patient about 2 months ago and since then, his cognitive and physical abilities have greatly declined. The patient has refused to take any of his medications for the past month. The patient is only able to speak only 1-2 words at a time and that requires much effort and persuasion. [name] has been falling more frequently at home when he tries to get out of bed on his own. He uses a scooter at home to get around and his son-in-law frequently has to pick him up to move anywhere. Over the past 1-2 months, the patient has experienced an unexplained 10 pound weight loss. Patient wears diapers and his family denies seeing any blood in his stool. They are unsure of his last colonoscopy. His family reports that [name]'s mood has significantly worsened over the past 2 months as well. He used to be cheerful and talkative, but now he becomes tearful and rarely speaks. He has been diagnosed with  depression in the past, but has not been taking his medications. Two months ago, patient was talkative, humorous, mobile, and only needed minimal help with ADLs. Patient endorses headache and occasional cough. He denies any abdominal pain, nausea, vomiting, diarrhea, dysuria, hematuria, hematochezia, or leg edema.   Level V Caveat: All information was provided by the patient's daughter and son-in-law who care for [name].   Meds: Current Facility-Administered Medications  Medication Dose Route Frequency Provider Last Rate Last Dose  . 0.9 %  sodium chloride infusion   Intravenous Continuous Annett Gula, MD      . enoxaparin (LOVENOX) injection 40 mg  40 mg Subcutaneous Q24H Annett Gula, MD      . insulin aspart (novoLOG) injection 0-9 Units  0-9 Units Subcutaneous TID WC Annett Gula, MD   7 Units at 01/28/14 1847  . pantoprazole (PROTONIX) injection 40 mg  40 mg Intravenous Q24H Annett Gula, MD   40 mg at 01/28/14 1843    Allergies: Allergies as of 01/28/2014 - Review Complete 01/28/2014  Allergen Reaction Noted  . Ramipril     Past Medical History  Diagnosis Date  . Diabetes mellitus without complication   . Hypertension   . Stroke   . Arthritis    Past Surgical History  Procedure Laterality Date  . Cardiac surgery     No family history on file. History   Social History  . Marital Status: Married    Spouse Name: N/A    Number of Children: N/A  . Years of Education: N/A   Occupational History  . Not on file.   Social History Main Topics  . Smoking status: Former Games developer  . Smokeless tobacco: Not on file  . Alcohol Use: No  . Drug Use: No  . Sexual Activity: Not on file   Other Topics Concern  . Not on file   Social History Narrative  . No narrative on file    Review of Systems: Level V Caveat  Denies abdominal pain.  Physical Exam: Filed Vitals:   01/28/14 1415 01/28/14 1437 01/28/14 1500 01/28/14 1551  BP: 184/95 145/94 141/88 141/80    Pulse:  98 100 88  Temp:    97.7 F (36.5 C)  TempSrc:    Oral  Resp: Height:     (1.651 m)  Weight:    90.719 kg (200 lb)  SpO2:  98% 98% 100%   General: Vital signs reviewed.  Patient is well-developed and well-nourished, in no acute distress and minimally cooperative with exam.  Head: Normocephalic and atraumatic. Eyes: Unable to assess fully if EOMI, patient would not fully cooperate, conjunctivae normal, no scleral icterus. PERRLA Neck: Supple, trachea midline, normal ROM, no JVD, masses, thyromegaly, or carotid bruit present.  Cardiovascular: RRR, S1 normal, S2 normal, no murmurs, gallops, or rubs. Pulmonary/Chest: Clear to auscultation bilaterally, no wheezes, rales, or rhonchi. Patient did not give good inspiratory/expiratory effort. Abdominal: Soft, non-tender, non-distended, BS +, no guarding present. Healing candidal intertrigo in abdominal folds.  Musculoskeletal: No joint deformities, erythema, or stiffness, ROM full and nontender. Extremities: No lower extremity edema in left lower extremity, 1+ edema in right lower extremity,  pulses symmetric and intact bilaterally. No cyanosis or clubbing. Neurological: Mute, unable to assess orientation. Decreased strength 3/5 in upper and lower extremities, unable to fully assess cranial nerve II-XII due to lack of effort and inability to follow commands. Skin: Warm, dry and intact. No rashes or erythema. Psychiatric: Abnormal mood and affect, patient does not speak, becomes easily frustrated and will start crying. speech and behavior are abnormal. Cognition and memory are abnormal.   Lab results: Basic Metabolic Panel:  Recent Labs  40/98/11 1130  NA 127*  K 5.3  CL 89*  CO2 20  GLUCOSE 421*  BUN 48*  CREATININE 1.54*  CALCIUM 9.8   Liver Function Tests:  Recent Labs  01/28/14 1130  AST 24  ALT 23  ALKPHOS 68  BILITOT 0.5  PROT 8.3  ALBUMIN 3.3*    Recent Labs  01/28/14 1130  LIPASE 164*    CBC:  Recent Labs  01/28/14 1130  WBC 7.4  NEUTROABS 5.4  HGB 14.2  HCT 40.3  MCV 83.6  PLT 157   BNP:  Recent Labs  01/28/14 1130  PROBNP 438.6   Imaging results:  Dg Chest 2 View  01/28/2014   CLINICAL DATA:  Shortness of breath and weakness with history of diabetes and previous tobacco use. PA and lateral chest x-ray of May 27, 2010.  EXAM: CHEST  2 VIEW  COMPARISON:  PA and lateral chest of May 27, 2010  FINDINGS: The lungs are adequately inflated and clear. The heart and pulmonary vascularity are normal. The patient has undergone previous CABG. There are 6  intact sternal wires present. There is no pleural effusion. The bony thorax is unremarkable.  IMPRESSION: There is no CHF nor other acute cardiopulmonary abnormality.   Electronically Signed   By: David  [place]   On: 01/28/2014 11:05   Ct Head Wo Contrast  01/28/2014   CLINICAL DATA:  Decreased oral intake, numerous falls, history hypertension, diabetes, stroke  EXAM: CT HEAD WITHOUT CONTRAST  TECHNIQUE: Contiguous axial images were obtained from the base of the skull through the vertex without intravenous contrast.  COMPARISON:  07/25/2009  FINDINGS: Generalized atrophy.  Normal ventricular morphology.  No midline shift or mass effect.  Small vessel chronic ischemic changes of deep cerebral white matter.  Old LEFT basal ganglia and RIGHT thalamic lacunar infarcts.  Small cortical infarct high LEFT parietal region appear subacute to old in age though new since prior CT.  No intracranial hemorrhage, mass lesion, or additional acute infarction.  Benign-appearing BILATERAL basal ganglia calcifications.  No extra-axial fluid collections.  Visualized paranasal sinuses and mastoid air cells clear.  Bones unremarkable.  IMPRESSION: Small subacute to old high LEFT parietal cortical infarct new since 2011.  Atrophy with small vessel chronic ischemic changes of deep cerebral white matter.  Old lacunar infarcts LEFT basal ganglia  and RIGHT thalamus.   Electronically Signed   By: Ulyses Southward M.D.   On: 01/28/2014 10:58    Other results: EKG: Fast sinus arrythmia  Assessment & Plan by Problem:  Failure to thrive/new onset dysarthria/dysphagia/weakness: Patient's family reports an acute decline in the patient's physical and cognitive abilities over the past 2 months. He is more depressed, falling more around the house, refusing to take his medications, and has recently stopped eating or drinking. He has significantly decreased strength in his lower extremities and decreased strength in his upper extremities. Strength was difficult to assess because the patient has trouble following commands. CT head performed today showed small subacute to old high left parietal cortical infarct new since 2011, atrophy with small vessel chronic ischemic changes of deep cerebral white matter and old lacunar infarcts in the left basal ganglia and right thalamus. Highest on the DDx for FTT includes depression, malignancy, stroke, cognitive decline due to vascular dementia or Alzheimer's dementia.  -MRI scheduled to evaluate new CT findings  -Neuro consulted, will see patient after MRI -Consider CT abdomen/pelvis to look for occult malignancy pending MRI results (see elevated lipase below) -PT/OT consult  -SLP consult  -Social work consult  Dehydration/Hyponatremia: Patient's family reports that he has been unwilling to eat or drink for two 2 days. Based on his age, weight, and Na+ level, the patient's free water deficit is calculated to be 4.2 L. His serum osmolality was calculated at 295. Serum Na+ was 127. Serum glucose was 421. Patient's hyponatremia likely to be caused by his fluid deficit.  -NS at 125 mL/hr  -BMET Q6H  -Cardiac monitoring  -Daily weights  -NPO  -Routine I/O's  -Urinalysis -Blood Osmolality -Urine Osmolality  Hyperglycemia/DM2: Patient takes Metformin 500 mg po daily at home. Family states that he hasn't been taking  medications regularly. CBG on admission was 421. AG elevated at 18. Patient's last HgbA1c was 8.1 on 03/19/2010. -Currently NPO  -Sliding scale insulin-sensitive -HbA1c pending -CBG 4 times daily  Elevated lipase: Patient's lipase level is elevated at 164. Patient denies any epigastric pain or pain radiating to his back. Abdominal exam is unremarkable. Serum amylase is normal. Isolated lipase level can be due to renal failure, pancreatic tumor, chronic pancreatitis, or  bowel obstruction. Patient has no history of pancreatitis and does not currently use alcohol. He wears diapers and family states he has normal, regular bowel movements.  -Check repeat lipase in AM  -Consider CT abdomen/pelvis tomorrow  HTN: Patient is hypertensive on admission 130s-180s/80s. Patient is on lisinopril 5 mg po daily at home, but has not been taking medications. -Consider restarting lisinopril 5 mg po qd if patient passes swallow screen  GERD: Patient is on protonix 40 mg at home. -Continue protonix  HLD: Lipid panel on 10/29/2009 showed Cholesterol 160, TG 94, HDL 45, LDL 96. Repeat lipid panel ordered for tomorrow.  -Lipid panel  DVT/PE ppx: Lovenox 40 mg SQ daily  Dispo: Disposition is deferred at this time, awaiting improvement of current medical problems. Anticipated discharge in approximately 1-2 day(s).   The patient does have a current PCP Romero Belling, MD) and does not need an [facility] hospital follow-up appointment after discharge.  The patient does not have transportation limitations that hinder transportation to clinic appointments.  Signed: Jill Alexanders, DO PGY-1 Internal Medicine Resident Pager # [phone] 01/28/2014 8:15 PM

## 2014-01-28 NOTE — ED Notes (Signed)
Pt remains monitored by blood pressure, pulse ox, and 12 lead. Pts family remains at bedside.  

## 2014-01-28 NOTE — ED Notes (Addendum)
Pt family takes care of pt at home, pt is verbal--but is slow to respond. Alert to place, person, situation, but not date or time. Per family-- this is baseline. Pt has been "bedridden" for the past couple weeks, and has not been eating or drinking well since that time. Pt not on any special diet at home.,

## 2014-01-29 DIAGNOSIS — I517 Cardiomegaly: Secondary | ICD-10-CM

## 2014-01-29 DIAGNOSIS — I639 Cerebral infarction, unspecified: Secondary | ICD-10-CM | POA: Diagnosis present

## 2014-01-29 HISTORY — DX: Cerebral infarction, unspecified: I63.9

## 2014-01-29 HISTORY — PX: TRANSTHORACIC ECHOCARDIOGRAM: SHX275

## 2014-01-29 LAB — BASIC METABOLIC PANEL
ANION GAP: 15 (ref 5–15)
Anion gap: 16 — ABNORMAL HIGH (ref 5–15)
Anion gap: 15 (ref 5–15)
Anion gap: 14 (ref 5–15)
BUN: 38 mg/dL — AB (ref 6–23)
BUN: 35 mg/dL — ABNORMAL HIGH (ref 6–23)
BUN: 35 mg/dL — AB (ref 6–23)
BUN: 34 mg/dL — ABNORMAL HIGH (ref 6–23)
CALCIUM: 8.8 mg/dL (ref 8.4–10.5)
CALCIUM: 8.3 mg/dL — AB (ref 8.4–10.5)
CHLORIDE: 94 meq/L — AB (ref 96–112)
CO2: 22 meq/L (ref 19–32)
CO2: 19 meq/L (ref 19–32)
CO2: 19 mEq/L (ref 19–32)
CO2: 20 mEq/L (ref 19–32)
CREATININE: 1.38 mg/dL — AB (ref 0.50–1.35)
CREATININE: 1.37 mg/dL — AB (ref 0.50–1.35)
Calcium: 9.3 mg/dL (ref 8.4–10.5)
Calcium: 8.4 mg/dL (ref 8.4–10.5)
Chloride: 96 mEq/L (ref 96–112)
Chloride: 93 mEq/L — ABNORMAL LOW (ref 96–112)
Chloride: 93 mEq/L — ABNORMAL LOW (ref 96–112)
Creatinine, Ser: 1.38 mg/dL — ABNORMAL HIGH (ref 0.50–1.35)
Creatinine, Ser: 1.5 mg/dL — ABNORMAL HIGH (ref 0.50–1.35)
GFR calc Af Amer: 55 mL/min — ABNORMAL LOW (ref >90)
GFR calc Af Amer: 55 mL/min — ABNORMAL LOW (ref >90)
GFR calc Af Amer: 49 mL/min — ABNORMAL LOW (ref >90)
GFR calc non Af Amer: 47 mL/min — ABNORMAL LOW (ref >90)
GFR calc non Af Amer: 47 mL/min — ABNORMAL LOW (ref >90)
GFR calc non Af Amer: 47 mL/min — ABNORMAL LOW (ref >90)
GFR calc non Af Amer: 43 mL/min — ABNORMAL LOW (ref >90)
GFR, EST AFRICAN AMERICAN: 55 mL/min — AB (ref >90)
GLUCOSE: 211 mg/dL — AB (ref 70–99)
Glucose, Bld: 202 mg/dL — ABNORMAL HIGH (ref 70–99)
Glucose, Bld: 423 mg/dL — ABNORMAL HIGH (ref 70–99)
Glucose, Bld: 333 mg/dL — ABNORMAL HIGH (ref 70–99)
POTASSIUM: 4.2 meq/L (ref 3.7–5.3)
Potassium: 4.1 mEq/L (ref 3.7–5.3)
Potassium: 4.5 mEq/L (ref 3.7–5.3)
Potassium: 4.1 mEq/L (ref 3.7–5.3)
SODIUM: 131 meq/L — AB (ref 137–147)
Sodium: 131 mEq/L — ABNORMAL LOW (ref 137–147)
Sodium: 127 mEq/L — ABNORMAL LOW (ref 137–147)
Sodium: 127 mEq/L — ABNORMAL LOW (ref 137–147)

## 2014-01-29 LAB — URINALYSIS, ROUTINE W REFLEX MICROSCOPIC
BILIRUBIN URINE: NEGATIVE
Glucose, UA: >1000 mg/dL — AB
Ketones, ur: 15 mg/dL — AB
Nitrite: NEGATIVE
Protein, ur: NEGATIVE mg/dL
SPECIFIC GRAVITY, URINE: 1.024 (ref 1.005–1.030)
UROBILINOGEN UA: 0.2 mg/dL (ref 0.0–1.0)
pH: 5 (ref 5.0–8.0)

## 2014-01-29 LAB — URINE MICROSCOPIC-ADD ON

## 2014-01-29 LAB — LIPID PANEL
CHOLESTEROL: 214 mg/dL — AB (ref 0–200)
HDL: 33 mg/dL — ABNORMAL LOW (ref >39)
LDL Cholesterol: 160 mg/dL — ABNORMAL HIGH (ref 0–99)
TRIGLYCERIDES: 103 mg/dL (ref <150)
Total CHOL/HDL Ratio: 6.5 RATIO
VLDL: 21 mg/dL (ref 0–40)

## 2014-01-29 LAB — OSMOLALITY: Osmolality: 291 mOsm/kg (ref 275–300)

## 2014-01-29 LAB — HEMOGLOBIN A1C
Hgb A1c MFr Bld: 14 % — ABNORMAL HIGH (ref <5.7)
Mean Plasma Glucose: 355 mg/dL — ABNORMAL HIGH (ref <117)

## 2014-01-29 LAB — GLUCOSE, CAPILLARY
GLUCOSE-CAPILLARY: 230 mg/dL — AB (ref 70–99)
Glucose-Capillary: 363 mg/dL — ABNORMAL HIGH (ref 70–99)

## 2014-01-29 LAB — OSMOLALITY, URINE: OSMOLALITY UR: 628 mosm/kg (ref 390–1090)

## 2014-01-29 LAB — LIPASE, BLOOD: Lipase: 54 U/L (ref 11–59)

## 2014-01-29 MED ORDER — LIVING WELL WITH DIABETES BOOK
Freq: Once | Status: AC
  Administered 2014-01-29: 15:00:00
  Filled 2014-01-29: qty 1

## 2014-01-29 MED ORDER — ASPIRIN 325 MG PO TABS
325.0000 mg | ORAL_TABLET | Freq: Every day | ORAL | Status: DC
  Administered 2014-01-29 – 2014-01-31 (×3): 325 mg via ORAL
  Filled 2014-01-29 (×3): qty 1

## 2014-01-29 MED ORDER — CETYLPYRIDINIUM CHLORIDE 0.05 % MT LIQD
7.0000 mL | Freq: Two times a day (BID) | OROMUCOSAL | Status: DC
  Administered 2014-01-29 – 2014-01-31 (×4): 7 mL via OROMUCOSAL

## 2014-01-29 MED ORDER — ASPIRIN 300 MG RE SUPP
300.0000 mg | Freq: Every day | RECTAL | Status: DC
  Filled 2014-01-29: qty 1

## 2014-01-29 MED ORDER — PANTOPRAZOLE SODIUM 40 MG PO TBEC
40.0000 mg | DELAYED_RELEASE_TABLET | Freq: Every day | ORAL | Status: DC
  Administered 2014-01-30 – 2014-01-31 (×2): 40 mg via ORAL
  Filled 2014-01-29 (×2): qty 1

## 2014-01-29 MED ORDER — INSULIN ASPART 100 UNIT/ML ~~LOC~~ SOLN: 0.0000 [IU] | Freq: Every day | SUBCUTANEOUS | Status: DC

## 2014-01-29 MED ORDER — INSULIN GLARGINE 100 UNIT/ML ~~LOC~~ SOLN
10.0000 [IU] | Freq: Every day | SUBCUTANEOUS | Status: DC
Start: 2014-01-29 — End: 2014-01-30
  Administered 2014-01-30: 10 [IU] via SUBCUTANEOUS
  Filled 2014-01-29 (×2): qty 0.1

## 2014-01-29 MED ORDER — SIMVASTATIN 80 MG PO TABS
80.0000 mg | ORAL_TABLET | Freq: Every day | ORAL | Status: DC
  Administered 2014-01-29: 80 mg via ORAL
  Filled 2014-01-29 (×2): qty 1

## 2014-01-29 MED ORDER — SODIUM CHLORIDE 0.9 % IV SOLN: INTRAVENOUS | Status: AC

## 2014-01-29 MED ORDER — INSULIN ASPART 100 UNIT/ML ~~LOC~~ SOLN: 0.0000 [IU] | Freq: Three times a day (TID) | SUBCUTANEOUS | Status: DC

## 2014-01-29 MED ORDER — INSULIN ASPART 100 UNIT/ML ~~LOC~~ SOLN
0.0000 [IU] | Freq: Three times a day (TID) | SUBCUTANEOUS | Status: DC
  Administered 2014-01-29: 15 [IU] via SUBCUTANEOUS
  Administered 2014-01-30: 3 [IU] via SUBCUTANEOUS
  Administered 2014-01-30: 2 [IU] via SUBCUTANEOUS
  Administered 2014-01-30: 11 [IU] via SUBCUTANEOUS
  Administered 2014-01-31: 2 [IU] via SUBCUTANEOUS
  Administered 2014-01-31: 5 [IU] via SUBCUTANEOUS
  Administered 2014-01-31: 8 [IU] via SUBCUTANEOUS

## 2014-01-29 MED ORDER — INSULIN ASPART 100 UNIT/ML ~~LOC~~ SOLN
0.0000 [IU] | Freq: Every day | SUBCUTANEOUS | Status: DC
  Administered 2014-01-29: 2 [IU] via SUBCUTANEOUS

## 2014-01-29 MED ORDER — INSULIN STARTER KIT- SYRINGES (ENGLISH)
1.0000 | Freq: Once | Status: AC
  Administered 2014-01-30: 1
  Filled 2014-01-29: qty 1

## 2014-01-29 MED ORDER — SODIUM CHLORIDE 0.9 % IV SOLN
INTRAVENOUS | Status: AC
  Administered 2014-01-29: via INTRAVENOUS
  Administered 2014-01-29: 1000 mL via INTRAVENOUS

## 2014-01-29 NOTE — Progress Notes (Signed)
Inpatient Diabetes Program Recommendations  AACE/ADA: New Consensus Statement on Inpatient Glycemic Control (2013)  Target Ranges:  Prepandial:   less than 140 mg/dL      Peak postprandial:   less than 180 mg/dL (1-2 hours)      Critically ill patients:  140 - 180 mg/dL   Reason for Visit: Diabetes Coordinator Consult due to being new to insulin  Diabetes history: Type 2 diabetes Outpatient Diabetes medications: Glucophage 500 mg daily Current orders for Inpatient glycemic control: Lantus 10 units (patient refused), Novolog Moderate correction tid (patient has refused CBG's and insulin today), and Novolog HS scale  Note:  Informed by nurse prior to my entering room that patient has been refusing insulin and CBG's today.  Lab glucose at 1310 came back at 423 mg/dl.  Daughter and son in law not present-- have gone to get patient something to eat.  PT note indicates family refuses SNF.  Attempted to speak with patient but he ignored me.  When asked if he felt like talking, he shook his head "no".  Excused self from room.  Assume daughter and son in law will need to give insulin and manage his diabetes at discharge.  Will likely need Home Health follow-up to assist in transition of care.  Thank you.  Elizeth Weinrich S. Elsie Lincoln, RN, CNS, CDE Inpatient Diabetes Program, team pager [phone]

## 2014-01-29 NOTE — Progress Notes (Signed)
Pt refused ekg and cbg check.

## 2014-01-29 NOTE — Evaluation (Signed)
Physical Therapy Evaluation Patient Details Name: Colin [name] MRN: [id number] DOB: 07-28-1932 Today's Date: 01/29/2014   History of Present Illness  80 YO man with a PMH of DM2, CAD with CABGx4 in 1997, HTN, stroke, and arthritis who was brought to the ED today by his family because they were concerned about dehydration and the patient's declining health over the past 2 months. MRI revealed Small acute nonhemorrhagic infarcts involving the left aspect of the posterior body of the corpus callosum and left aspect of the splenium of the corpus callosum. Pt with dehydration/hyponatremia, FTT.   Clinical Impression  Patient presents with functional limitations due to deficits listed in PT problem list (see below). Pt not cooperative with PT eval. Lack of effort noted as pt does not seem interested in actively participating in therapy. Despite therapist providing cues to promote independence with mobility, pt unwilling to assist. Education provided on importance of pressure relief and repositioning to prevent bed sores. Pt will need w/c at home for safety as pt currently non ambulatory due to weakness in BLEs. Pt's functional mobility has been declining for the last few weeks per family. Discussed recommendation of HHPT with family, however daughter seems unsure at this time. Will revisit this option prior to discharge as pt has potential to improve function, if willing. Pt would benefit from skilled acute PT, if willing, to improve transfers and gait so pt can decrease burden of care on family, reduce fall risk and improve overall mobility.    Follow Up Recommendations Home health PT;Supervision/Assistance - 24 hour (Family adamantly refusing SNF.)    Equipment Recommendations  Hospital bed;Wheelchair (measurements PT);Wheelchair cushion (measurements PT)    Recommendations for Other Services       Precautions / Restrictions Precautions Precautions: Fall (at risk for skin break down due to  incontinence and immobility.) Restrictions Weight Bearing Restrictions: No      Mobility  Bed Mobility Overal bed mobility: Needs Assistance Bed Mobility: Sit to Supine     Supine to sit: Mod assist Sit to supine: Mod assist;HOB elevated   General bed mobility comments: Able to initiate LE movement with max cues but requires assist to mobilize BLEs into bed and with lowering trunk. Lacks initiation and effort.   Transfers Overall transfer level: Needs assistance   Transfers: Sit to/from Stand;Stand Pivot Transfers Sit to Stand: Max assist;From elevated surface (Pt reaches for therapist to pull up.) Stand pivot transfers: Max assist;+2 physical assistance       General transfer comment: Requires 2 person Max A for safety during SPT. Posterior lean noted. Lack of effort?- most likely demonstrating how pt transfers at home (son in law "picks pt up and carries him for transfers"). Tactile cues to place pt's hands on arm rests to assist with transfer however pt continually reaching up for therapist.  Ambulation/Gait             General Gait Details: Able to shuffle feet from chair to bed with Max A for balance due to posterior lean and instability.  Stairs            Wheelchair Mobility    Modified Rankin (Stroke Patients Only) Modified Rankin (Stroke Patients Only) Pre-Morbid Rankin Score: Moderately severe disability Modified Rankin: Severe disability     Balance Overall balance assessment: Needs assistance Sitting-balance support: Feet supported Sitting balance-Leahy Scale: Fair Sitting balance - Comments: Not able to tolerate any challenge sitting EOB.   Standing balance support: During functional activity;Bilateral upper extremity supported Standing balance-Leahy  Scale: Zero Standing balance comment: Poor static and dynamic standing balance, requiring BUe support and Max A to maintain stability/upright.                             Pertinent  Vitals/Pain Pain Assessment: No/denies pain    Home Living Family/patient expects to be discharged to:: Private residence Living Arrangements: Children Available Help at Discharge: Family;Available 24 hours/day Type of Home: Apartment Home Access: Level entry     Home Layout: One level Home Equipment: Art gallery manager      Prior Function Level of Independence: Needs assistance   Gait / Transfers Assistance Needed: nonambulatory; son-in-law "lifts and carries pt"   ADL's / Homemaking Assistance Needed: total A. set up for feeding        Hand Dominance   Dominant Hand: Right    Extremity/Trunk Assessment   Upper Extremity Assessment: Generalized weakness           Lower Extremity Assessment: Generalized weakness (Pt refusing to actively mobilize any extremities even with cues and encouragment. Focused on returning to bed. Seems irritated.)      Cervical / Trunk Assessment: Kyphotic  Communication   Communication: HOH  Cognition Arousal/Alertness: Awake/alert Behavior During Therapy: Flat affect;Agitated Overall Cognitive Status: Impaired/Different from baseline Area of Impairment: Orientation;Attention;Memory;Following commands;Safety/judgement;Awareness;Problem solving Orientation Level: Disoriented to;Place;Time;Situation Current Attention Level: Sustained Memory: Decreased short-term memory Following Commands: Follows one step commands with increased time;Follows one step commands inconsistently (and increased cuing.) Safety/Judgement: Decreased awareness of deficits;Decreased awareness of safety Awareness: Emergent Problem Solving: Slow processing;Decreased initiation;Difficulty sequencing;Requires verbal cues;Requires tactile cues General Comments: Pt not cooperative with exam, adamant and focused about getting into bed. Not following commands to assess LEs.    General Comments General comments (skin integrity, edema, etc.): Pt not cooperative with PT eval  and not willing to actively participate in assessment. Discussed option of HHPT at home however pt refuses. Family does not seem interested in therapy either after discussion with daughter.    Exercises        Assessment/Plan    PT Assessment Patient needs continued PT services  PT Diagnosis Generalized weakness;Difficulty walking   PT Problem List Decreased strength;Decreased cognition;Decreased knowledge of use of DME;Decreased activity tolerance;Decreased balance;Decreased safety awareness;Decreased mobility;Decreased knowledge of precautions  PT Treatment Interventions Balance training;Gait training;Cognitive remediation;Patient/family education;Functional mobility training;Therapeutic activities;Therapeutic exercise;Wheelchair mobility training   PT Goals (Current goals can be found in the Care Plan section) Acute Rehab PT Goals Patient Stated Goal: none stated PT Goal Formulation: Patient unable to participate in goal setting Time For Goal Achievement: 02/12/14 Potential to Achieve Goals: Fair    Frequency Min 2X/week   Barriers to discharge        Co-evaluation               End of Session Equipment Utilized During Treatment: Gait belt Activity Tolerance: Other (comment) (Pt not cooperative with exam and exhibiting lack of participation.) Patient left: in bed;with call bell/phone within reach;with bed alarm set;with family/visitor present (Family arriving at end of eval.) Nurse Communication: Mobility status;Need for lift equipment         Time: 1400-1415 PT Time Calculation (min): 15 min   Charges:   PT Evaluation $Initial PT Evaluation Tier I: 1 Procedure PT Treatments $Therapeutic Activity: 8-22 mins   PT G CodesAlvie Heidelberg A 01/29/2014, 2:44 PM Alvie Heidelberg, PT, DPT [phone]

## 2014-01-29 NOTE — Care Management Note (Addendum)
    Page 1 of 2   01/31/2014     5:29:29 PM CARE MANAGEMENT NOTE 01/31/2014  Patient:  Colin [name],Colin [name]   Account Number:  [id number]  Date Initiated:  01/29/2014  Documentation initiated by:  Letha Cape  Subjective/Objective Assessment:   dx dehydration  admit- lives with daughter,  patient has 24 hr care around the clock.     Action/Plan:   pt/ot eval- rec hhpt/hhot, family adamantly refusing snf and states does not need hh services.   Anticipated DC Date:  01/31/2014   Anticipated DC Plan:  HOME W HOME HEALTH SERVICES      DC Planning Services  CM consult      [facility] Choice  DURABLE MEDICAL EQUIPMENT   Choice offered to / List presented to:  C-4 Adult Children   DME arranged  HOSPITAL BED  WHEELCHAIR - MANUAL  OTHER - SEE COMMENT      DME agency  [facility]     HH arranged  HH-2 PT  HH-4 NURSE'S AIDE      HH agency  [facility]   Status of service:  Completed, signed off Medicare Important Message given?  YES (If response is "NO", the following Medicare IM given date fields will be blank) Date Medicare IM given:  01/31/2014 Medicare IM given by:  Letha Cape Date Additional Medicare IM given:   Additional Medicare IM given by:    Discharge Disposition:  HOME W HOME HEALTH SERVICES  Per UR Regulation:  Reviewed for med. necessity/level of care/duration of stay  If discussed at Long Length of Stay Meetings, dates discussed:    Comments:  ContactLauralyn Primes or Larwance Rote  01/31/14 1606 Letha Cape RN, BSN [phone] patient is for dc today, NCM checked with [employer] and they are delivering hospital bed, the lift and the wheelchair today, informed daughter that she will need to pay for bed today either on the phone or whent they get there with the dme.  NCM asked if patient will need ambulance the daughter and her friend stated no, they will transport patient home. Daughter also chose [facility] for HHPT and aide.  Referral made to  [facility] , Lupita Leash notified.  Soc will begin 24-48 hrs post dc. NCM spoke with MD she sent scripts to [facility], NCM called to see how much fondaparinux would be , it is 221.67, NCM informed Janese Banks and she stated she would take care of it and [facility] is there right now delivering the hospital bed, the lift and the w/chair.  01/29/14 1607 Letha Cape RN, BSN [phone] NCM spoke with daughter, Colin [name] , she states patient has 24 hr care at home and they do not need hh services.  She state they can use a hospital bed, a liift and a w/chair.  NCM informed her that  not sure if insurance will pay for a hospital bed or not.  NCM contacted rep at [facility] and he stated to fax orders in and they will see if hospital bed will be covered and they will contact the patient to let them know if it will be covered or not.  The address for DME to be delivered to is [address], [address] [place] [address].

## 2014-01-29 NOTE — Progress Notes (Addendum)
Pt refused to allow tech to get cbg, refused to let RN give lantus insulin.

## 2014-01-29 NOTE — H&P (Signed)
INTERNAL MEDICINE TEACHING ATTENDING NOTE  Day 1 of stay  Patient name: Colin [name]  MRN: [id number] Date of birth: January 19, 1933   Key clinical points and exam                                                          80 y.o.admitted with decline in daily function and cognition gradually over the past 2 months as mentioned in Dr Nena Alexander notes. He was wheelchair bound previously, Has progressively decreased PO intake, 10 lb weight loss, refusal to take medicines and signs of depression. Daughter mentions reason for depression as pressure on him by relatives for transfer of assets and DIE. He did express to his daughter that he wanted to die, and thus had stopped taking his medications. He was reluctant to move his extremities at admission, was non-verbal and was reported to have dysphagia, and MRI revealed small acute nonhemorrhagic infarcts involving the left aspect of the posterior body of the corpus callosum and left aspect of the splenium of the corpus callosum. This morning when I met him, he was alert, oriented times one (in the hospital) was minimally verbal, but did speak and answered simple questions and followed simple commands. Vitals stable, no facial droop, no cranial nerve abnormality noted. Sensations intact throughout. Motor RUE 4/5 LUE 5/5; LLE 4/5 RLE 4/5. I have reviewed the chart, lab results, EKG, imaging and relevant notes of this patient.   Assessment and Plan                                                                       Stroke work up per neurology.   Depression - We will consider starting SSRI if needed, the patient feels much improved today, he is eating well, and appears to be in a light mood.   Dementia - Alzheimer's dementia suspected. MMSE to be administered.   I have seen and evaluated this patient and discussed it with my IM resident team.  Please see the rest of the plan per resident note from today.   [place], [place] 01/29/2014, 1:38 PM.

## 2014-01-29 NOTE — Progress Notes (Signed)
Occupational Therapy Evaluation Patient Details Name: Colin [name] MRN: [id number] DOB: 25-Dec-1932 Today's Date: 01/29/2014    History of Present Illness 80 YO man with a PMH of DM2, CAD with CABGx4 in 1997, HTN, stroke, and arthritis who was brought to the ED today by his family because they were concerned about dehydration and the patient's declining health over the past 2 months. MRI revealed Small acute nonhemorrhagic infarcts involving the left aspect of the posterior body of the corpus callosum and left aspect of the splenium of the corpus callosum. Pt with dehydration/hyponatremia, FTT.   Clinical Impression   PTA, patient has required more care over the past 2 months. Prior to that, family assisted with ADL and mobility - pt used motorized scooter. Pt now requires MAX A for ADL and mobility. Family states that mobility and self care has become more difficult. Family  plans to take pt home to care for him. Pt will need a w/c with cushion and hospital bed for safe D/C home. Appropriate DME will decrease burden of care, in addition to reducing risk of falls and skin breakdown and decrease likelihood of hospital readmisssion related to falls and skin integrity. No further OT needs.    Follow Up Recommendations  No OT follow up;Supervision/Assistance - 24 hour    Equipment Recommendations  Wheelchair (measurements OT) with ELR and removable arms;Wheelchair cushion (measurements OT);Hospital bed    Recommendations for Other Services       Precautions / Restrictions Precautions Precautions: Fall;Other (comment) (at risk forskin breakdown dueto incontinence) Restrictions Weight Bearing Restrictions: No      Mobility Bed Mobility Overal bed mobility: Needs Assistance Bed Mobility: Supine to Sit     Supine to sit: Mod assist     General bed mobility comments: Pt able to assist using rails and with HOB raised. Pt needs to be repositioned q 2 hours to prevent skin breakdown due to  incontinence issues and immobility. Family reports pt has almost fallen out of the bed and that hospital bed would assist with patient safety and ability to care for the pt.  Transfers Overall transfer level: Needs assistance   Transfers: Sit to/from Stand;Stand Pivot Transfers Sit to Stand: Max assist;From elevated surface (pt pulls up - apparently pt pulls up on his caregiver) Stand pivot transfers: Max assist;+2 physical assistance       General transfer comment: at baseline    Balance Overall balance assessment: Needs assistance Sitting-balance support: Feet supported Sitting balance-Leahy Scale: Fair     Standing balance support: During functional activity;Bilateral upper extremity supported Standing balance-Leahy Scale: Zero                              ADL Overall ADL's : At baseline                                             Vision                     Perception     Praxis      Pertinent Vitals/Pain Pain Assessment: No/denies pain     Hand Dominance Right   Extremity/Trunk Assessment Upper Extremity Assessment Upper Extremity Assessment: Generalized weakness   Lower Extremity Assessment Lower Extremity Assessment: Defer to PT evaluation   Cervical / Trunk Assessment Cervical /  Trunk Assessment: Kyphotic   Communication Communication Communication: HOH   Cognition Arousal/Alertness: Awake/alert Behavior During Therapy: Flat affect Overall Cognitive Status: Impaired/Different from baseline Area of Impairment: Orientation;Attention;Memory;Following commands;Safety/judgement;Awareness;Problem solving Orientation Level: Disoriented to;Place;Time;Situation Current Attention Level: Sustained Memory: Decreased short-term memory Following Commands: Follows one step commands with increased time Safety/Judgement: Decreased awareness of safety;Decreased awareness of deficits Awareness: Emergent Problem Solving: Slow  processing;Decreased initiation;Difficulty sequencing;Requires verbal cues General Comments: Daughter reports cognition has declined over the past 2 months   General Comments       Exercises       Shoulder Instructions      Home Living Family/patient expects to be discharged to:: Private residence Living Arrangements: Children Available Help at Discharge: Family;Available 24 hours/day Type of Home: Apartment Home Access: Level entry     Home Layout: One level     Bathroom Shower/Tub: Chief Strategy Officer: Standard Bathroom Accessibility: No   Home Equipment: Art gallery manager          Prior Functioning/Environment Level of Independence: Needs assistance  Gait / Transfers Assistance Needed: nonambulatory; son-in-law "lifts and carries pt"  ADL's / Homemaking Assistance Needed: total A. set up for feeding Communication / Swallowing Assistance Needed: decreased verbalizations     Luisa Dago, OTR/L  [phone] OT Diagnosis:     OT Problem List:     OT Treatment/Interventions:      OT Goals(Current goals can be found in the care plan section) Acute Rehab OT Goals Patient Stated Goal: none stated OT Goal Formulation: With family  OT Frequency:     Barriers to D/C:            Co-evaluation              End of Session Equipment Utilized During Treatment: Gait belt Nurse Communication: Mobility status;Need for lift equipment (use stedy)  Activity Tolerance: Patient tolerated treatment well Patient left: in chair;with call bell/phone within reach;with nursing/sitter in room   Time: 1029-1104 OT Time Calculation (min): 35 min Charges:  OT General Charges $OT Visit: 1 Procedure OT Evaluation $Initial OT Evaluation Tier I: 1 Procedure OT Treatments $Self Care/Home Management : 23-37 mins G-Codes:    Dollye Glasser,HILLARY 02-25-14, 11:48 AM

## 2014-01-29 NOTE — Progress Notes (Signed)
Utilization review completed.  

## 2014-01-29 NOTE — Progress Notes (Signed)
OT Cancellation Note  Patient Details Name: Colin [name] MRN: [id number] DOB: August 15, 1932   Cancelled Treatment:    Reason Eval/Treat Not Completed: Patient at procedure or test/ unavailable Will return. [facility] Karry Barrilleaux, OTR/L  [phone] 01/29/2014 01/29/2014, 9:41 AM

## 2014-01-29 NOTE — Progress Notes (Signed)
Subjective: Colin [name] is a 80 YO man with a PMH of DM2, CAD with CABGx4 in 1997, HTN, stroke, and arthritis who was brought to the ED yesterday by his family because they were concerned about his hydration status and mental decline. Colin [name] was found to be hyponatremic and hyperglycemic. Dysarthria and dysphagia was also noted. Overnight, the patient's vitals were stable. The patient and his family were frustrated about his overnight NPO status, threatening to leave AMA. The patient refused repeat EKG, CBG, and physical exam by the nurse this morning. After speech eval at 0830, they recommeded regular texture diet and thin liquids. Patient's diet was advanced and he became much more cooperative. On exam this morning, the patient has greatly improved speech, orientation, and understanding. He still has difficulty following complex commands, but is much more alert and cooperative than yesterday evening. He has no acute complaints and denies chest pain, SOB, or abdominal pain.   Objective: Vital signs in last 24 hours: Filed Vitals:   01/28/14 1500 01/28/14 1551 01/28/14 2132 01/29/14 0540  BP: 141/88 141/80 139/87 140/85  Pulse: 100 88 96 89  Temp:  97.7 F (36.5 C) 98.4 F (36.9 C) 98.4 F (36.9 C)  TempSrc:  Oral Oral Oral  Resp: Height:   (1.651 m)    Weight:  90.719 kg (200 lb)  83.19 kg (183 lb 6.4 oz)  SpO2: 98% 100% 100% 100%   Weight change:   Intake/Output Summary (Last 24 hours) at 01/29/14 1004 Last data filed at 01/29/14 0656  Gross per 24 hour  Intake 1845.83 ml  Output    253 ml  Net 1592.83 ml   BP 140/85  Pulse 89  Temp(Src) 98.4 F (36.9 C) (Oral)  Resp 18  Ht  (1.651 m)  Wt 83.19 kg (183 lb 6.4 oz)  BMI 30.52 kg/m2  SpO2 100% General appearance: alert, cooperative, no distress and slowed mentation Head: Normocephalic, without obvious abnormality, atraumatic Eyes: conjunctivae/corneas clear, PEERL Throat: lips, mucosa, and tongue  normal; teeth and gums normal Back: symmetric, no curvature. ROM normal. No CVA tenderness. Lungs: clear to auscultation bilaterally Heart: regular rate and rhythm, S1, S2 normal, no murmur, click, rub or gallop Abdomen: soft, non-tender; bowel sounds normal; no masses,  no organomegaly Extremities: 1+ edema noted in the R leg. Healing lesion present on the R shin from recent fall. Patient reports leg tenderness bilaterally Neurologic: Mental status: alertness: alert, affect: blunted Cranial nerves: II: pupils equal, round, reactive to light and accommodation, 3 mm bilaterally, III,IV,VI: extraocular muscles unable to test - difficulty following complex commands, V: mastication normal, V: facial light touch sensation normal bilaterally, IX: soft palate elevation normal bilaterally, XI: sternocleidomastoid strength normal bilaterally, XII: tongue strength normal  Sensory: normal tactile sense in upper and lower extremities Motor: grade 4 R upper extremity grade 5 L upper extremity Grade 5 in BL lower extremities  Lab Results: Basic Metabolic Panel:  Recent Labs Lab 01/29/14 0100 01/29/14 0843  NA 131* 131*  K 4.1 4.2  CL 94* 96  CO2 22 19  GLUCOSE 211* 202*  BUN 38* 35*  CREATININE 1.38* 1.38*  CALCIUM 9.3 8.8   Liver Function Tests:  Recent Labs Lab 01/28/14 1130  AST 24  ALT 23  ALKPHOS 68  BILITOT 0.5  PROT 8.3  ALBUMIN 3.3*    Recent Labs Lab 01/28/14 1130 01/29/14 0843  LIPASE 164* 54   CBC:  Recent Labs Lab 01/28/14  1130  WBC 7.4  NEUTROABS 5.4  HGB 14.2  HCT 40.3  MCV 83.6  PLT 157   BNP:  Recent Labs Lab 01/28/14 1130  PROBNP 438.6   CBG:  Recent Labs Lab 01/28/14 1658 01/28/14 2129  GLUCAP 335* 250*   Hemoglobin A1C:  Recent Labs Lab 01/28/14 1918  HGBA1C 14.0*   Fasting Lipid Panel:  Recent Labs Lab 01/29/14 0100  CHOL 214*  HDL 33*  LDLCALC 160*  TRIG 103  CHOLHDL 6.5    Urinalysis:  Recent Labs Lab  01/29/14 0148  COLORURINE YELLOW  LABSPEC 1.024  PHURINE 5.0  GLUCOSEU >1000*  HGBUR SMALL*  BILIRUBINUR NEGATIVE  KETONESUR 15*  PROTEINUR NEGATIVE  UROBILINOGEN 0.2  NITRITE NEGATIVE  LEUKOCYTESUR LARGE*   Misc. Labs: Urine osmolarity 628  Studies/Results: Dg Chest 2 View  01/28/2014   CLINICAL DATA:  Shortness of breath and weakness with history of diabetes and previous tobacco use. PA and lateral chest x-ray of May 27, 2010.  EXAM: CHEST  2 VIEW  COMPARISON:  PA and lateral chest of May 27, 2010  FINDINGS: The lungs are adequately inflated and clear. The heart and pulmonary vascularity are normal. The patient has undergone previous CABG. There are 6 intact sternal wires present. There is no pleural effusion. The bony thorax is unremarkable.  IMPRESSION: There is no CHF nor other acute cardiopulmonary abnormality.   Electronically Signed   By: David  [place]   On: 01/28/2014 11:05   Ct Head Wo Contrast  01/28/2014   CLINICAL DATA:  Decreased oral intake, numerous falls, history hypertension, diabetes, stroke  EXAM: CT HEAD WITHOUT CONTRAST  TECHNIQUE: Contiguous axial images were obtained from the base of the skull through the vertex without intravenous contrast.  COMPARISON:  07/25/2009  FINDINGS: Generalized atrophy.  Normal ventricular morphology.  No midline shift or mass effect.  Small vessel chronic ischemic changes of deep cerebral white matter.  Old LEFT basal ganglia and RIGHT thalamic lacunar infarcts.  Small cortical infarct high LEFT parietal region appear subacute to old in age though new since prior CT.  No intracranial hemorrhage, mass lesion, or additional acute infarction.  Benign-appearing BILATERAL basal ganglia calcifications.  No extra-axial fluid collections.  Visualized paranasal sinuses and mastoid air cells clear.  Bones unremarkable.  IMPRESSION: Small subacute to old high LEFT parietal cortical infarct new since 2011.  Atrophy with small vessel chronic  ischemic changes of deep cerebral white matter.  Old lacunar infarcts LEFT basal ganglia and RIGHT thalamus.   Electronically Signed   By: Ulyses Southward M.D.   On: 01/28/2014 10:58   Mr Brain Wo Contrast  01/29/2014   CLINICAL DATA:  Decreased oral intake. History of falls. Hypertensive diabetic 80 year old male.  EXAM: MRI HEAD WITHOUT CONTRAST  TECHNIQUE: Multiplanar, multiecho pulse sequences of the brain and surrounding structures were obtained without intravenous contrast.  COMPARISON:  01/28/2014 head CT.  12/14/2005 brain MR.  FINDINGS: Small acute nonhemorrhagic infarcts involving the left aspect of the posterior body of the corpus callosum and left aspect of the splenium of the corpus callosum.  Remote infarcts basal ganglia and mid aspect of the left coronal radiata. Remote left parietal lobe infarct with encephalomalacia. Remote small thalamic infarcts bilaterally. Remote right paracentral pontine infarct. Remote left cerebellar infarct.  Prominent small vessel disease type changes.  Global atrophy without hydrocephalus.  No intracranial mass lesion noted on this unenhanced exam.  Cervical spondylotic changes C4-5. Cervical medullary junction, pituitary region, pineal region and orbital  structures unremarkable.  Major intracranial vascular structures are patent.  IMPRESSION: Small acute nonhemorrhagic infarcts involving the left aspect of the posterior body of the corpus callosum and left aspect of the splenium of the corpus callosum.  Remote infarcts and prominent small vessel disease type changes as detailed above.  Global atrophy without hydrocephalus.   Electronically Signed   By: Bridgett Larsson M.D.   On: 01/29/2014 00:25   Medications: I have reviewed the patient's current medications. Scheduled Meds: . aspirin  300 mg Rectal Daily  . enoxaparin (LOVENOX) injection  40 mg Subcutaneous Q24H  . insulin aspart  0-9 Units Subcutaneous TID WC  . pantoprazole (PROTONIX) IV  40 mg Intravenous Q24H    Continuous Infusions:  PRN Meds:. Assessment/Plan: Principal Problem:   Dehydration Active Problems:   DIABETES MELLITUS, TYPE II   Hyponatremia   Dysphagia, unspecified(787.20)   History of stroke   Mild malnutrition   Weakness  Failure to thrive/new onset dysarthria/dysphagia/weakness: Patient is much more responsive today to questioning and his physical exam has also improved. CT head performed yesterday showed small subacute to old high left parietal cortical infarct new since 2011, atrophy with small vessel chronic ischemic changes of deep cerebral white matter and old lacunar infarcts in the left basal ganglia and right thalamus. MRI yesterday showed: small acute nonhemorrhagic infarcts involving the left aspect of the posterior body of the corpus callosum and left aspect of the  splenium of the corpus callosum. Remote infarcts basal ganglia and mid aspect of the left coronal radiata were noted in addition to remote left parietal lobe infarct with encephalomalacia. Remote small thalamic infarcts present bilaterally. Remote right paracentral pontine infarct. Remote left cerebellar infarct. Prominent small vessel disease type changes.  Global atrophy without hydrocephalus. No intracranial mass lesions appreciated. Awaiting follow up from neuro consult as to how to proceed with care. Full stroke workup to be ordered to determine the source of stroke and minimize patient's risk for recurrence. Highest on the DDx for FTT includes stroke, vascular dementia, early Alzheimer's, depression, and occult malignancy. -Neuro consulted, will see patient after MRI  -Consider CT low dose chest CT if clinical findings unexplained by MRI -Echo -Carotid dopplers -Repeat EKG performed -PT/OT consult  -Social work consult   Dehydration/Hyponatremia: On admission, serum Na+ was 127 and serum glucose was 421. Patient's hyponatremia likely to be caused by his fluid deficit. Na+ up to 131 this morning,  glucose down to 250 at 2129 last night. Patient declined CBG this morning. Serum osmolality 291, urine osmolality 628. Urinalysis showed glucose >1000, ketones 15, large leuks, negative protein and negative nitrites. -NS at 75 mL/hr  -BMET Q6H  -Cardiac monitoring  -Daily weights  -Routine I/O's   Hyperglycemia/DM2: Patient takes Metformin 500 mg po daily at home. Family states that he hasn't been taking medications regularly. CBG on admission was 421 - most recent was 250 at 2159 on 01/28/14. AG slightly elevated at 16 this morning. Patient's last HgbA1c was 8.1 on 03/19/2010. Repeat HbA1c yesterday was 14.0. -Started 10 U Lantus -Carb modified diet -Sliding scale insulin-sensitive  -CBG 4 times daily  -Consult with diabetic educator for patient and family  Elevated lipase: Patient's lipase level was elevated to 164 on admission. Repeat lipase this morning was normal at 54. Can continue to follow if patient reports epigastric pain or pain radiating to his back. Elevated lipase is a non-specific finding and likely due to his anorexia, dehydration, and electrolyte abnormalities.  HTN: Patient is hypertensive  on admission 130s-180s/80s. Patient is on lisinopril 5 mg po daily at home, but has not been taking medications for approx 1 month. -HTN meds held due to stroke findings  GERD: Patient is on protonix 40 mg at home.  -Continue protonix   HLD: Lipid panel on 10/29/2009 showed Cholesterol 160, TG 94, HDL 45, LDL 96. Repeat lipid panel on 01/30/2104 showed cholesterol 214, TG 103, HDL 33, LDL 160. -Simvastatin 80 mg PO initiated  DVT/PE ppx: Lovenox 40 mg SQ daily, SCDs on and active. -R leg 1+ edema noted, patient reports BL leg soreness. -ASA  325 mg PO qd  Dispo: Disposition is deferred at this time, awaiting improvement of current medical problems. Anticipated discharge in approximately 1-2 day(s).   The patient does have a current PCP Romero Belling, MD) and does need an [facility] hospital  follow-up appointment after discharge.   The patient does not have transportation limitations that hinder transportation to clinic appointments.  This is a Psychologist, occupational Note.  The care of the patient was discussed with Dr. Isabella Bowens and the assessment and plan formulated with their assistance.  Please see their attached note for official documentation of the daily encounter.  Addendum: Visited patient's bedside - US technician obtaining 2D echo. Patient refused to speak to me - pretended to be asleep. Was planning to administer MMSE and screen for depression with SIG E CAPS screening. Will attempt to administer tomorrow if patient is in a better mood.   Spoke with diabetic educator over the phone about the patient and the family's noncompliance while he has been in the hospital. She recommended that we increase his Lantus dose to 20 U based on his weight. Also recommends discussion with family tomorrow so we can get them on board with his treatment plan.   LOS: 1 day   Alford Highland, Med Student 01/29/2014, 10:04 AM

## 2014-01-29 NOTE — Progress Notes (Signed)
Pt refused to let RN do a physical assessment, stated "no one is going to put their hands on me until I get something to eat." Pt alert to self, place and family. MD paged. Will continue to monitor.

## 2014-01-29 NOTE — Progress Notes (Signed)
  I have seen and examined the patient, and reviewed the daily progress note by Alford Highland, MS 3 and discussed the care of the patient with them. Please see my progress note from 01/29/2014 for further details regarding assessment and plan.    Signed:  Griffin Basil, MD 01/29/2014, 4:16 PM

## 2014-01-29 NOTE — Progress Notes (Signed)
Nutrition Brief Note  Patient identified on the Malnutrition Screening Tool (MST) Report  Wt Readings from Last 15 Encounters:  01/29/14 183 lb 6.4 oz (83.19 kg)  03/19/10 208 lb 12 oz (94.688 kg)  01/05/10 209 lb (94.802 kg)  10/29/09 200 lb 4 oz (90.833 kg)  10/02/09 202 lb 6.1 oz (91.8 kg)  08/13/09 200 lb (90.719 kg)  07/17/09 203 lb 2.1 oz (92.14 kg)  07/08/09 200 lb (90.719 kg)  07/01/09 202 lb 4 oz (91.74 kg)  04/02/09 189 lb 4 oz (85.843 kg)  03/17/09 172 lb 6.1 oz (78.192 kg)  03/03/09 172 lb 6.1 oz (78.192 kg)  02/18/09 178 lb (80.74 kg)  10/22/08 175 lb (79.379 kg)  10/10/08 173 lb (78.472 kg)    Body mass index is 30.52 kg/(m^2). Patient meets criteria for obesity based on current BMI.   Current diet order is carb modified, patient is consuming approximately 100% of meals at this time. Labs and medications reviewed.  Pt refused to speak with dietitian. Per documentation, appetite has improved.  No nutrition interventions warranted at this time. If nutrition issues arise, please consult RD.   Tilda Franco, MS, PLDN Provisionally Licensed Dietitian Nutritionist Pager: [phone]

## 2014-01-29 NOTE — Progress Notes (Signed)
Subjective: No acute events overnight. Patient responding to questions now and more cooperative. He reports a little chest pain x3 weeks but denies shortness of breath. Also denies fevers/chills. Reports his strength is improving. Denies nausea/vomiting or abdominal pain.   Objective: Vital signs in last 24 hours: Filed Vitals:   01/28/14 1551 01/28/14 2132 01/29/14 0540 01/29/14 1530  BP: 141/80 139/87 140/85 159/66  Pulse: 88 96 89   Temp: 97.7 F (36.5 C) 98.4 F (36.9 C) 98.4 F (36.9 C) 98.2 F (36.8 C)  TempSrc: Oral Oral Oral Oral  Resp: 18 18 18 16   Height: 5' 5"  (1.651 m)     Weight: 200 lb (90.719 kg)  183 lb 6.4 oz (83.19 kg)   SpO2: 100% 100% 100% 97%   Weight change:   Intake/Output Summary (Last 24 hours) at 01/29/14 1532 Last data filed at 01/29/14 1434  Gross per 24 hour  Intake 1805.83 ml  Output    253 ml  Net 1552.83 ml   General: Vital signs reviewed. NAD  Head: Normocephalic and atraumatic.  Eyes: conjunctivae normal, no scleral icterus. PERRLA  Neck: Supple, trachea midline, normal ROM, no JVD, masses, thyromegaly, or carotid bruit present.  Cardiovascular: RRR, S1 normal, S2 normal, no murmurs, gallops, or rubs.  Pulmonary/Chest: Clear to auscultation bilaterally, no wheezes, rales, or rhonchi.  Abdominal: Soft, non-tender, non-distended, BS +, no guarding present.  Musculoskeletal: No joint deformities, erythema, or stiffness, ROM full and nontender.  Extremities: No lower extremity edema in left lower extremity, 1+ edema in right lower extremity, pulses symmetric and intact bilaterally. No cyanosis or clubbing. Neurological: Awake and alert. Responding to questions. Cranial nerves: II: PERRL bilaterally, III,IV,VI: extraocular muscles unable to test - difficulty following complex commands, V: facial light touch sensation normal bilaterally, IX: soft palate elevation normal bilaterally, XI: sternocleidomastoid strength normal bilaterally, XII: tongue  strength normal  Sensory: normal tactile sense in upper and lower extremities  Motor: grade 4/5 R upper extremity  grade 5/5 L upper extremity  grade 5/5 in BL lower extremities Skin: Warm, dry and intact. No rashes or erythema. Psychiatric: Abnormal mood and affect  Lab Results: Basic Metabolic Panel:  Recent Labs Lab 01/29/14 0843 01/29/14 1310  NA 131* 127*  K 4.2 4.5  CL 96 93*  CO2 19 19  GLUCOSE 202* 423*  BUN 35* 35*  CREATININE 1.38* 1.37*  CALCIUM 8.8 8.3*   Liver Function Tests:  Recent Labs Lab 01/28/14 1130  AST 24  ALT 23  ALKPHOS 68  BILITOT 0.5  PROT 8.3  ALBUMIN 3.3*    Recent Labs Lab 01/28/14 1130 01/29/14 0843  LIPASE 164* 54    CBC:  Recent Labs Lab 01/28/14 1130  WBC 7.4  NEUTROABS 5.4  HGB 14.2  HCT 40.3  MCV 83.6  PLT 157   BNP:  Recent Labs Lab 01/28/14 1130  PROBNP 438.6   CBG:  Recent Labs Lab 01/28/14 1658 01/28/14 2129  GLUCAP 335* 250*   Hemoglobin A1C:  Recent Labs Lab 01/28/14 1918  HGBA1C 14.0*   Fasting Lipid Panel:  Recent Labs Lab 01/29/14 0100  CHOL 214*  HDL 33*  LDLCALC 160*  TRIG 103  CHOLHDL 6.5   Urinalysis:  Recent Labs Lab 01/29/14 0148  COLORURINE YELLOW  LABSPEC 1.024  PHURINE 5.0  GLUCOSEU >1000*  HGBUR SMALL*  BILIRUBINUR NEGATIVE  KETONESUR 15*  PROTEINUR NEGATIVE  UROBILINOGEN 0.2  NITRITE NEGATIVE  LEUKOCYTESUR LARGE*    Studies/Results: Dg Chest 2 View  01/28/2014   CLINICAL DATA:  Shortness of breath and weakness with history of diabetes and previous tobacco use. PA and lateral chest x-ray of May 27, 2010.  EXAM: CHEST  2 VIEW  COMPARISON:  PA and lateral chest of May 27, 2010  FINDINGS: The lungs are adequately inflated and clear. The heart and pulmonary vascularity are normal. The patient has undergone previous CABG. There are 6 intact sternal wires present. There is no pleural effusion. The bony thorax is unremarkable.  IMPRESSION: There is no  CHF nor other acute cardiopulmonary abnormality.   Electronically Signed   By: David  Martinique   On: 01/28/2014 11:05   Ct Head Wo Contrast  01/28/2014   CLINICAL DATA:  Decreased oral intake, numerous falls, history hypertension, diabetes, stroke  EXAM: CT HEAD WITHOUT CONTRAST  TECHNIQUE: Contiguous axial images were obtained from the base of the skull through the vertex without intravenous contrast.  COMPARISON:  07/25/2009  FINDINGS: Generalized atrophy.  Normal ventricular morphology.  No midline shift or mass effect.  Small vessel chronic ischemic changes of deep cerebral white matter.  Old LEFT basal ganglia and RIGHT thalamic lacunar infarcts.  Small cortical infarct high LEFT parietal region appear subacute to old in age though new since prior CT.  No intracranial hemorrhage, mass lesion, or additional acute infarction.  Benign-appearing BILATERAL basal ganglia calcifications.  No extra-axial fluid collections.  Visualized paranasal sinuses and mastoid air cells clear.  Bones unremarkable.  IMPRESSION: Small subacute to old high LEFT parietal cortical infarct new since 2011.  Atrophy with small vessel chronic ischemic changes of deep cerebral white matter.  Old lacunar infarcts LEFT basal ganglia and RIGHT thalamus.   Electronically Signed   By: Lavonia Dana M.D.   On: 01/28/2014 10:58   Mr Brain Wo Contrast  01/29/2014   CLINICAL DATA:  Decreased oral intake. History of falls. Hypertensive diabetic 80 year old male.  EXAM: MRI HEAD WITHOUT CONTRAST  TECHNIQUE: Multiplanar, multiecho pulse sequences of the brain and surrounding structures were obtained without intravenous contrast.  COMPARISON:  01/28/2014 head CT.  12/14/2005 brain MR.  FINDINGS: Small acute nonhemorrhagic infarcts involving the left aspect of the posterior body of the corpus callosum and left aspect of the splenium of the corpus callosum.  Remote infarcts basal ganglia and mid aspect of the left coronal radiata. Remote left parietal  lobe infarct with encephalomalacia. Remote small thalamic infarcts bilaterally. Remote right paracentral pontine infarct. Remote left cerebellar infarct.  Prominent small vessel disease type changes.  Global atrophy without hydrocephalus.  No intracranial mass lesion noted on this unenhanced exam.  Cervical spondylotic changes C4-5. Cervical medullary junction, pituitary region, pineal region and orbital structures unremarkable.  Major intracranial vascular structures are patent.  IMPRESSION: Small acute nonhemorrhagic infarcts involving the left aspect of the posterior body of the corpus callosum and left aspect of the splenium of the corpus callosum.  Remote infarcts and prominent small vessel disease type changes as detailed above.  Global atrophy without hydrocephalus.   Electronically Signed   By: Chauncey Cruel M.D.   On: 01/29/2014 00:25   Medications: I have reviewed the patient's current medications. Scheduled Meds: . antiseptic oral rinse  7 mL Mouth Rinse BID  . aspirin  325 mg Oral Daily  . enoxaparin (LOVENOX) injection  40 mg Subcutaneous Q24H  . insulin aspart  0-15 Units Subcutaneous TID WC  . insulin aspart  0-5 Units Subcutaneous QHS  . insulin glargine  10 Units Subcutaneous Daily  . insulin  starter kit- syringes  1 kit Other Once  . pantoprazole  40 mg Oral Q1200  . simvastatin  80 mg Oral q1800   Continuous Infusions: . sodium chloride 1,000 mL (01/29/14 1118)   PRN Meds:. Assessment/Plan: Principal Problem:   History of stroke Active Problems:   DIABETES MELLITUS, TYPE II   Dehydration   Hyponatremia   Dysphagia, unspecified(787.20)   Mild malnutrition   Weakness   Stroke  Failure to thrive/new onset dysarthria/dysphagia/weakness: CT head showed small subacute to old high left parietal cortical infarct new since 2011, atrophy with small vessel chronic ischemic changes of deep cerebral white matter and old lacunar infarcts in the left basal ganglia and right thalamus.  MRI brain with small acute nonhemorrhagic infarcts involving the left aspect of the posterior body of the corpus callosum and left aspect of the splenium of the corpus callosum. Neuro consulted and recommend stroke work up. SLP evaluated and recommend regular diet. PT/OT evaluated. PT recommends home health PT and noted that family is against SNF. OT does not require follow up. Fasting lipid panel with hyperlipidemia. Hgb A1c 14. -Simvastatin 80 mg PO initiated -Echo -Carotid dopplers -ASA 382m daily -EKG pending -consider low dose CT chest to look for malignancy given smoking history, may be done as outpatient  Cognitive decline: per family concerned that pt is depressed -MMSE -continue to monitor  RLE edema: unilateral LE edema. Pt is minimally ambulatory. -RLE venous duplex  Dehydration/Hyponatremia: Based on his age, weight, and Na+ level, the patient's free water deficit is calculated to be 4.2 L. His serum osmolality was calculated at 295. Serum osmolality 291, urine osmolality 628. Urinalysis showed glucose >1000, ketones 15, large leuks, negative protein and negative nitrites -NS at 75 mL/hr  -BMET Q6H  -Consistent carb diet  Hyperglycemia/DM2: Patient takes Metformin 500 mg po daily at home. Family states that he hasn't been taking medications regularly. Patient's last HgbA1c was 8.1 on 03/19/2010. Diabetes Coordinator consulted. Patient refusing to speak to coordinator. Hgb A1c 14. -Sliding scale insulin -CBG 4 times daily  -started lantus 10u daily  Elevated lipase: Patient's lipase level is elevated at 164. Repeat today 54.  HTN: Patient is on lisinopril 5 mg po daily at home, but has not been taking medications.  -Allow permissive hypertension  GERD: Patient is on protonix 40 mg at home.  -Continue protonix   HLD: Lipid panel on 10/29/2009 showed Cholesterol 160, TG 94, HDL 45, LDL 96. Repeat lipid panel on 01/30/2104 showed cholesterol 214, TG 103, HDL 33, LDL 160.    -Simvastatin 80 mg PO initiated  DVT/PE ppx: Lovenox 40 mg SQ daily  Dispo: Disposition is deferred at this time, awaiting improvement of current medical problems.  Anticipated discharge in approximately 1 day(s).   The patient does have a current PCP (Renato Shin MD) and [facility].  The patient does not have transportation limitations that hinder transportation to clinic appointments.  .Services Needed at time of discharge: Y = Yes, Blank = No PT: Home health PT  OT:   RN:   Equipment: Hospital bed, wheelchair  Other:     LOS: 1 day   JJacques Earthly MD 01/29/2014, 3:32 PM

## 2014-01-29 NOTE — Evaluation (Signed)
Clinical/Bedside Swallow Evaluation Patient Details  Name: Colin [name] MRN: [id number] Date of Birth: Sep 09, 1932  Today's Date: 01/29/2014 Time: 4098-1191 SLP Time Calculation (min): 24 min  Past Medical History:  Past Medical History  Diagnosis Date  . Diabetes mellitus without complication   . Hypertension   . Stroke   . Arthritis    Past Surgical History:  Past Surgical History  Procedure Laterality Date  . Cardiac surgery     HPI:  81 man with a PMH of DM2, CAD with CABGx4 in 1997, HTN, stroke, and arthritis admitted with concern about dehydration and declining health over the past 2 months. Per MD note, pt has had nothing to eat or drink over the past 2 days and unexplained weight loss of 10 pounds over 1-2 months.  MRI revealed Small acute nonhemorrhagic infarcts involving the left aspect of the posterior body of the corpus callosum and left aspect of the splenium of the corpus callosum. Remote infarcts and prominent small vessel disease type changes.  CXR There is no CHF nor other acute cardiopulmonary abnormality.    Assessment / Plan / Recommendation Clinical Impression  Family reports frequent throat clearing during meals the past month.  No overt indications of decreased airway; one mild throat clear.  Pt. taking large bites and continuous straw sips thin and needed max-total cues for smaller amounts including clinical rationale (pt. preoccupied with getting food and doesn't appear to attend to/retain info). Aspiration risk is higher due to impulsivity with po's, current CVA and history of coughing with po's prior to admission.  SLP recommeds regular texture diet and thin liquids, pt. prefers cups, pills whole in applesauce and continue ST to determine safety or may need MBS.      Aspiration Risk  Moderate    Diet Recommendation Regular;Thin liquid   Liquid Administration via: Cup Medication Administration: Whole meds with puree Supervision: Patient able to self  feed;Full supervision/cueing for compensatory strategies Compensations: Slow rate;Small sips/bites Postural Changes and/or Swallow Maneuvers: Seated upright 90 degrees    Other  Recommendations Oral Care Recommendations: Oral care BID   Follow Up Recommendations   (TBD)    Frequency and Duration min 2x/week  2 weeks   Pertinent Vitals/Pain WDL         Swallow Study          Oral/Motor/Sensory Function Overall Oral Motor/Sensory Function:  (will assess in tx, pt. angry about NPO status)   Ice Chips Ice chips: Not tested   Thin Liquid Thin Liquid: Impaired Presentation: Cup Pharyngeal  Phase Impairments: Suspected delayed Swallow    Nectar Thick Nectar Thick Liquid: Not tested   Honey Thick Honey Thick Liquid: Not tested   Puree Puree: Within functional limits   Solid   GO    Solid: Within functional limits       Royce Macadamia 01/29/2014,9:59 AM  Breck Coons Lonell Face.Ed [employer] [phone]

## 2014-01-29 NOTE — Clinical Social Work Note (Signed)
CSW was informed by PT that patient's family refuses for patient to DC to SNF under any circumstances.   Roddie Mc MSW, [place], [place], [phone]

## 2014-01-29 NOTE — Consult Note (Signed)
Referring Physician: Lubertha Basque    Chief Complaint: Stroke  HPI:                                                                                                                                         Colin [name] is an 80 y.o. male who at lives with daughter and son-in-law.  At baseline he is cared for by his daughter, who cooks, gets his medications ready and helps him arounds the house. She states over the last 2-4 months family has been calling wanting his assets and putting pressure on him to "die". This has frustrated both family and patient.  Prior to these events he was fairly happy go lucky. On a daily basis he usually would get in his scooter (has been scooter/wheelchair bound since 2013) and go to [place] where he would talk to customers coming and going.  Over the past 2 months he has slowly states he "just does not want to go or wake up, and refuses to take his medication".  At times stating he just wants to die". Over the past week he stopped eating and became weak needing more assistance from family for transfers and experienced a 10 lb weight loss. He was hospitalized For his recent weight loss and fatigue. On admission he was found to be dehydrated with a Sodium 127.   While hospitalized he is being hydrated with improvement of BUN and CR (current 35/1.38), he currently is eating and taking his medications and Glucose has been monitored. Currently per family he is much improved and returning to baseline.   Neurology was consulted due to dysphagia.  Small acute nonhemorrhagic infarcts involving the left aspect of the posterior body of the corpus callosum and left aspect of the splenium of the corpus callosum   Date last known well: Unable to determine Time last known well: Unable to determine tPA Given: No: out of window  Past Medical History  Diagnosis Date  . Diabetes mellitus without complication   . Hypertension   . Stroke   . Arthritis     Past Surgical History   Procedure Laterality Date  . Cardiac surgery      No family history on file. Social History:  reports that he has quit smoking. He does not have any smokeless tobacco history on file. He reports that he does not drink alcohol or use illicit drugs.  Allergies:  Allergies  Allergen Reactions  . Ramipril     REACTION: Cough    Medications:  Prior to Admission:  Prescriptions prior to admission  Medication Sig Dispense Refill  . lisinopril (PRINIVIL,ZESTRIL) 5 MG tablet Take 5 mg by mouth daily.      Marland Kitchen lovastatin (MEVACOR) 20 MG tablet Take 20 mg by mouth daily.      . metFORMIN (GLUCOPHAGE) 500 MG tablet Take 500 mg by mouth daily.      Marland Kitchen omeprazole (PRILOSEC) 20 MG capsule Take 20 mg by mouth daily.       Scheduled: . aspirin  300 mg Rectal Daily  . enoxaparin (LOVENOX) injection  40 mg Subcutaneous Q24H  . insulin aspart  0-9 Units Subcutaneous TID WC  . pantoprazole (PROTONIX) IV  40 mg Intravenous Q24H    ROS:                                                                                                                                       History obtained from daughter  General ROS: positive for - weight loss Psychological ROS: negative for - behavioral disorder, hallucinations, memory difficulties, mood swings or suicidal ideation Ophthalmic ROS: negative for - blurry vision, double vision, eye pain or loss of vision ENT ROS: negative for - epistaxis, nasal discharge, oral lesions, sore throat, tinnitus or vertigo Allergy and Immunology ROS: negative for - hives or itchy/watery eyes Hematological and Lymphatic ROS: negative for - bleeding problems, bruising or swollen lymph nodes Endocrine ROS: negative for - galactorrhea, hair pattern changes, polydipsia/polyuria or temperature intolerance Respiratory ROS: negative for - cough, hemoptysis, shortness  of breath or wheezing Cardiovascular ROS: negative for - chest pain, dyspnea on exertion, edema or irregular heartbeat Gastrointestinal ROS: negative for - abdominal pain, diarrhea, hematemesis, nausea/vomiting or stool incontinence Genito-Urinary ROS: negative for - dysuria, hematuria, incontinence or urinary frequency/urgency Musculoskeletal ROS: negative for - joint swelling or muscular weakness Neurological ROS: as noted in HPI Dermatological ROS: negative for rash and skin lesion changes  Neurologic Examination:                                                                                                      Blood pressure 140/85, pulse 89, temperature 98.4 F (36.9 C), temperature source Oral, resp. rate 18, height 5\' 5"  (1.651 m), weight 83.19 kg (183 lb 6.4 oz), SpO2 100.00%.  General: NAD Mental Status: Alert, oriented to hospital only--he at baseline would not know date, month or year per daughter.  patient is minimally vocal and only answers one word question--daughter states he is unhappy  about not getting food on time but talks once he gets his food.   Able to follow simple commands such as squeezing hands, touching his nose, moving extremities when asked.  Cranial Nerves: II: Discs flat bilaterally; Visual fields grossly normal, pupils equal, round, reactive to light and accommodation III,IV, VI: ptosis not present, extra-ocular motions intact bilaterally V,VII: smile symmetric, facial light touch sensation normal bilaterally VIII: hearing normal bilaterally IX,X: gag reflex present XI: bilateral shoulder shrug XII: midline tongue extension  Motor: Right : Upper extremity   5/5    Left:     Upper extremity   5/5  --at baseline he has significant bilateral proximal weakness and is wheelchair bound.  On exam he shows 3/5 proximally and 4/5 knee flexion dn extension and 5/5 bilateral DF/PF Tone and bulk:normal tone throughout; no atrophy noted Sensory: Pinprick and light  touch intact throughout, bilaterally Deep Tendon Reflexes:  Right: Upper Extremity   Left: Upper extremity   biceps (C-5 to C-6) 2/4   biceps (C-5 to C-6) 2/4 tricep (C7) 2/4    triceps (C7) 2/4 Brachioradialis (C6) 2/4  Brachioradialis (C6) 2/4  Lower Extremity Lower Extremity  quadriceps (L-2 to L-4) 0/4   quadriceps (L-2 to L-4) 0/4 Achilles (S1) 0/4   Achilles (S1) 0/4  Plantars: Right: downgoing   Left: downgoing Cerebellar: normal finger-to-nose,  Gait: not tested CV: pulses palpable throughout     Lab Results: Basic Metabolic Panel:  Recent Labs Lab 01/28/14 1130 01/28/14 1918 01/29/14 0100 01/29/14 0843  NA 127* 127* 131* 131*  K 5.3 4.5 4.1 4.2  CL 89* 89* 94* 96  CO2 GLUCOSE 421* 309* 211* 202*  BUN 48* 44* 38* 35*  CREATININE 1.54* 1.46* 1.38* 1.38*  CALCIUM 9.8 9.0 9.3 8.8    Liver Function Tests:  Recent Labs Lab 01/28/14 1130  AST 24  ALT 23  ALKPHOS 68  BILITOT 0.5  PROT 8.3  ALBUMIN 3.3*    Recent Labs Lab 01/28/14 1130 01/29/14 0843  LIPASE 164* 54   No results found for this basename: AMMONIA,  in the last 168 hours  CBC:  Recent Labs Lab 01/28/14 1130  WBC 7.4  NEUTROABS 5.4  HGB 14.2  HCT 40.3  MCV 83.6  PLT 157    Cardiac Enzymes: No results found for this basename: CKTOTAL, CKMB, CKMBINDEX, TROPONINI,  in the last 168 hours  Lipid Panel:  Recent Labs Lab 01/29/14 0100  CHOL 214*  TRIG 103  HDL 33*  CHOLHDL 6.5  VLDL 21  LDLCALC 161*    CBG:  Recent Labs Lab 01/28/14 1658 01/28/14 2129  GLUCAP 335* 250*    Microbiology: Results for orders placed during the hospital encounter of 08/11/09  URINE CULTURE     Status: None   Collection Time    08/11/09  9:04 AM      Result Value Ref Range Status   Specimen Description URINE, CLEAN CATCH   Final   Special Requests NONE   Final   Colony Count NO GROWTH   Final   Culture NO GROWTH   Final   Report Status 08/12/2009 FINAL   Final     Coagulation Studies: No results found for this basename: LABPROT, INR,  in the last 72 hours  Imaging: Dg Chest 2 View  01/28/2014   CLINICAL DATA:  Shortness of breath and weakness with history of diabetes and previous tobacco use. PA and lateral chest x-ray of May 27, 2010.  EXAM: CHEST  2 VIEW  COMPARISON:  PA and lateral chest of May 27, 2010  FINDINGS: The lungs are adequately inflated and clear. The heart and pulmonary vascularity are normal. The patient has undergone previous CABG. There are 6 intact sternal wires present. There is no pleural effusion. The bony thorax is unremarkable.  IMPRESSION: There is no CHF nor other acute cardiopulmonary abnormality.   Electronically Signed   By: David  [place]   On: 01/28/2014 11:05   Ct Head Wo Contrast  01/28/2014   CLINICAL DATA:  Decreased oral intake, numerous falls, history hypertension, diabetes, stroke  EXAM: CT HEAD WITHOUT CONTRAST  TECHNIQUE: Contiguous axial images were obtained from the base of the skull through the vertex without intravenous contrast.  COMPARISON:  07/25/2009  FINDINGS: Generalized atrophy.  Normal ventricular morphology.  No midline shift or mass effect.  Small vessel chronic ischemic changes of deep cerebral white matter.  Old LEFT basal ganglia and RIGHT thalamic lacunar infarcts.  Small cortical infarct high LEFT parietal region appear subacute to old in age though new since prior CT.  No intracranial hemorrhage, mass lesion, or additional acute infarction.  Benign-appearing BILATERAL basal ganglia calcifications.  No extra-axial fluid collections.  Visualized paranasal sinuses and mastoid air cells clear.  Bones unremarkable.  IMPRESSION: Small subacute to old high LEFT parietal cortical infarct new since 2011.  Atrophy with small vessel chronic ischemic changes of deep cerebral white matter.  Old lacunar infarcts LEFT basal ganglia and RIGHT thalamus.   Electronically Signed   By: Ulyses Southward M.D.   On:  01/28/2014 10:58   Mr Brain Wo Contrast  01/29/2014   CLINICAL DATA:  Decreased oral intake. History of falls. Hypertensive diabetic 80 year old male.  EXAM: MRI HEAD WITHOUT CONTRAST  TECHNIQUE: Multiplanar, multiecho pulse sequences of the brain and surrounding structures were obtained without intravenous contrast.  COMPARISON:  01/28/2014 head CT.  12/14/2005 brain MR.  FINDINGS: Small acute nonhemorrhagic infarcts involving the left aspect of the posterior body of the corpus callosum and left aspect of the splenium of the corpus callosum.  Remote infarcts basal ganglia and mid aspect of the left coronal radiata. Remote left parietal lobe infarct with encephalomalacia. Remote small thalamic infarcts bilaterally. Remote right paracentral pontine infarct. Remote left cerebellar infarct.  Prominent small vessel disease type changes.  Global atrophy without hydrocephalus.  No intracranial mass lesion noted on this unenhanced exam.  Cervical spondylotic changes C4-5. Cervical medullary junction, pituitary region, pineal region and orbital structures unremarkable.  Major intracranial vascular structures are patent.  IMPRESSION: Small acute nonhemorrhagic infarcts involving the left aspect of the posterior body of the corpus callosum and left aspect of the splenium of the corpus callosum.  Remote infarcts and prominent small vessel disease type changes as detailed above.  Global atrophy without hydrocephalus.   Electronically Signed   By: Bridgett Larsson M.D.   On: 01/29/2014 00:25    Felicie Morn PA-C [facility] [phone]  01/29/2014, 10:06 AM  Patient seen and examined.  Clinical course and management discussed.  Necessary edits performed.  I agree with the above.  Assessment and plan of care developed and discussed below.     Assessment: 80 y.o. male with a 2 month progressive decline in strength and motivation to eat or take part in external events.  Due to weakness and FTT he was brought to  hospital on admission found to be dehydrated with elevated BG. CT reviewed and shows a small subacute to  old high left parietal cortical infarct new since 2011. MRI performed in follow up and reviewed as well and shows a small acute nonhemorrhagic infarcts involving the left aspect of the posterior body of the corpus callosum and left aspect of the splenium of the corpus callosum. With hydration and nutrition patient has improved significantly. Exam at this time shows no acute localizing or lateralizing symptoms.  He continues to have bilateral LE weakness which is old.   Stroke Risk Factors - diabetes mellitus and hypertension  1. HgbA1c, fasting lipid panel 2. PT consult, OT consult, Speech consult 3. Echocardiogram 4. Carotid dopplers 5. Prophylactic therapy-Antiplatelet med: Aspirin - dose  daily 7. Risk factor modification 8. Telemetry monitoring 9. Frequent neuro checks  Thana Farr, MD [facility] [phone]  01/29/2014  12:18 PM

## 2014-01-30 DIAGNOSIS — I82409 Acute embolism and thrombosis of unspecified deep veins of unspecified lower extremity: Secondary | ICD-10-CM

## 2014-01-30 DIAGNOSIS — R079 Chest pain, unspecified: Secondary | ICD-10-CM

## 2014-01-30 DIAGNOSIS — Z8673 Personal history of transient ischemic attack (TIA), and cerebral infarction without residual deficits: Secondary | ICD-10-CM

## 2014-01-30 DIAGNOSIS — R0602 Shortness of breath: Secondary | ICD-10-CM

## 2014-01-30 DIAGNOSIS — I825Y9 Chronic embolism and thrombosis of unspecified deep veins of unspecified proximal lower extremity: Secondary | ICD-10-CM

## 2014-01-30 DIAGNOSIS — R609 Edema, unspecified: Secondary | ICD-10-CM

## 2014-01-30 DIAGNOSIS — M79609 Pain in unspecified limb: Secondary | ICD-10-CM

## 2014-01-30 LAB — BASIC METABOLIC PANEL
ANION GAP: 11 (ref 5–15)
ANION GAP: 12 (ref 5–15)
BUN: 36 mg/dL — ABNORMAL HIGH (ref 6–23)
BUN: 31 mg/dL — AB (ref 6–23)
CALCIUM: 8.5 mg/dL (ref 8.4–10.5)
CO2: 22 mEq/L (ref 19–32)
CO2: 21 meq/L (ref 19–32)
CREATININE: 1.46 mg/dL — AB (ref 0.50–1.35)
Calcium: 8.5 mg/dL (ref 8.4–10.5)
Chloride: 96 mEq/L (ref 96–112)
Chloride: 98 mEq/L (ref 96–112)
Creatinine, Ser: 1.33 mg/dL (ref 0.50–1.35)
GFR calc Af Amer: 57 mL/min — ABNORMAL LOW (ref >90)
GFR calc non Af Amer: 44 mL/min — ABNORMAL LOW (ref >90)
GFR calc non Af Amer: 49 mL/min — ABNORMAL LOW (ref >90)
GFR, EST AFRICAN AMERICAN: 51 mL/min — AB (ref >90)
GLUCOSE: 226 mg/dL — AB (ref 70–99)
Glucose, Bld: 291 mg/dL — ABNORMAL HIGH (ref 70–99)
POTASSIUM: 4.6 meq/L (ref 3.7–5.3)
Potassium: 4.8 mEq/L (ref 3.7–5.3)
SODIUM: 128 meq/L — AB (ref 137–147)
Sodium: 132 mEq/L — ABNORMAL LOW (ref 137–147)

## 2014-01-30 LAB — GLUCOSE, CAPILLARY
Glucose-Capillary: 196 mg/dL — ABNORMAL HIGH (ref 70–99)
Glucose-Capillary: 315 mg/dL — ABNORMAL HIGH (ref 70–99)
Glucose-Capillary: 131 mg/dL — ABNORMAL HIGH (ref 70–99)
Glucose-Capillary: 137 mg/dL — ABNORMAL HIGH (ref 70–99)

## 2014-01-30 LAB — CBC
HEMATOCRIT: 33.9 % — AB (ref 39.0–52.0)
HEMOGLOBIN: 11.7 g/dL — AB (ref 13.0–17.0)
MCH: 29.1 pg (ref 26.0–34.0)
MCHC: 34.5 g/dL (ref 30.0–36.0)
MCV: 84.3 fL (ref 78.0–100.0)
Platelets: 157 10*3/uL (ref 150–400)
RBC: 4.02 MIL/uL — ABNORMAL LOW (ref 4.22–5.81)
RDW: 12.7 % (ref 11.5–15.5)
WBC: 4.5 10*3/uL (ref 4.0–10.5)

## 2014-01-30 LAB — PROTIME-INR
INR: 1.13 (ref 0.00–1.49)
Prothrombin Time: 14.5 seconds (ref 11.6–15.2)

## 2014-01-30 LAB — APTT: APTT: 32 s (ref 24–37)

## 2014-01-30 MED ORDER — INSULIN GLARGINE 100 UNIT/ML ~~LOC~~ SOLN
15.0000 [IU] | Freq: Every day | SUBCUTANEOUS | Status: DC
  Administered 2014-01-31: 15 [IU] via SUBCUTANEOUS
  Filled 2014-01-30: qty 0.15

## 2014-01-30 MED ORDER — HEPARIN BOLUS VIA INFUSION
4000.0000 [IU] | Freq: Once | INTRAVENOUS | Status: AC
  Administered 2014-01-30: 4000 [IU] via INTRAVENOUS
  Filled 2014-01-30 (×2): qty 4000

## 2014-01-30 MED ORDER — SIMVASTATIN 40 MG PO TABS
40.0000 mg | ORAL_TABLET | Freq: Every day | ORAL | Status: DC
Start: 2014-01-30 — End: 2014-01-30
  Filled 2014-01-30: qty 1

## 2014-01-30 MED ORDER — HEPARIN (PORCINE) IN NACL 100-0.45 UNIT/ML-% IJ SOLN
1300.0000 [IU]/h | INTRAMUSCULAR | Status: DC
  Administered 2014-01-30: 1300 [IU]/h via INTRAVENOUS
  Filled 2014-01-30 (×3): qty 250

## 2014-01-30 MED ORDER — ATORVASTATIN CALCIUM 40 MG PO TABS
40.0000 mg | ORAL_TABLET | Freq: Every day | ORAL | Status: DC
  Administered 2014-01-30 – 2014-01-31 (×2): 40 mg via ORAL
  Filled 2014-01-30 (×2): qty 1

## 2014-01-30 NOTE — Progress Notes (Signed)
STROKE TEAM PROGRESS NOTE   HISTORY Colin [name] is an 80 y.o. male who at lives with daughter and son-in-law. At baseline he is cared for by his daughter, who cooks, gets his medications ready and helps him arounds the house. She states over the last 2-4 months family has been calling wanting his assets and putting pressure on him to "die". This has frustrated both family and patient. Prior to these events he was fairly happy go lucky. On a daily basis he usually would get in his scooter (has been scooter/wheelchair bound since 2013) and go to [place] where he would talk to customers coming and going. Over the past 2 months he has slowly states he "just does not want to go or wake up, and refuses to take his medication". At times stating he just wants to die". Over the past week he stopped eating and became weak needing more assistance from family for transfers and experienced a 10 lb weight loss. He was hospitalized For his recent weight loss and fatigue. On admission he was found to be dehydrated with a Sodium 127.   While hospitalized he is being hydrated with improvement of BUN and CR (current 35/1.38), he currently is eating and taking his medications and Glucose has been monitored. Currently per family he is much improved and returning to baseline.   Neurology was consulted due to dysphagia. Small acute nonhemorrhagic infarcts involving the left aspect of the posterior body of the corpus callosum and left aspect of the splenium of the corpus callosum Last known well unable to determine. Patient was not administered TPA secondary to delay in arrival.    SUBJECTIVE (INTERVAL HISTORY) No family is at the bedside.  Overall he feels his condition is unchanged. He is sleepy and also depressed. Do not want to answer any questions and do not cooperate with exam at all. History was obtained from chart.  OBJECTIVE  Recent Labs Lab 01/28/14 2129 01/29/14 1644 01/29/14 2134 01/30/14 0818  01/30/14 1135  GLUCAP 250* 363* 230* 196* 315*    Recent Labs Lab 01/29/14 0843 01/29/14 1310 01/29/14 1812 01/30/14 0046 01/30/14 0559  NA 131* 127* 127* 128* 132*  K 4.2 4.5 4.1 4.8 4.6  CL 96 93* 93* 96 98  CO2 GLUCOSE 202* 423* 333* 291* 226*  BUN 35* 35* 34* 36* 31*  CREATININE 1.38* 1.37* 1.50* 1.46* 1.33  CALCIUM 8.8 8.3* 8.4 8.5 8.5    Recent Labs Lab 01/28/14 1130  AST 24  ALT 23  ALKPHOS 68  BILITOT 0.5  PROT 8.3  ALBUMIN 3.3*    Recent Labs Lab 01/28/14 1130  WBC 7.4  NEUTROABS 5.4  HGB 14.2  HCT 40.3  MCV 83.6  PLT 157   No results found for this basename: CKTOTAL, CKMB, CKMBINDEX, TROPONINI,  in the last 168 hours No results found for this basename: LABPROT, INR,  in the last 72 hours  Recent Labs  01/29/14 0148  COLORURINE YELLOW  LABSPEC 1.024  PHURINE 5.0  GLUCOSEU >1000*  HGBUR SMALL*  BILIRUBINUR NEGATIVE  KETONESUR 15*  PROTEINUR NEGATIVE  UROBILINOGEN 0.2  NITRITE NEGATIVE  LEUKOCYTESUR LARGE*       Component Value Date/Time   CHOL 214* 01/29/2014 0100   TRIG 103 01/29/2014 0100   HDL 33* 01/29/2014 0100   CHOLHDL 6.5 01/29/2014 0100   VLDL 21 01/29/2014 0100   LDLCALC 160* 01/29/2014 0100   Lab Results  Component Value Date   HGBA1C  14.0* 01/28/2014   No results found for this basename: labopia,  cocainscrnur,  labbenz,  amphetmu,  thcu,  labbarb    No results found for this basename: ETH,  in the last 168 hours  Dg Chest 2 View 01/28/2014   There is no CHF nor other acute cardiopulmonary abnormality.     Ct Head Wo Contrast 01/28/2014    Small subacute to old high LEFT parietal cortical infarct new since 2011.  Atrophy with small vessel chronic ischemic changes of deep cerebral white matter.  Old lacunar infarcts LEFT basal ganglia and RIGHT thalamus.     Mr Brain Wo Contrast 01/29/2014   Small acute nonhemorrhagic infarcts involving the left aspect of the posterior body of the corpus callosum and left aspect of  the splenium of the corpus callosum.  Remote infarcts and prominent small vessel disease type changes as detailed above.  Global atrophy without hydrocephalus.     LE Venous Doppler  - Findings consistent with acute deep vein thrombosis involving the right saphenofemoral junction, common femoral vein, profunda vein, femoral vein, and popliteal vein. - Deep vein thrombosis involving the left posterior tibial vein. - Findings consistent with acute deep vein thrombosis involving the left lower extremity. - No evidence of Baker&'s cyst on the right or left.  2D Echocardiogram  EF 55-60% with no source of embolus.   Carotid Doppler  No evidence of hemodynamically significant internal carotid artery stenosis. Vertebral artery flow is antegrade.    PHYSICAL EXAM  Temp:  [98.3 F (36.8 C)-98.5 F (36.9 C)] 98.4 F (36.9 C) (09/02 2156) Pulse Rate:  [46-80] 46 (09/02 2156) Resp:  [16] 16 (09/02 2156) BP: (143-148)/(57-88) 148/88 mmHg (09/02 2156) SpO2:  [98 %-100 %] 100 % (09/02 2156) Weight:  [185 lb 3 oz (84 kg)] 185 lb 3 oz (84 kg) (09/02 0521)  General - Well nourished, well developed, in no apparent distress, sleepy, do not cooperative on exam.  Ophthalmologic - not able to exam.  Cardiovascular - Regular rate and rhythm with no murmur.  Neuro - pt was sleepy and depressed, not cooperative on exam at all. arousable on voice stimulation but fall back to sleep without constant stimulation. Able to speak short sentences but refused to answer orientation questions. Facial symmetrical, eyes in middle, PERRL, blink to visual threat bilaterally. Spontaneously move all extremites. Reflex 1+ throughout. No babinski sign.   ASSESSMENT/PLAN Colin [name] is a 80 y.o. male with history of DM2, CAD with CABGx4 in 1997, HTN, stroke, and arthritis  presenting with dehydration and declining health. Imaging confirms incidental left corpus callosum infarcts. Pt found to have extensive right LE  DVT. Currently on heparin drip. His stroke could be due to small vessel disease as his uncontrolled DM and HLD, but also could be paradoxical emboli from DVT provided he has PFO. No further work up as this will not change currently management.  Stroke:  Incidental left corpus callosum infarcts in setting of old strokes, thrombotic vs embolic secondary to DVT     no antithrombotics prior to admission, now on aspirin 325 mg orally every day and heparin  MRA  Will not needed as it will not alter treatment  Carotid Doppler  No significant stenosis  2D Echo  No source of embolus   On heparin drip but no level yet. Will recommend stop ASA once heparin level therapeutic.   Carb Control thin liquids.   Up with assistance  Aggressive risk factor control, like DM, HTN, etc  Please continue to treat DVT as per primary team  LE VTE  On IV heparin drip  Need to follow up with heparin level every 6 hours  Goal 0.3-0.5 for therapeutic lovenox injection.   Once haperin therapeutic, d/c ASA  Hypertension   Home meds:  lisinopril. Not resumed in hospital  BP 143-159/57-66 past 24h  Stable  Patient counseled to be compliant with his blood pressure medications  Hyperlipidemia  Home meds:  mevacor 20. Now on lipitor 40   LDL 160   LDL goal < 100 (<16 for diabetics)  Diabetes  Home meds:  Glucophage, now on Insulin (lantus)  HgbA1c 14.0   Uncontrolled  Goal < 7.0  educated patient about lifestyle changes for diabetes treatment prevention  Other Stroke Risk Factors Advanced age   Obesity, Body mass index is 30.82 kg/(m^2).    Hx stroke/TIA  Other Active Problems  Memory loss  depression  Dehydration and hyponatremia  Elevated lipase  GERD  Other Pertinent History  Old bilateral LE weakness  Hospital day # 2  Neurology will sign off. Please call with further questions.  Marvel Plan, MD PhD Stroke Neurology 01/31/2014 1:10 AM      To contact Stroke  Continuity provider, please refer to [email or web address]. After hours, contact General Neurology
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
## 2014-01-30 NOTE — Progress Notes (Signed)
Note/chart reviewed.  Katie Tangelia Sanson, RD, LDN Pager #: [phone] After-Hours Pager #: [phone]  

## 2014-01-30 NOTE — Progress Notes (Signed)
SLP Cancellation Note  Patient Details Name: Colin [name] MRN: [id number] DOB: 06/11/1932   Cancelled treatment:        Attempted to see this am and pt. Out for procedure.  Returned and pt. Speaking with MD.  Will continue efforts    Royce Macadamia 01/30/2014, 3:37 PM

## 2014-01-30 NOTE — Progress Notes (Addendum)
Subjective: No acute events overnight. He reports he feels worse today. He has chest pain but no shortness of breath. He refused to answer any other questions after this.  I discussed with the patient's son-in-law goals of care. He is happy with the care his father-in-law has received so far. I emphasized the importance of having his father-in-law be compliant with treatment/medications. The patient's son-in-law understands and is agreeable.  Objective: Vital signs in last 24 hours: Filed Vitals:   01/29/14 0540 01/29/14 1530 01/29/14 2135 01/30/14 0521  BP: 140/85 159/66 145/58 143/57  Pulse: 89 80 71 80  Temp: 98.4 F (36.9 C) 98.2 F (36.8 C) 98.2 F (36.8 C) 98.3 F (36.8 C)  TempSrc: Oral Oral Oral Oral  Resp: Height:      Weight: 183 lb 6.4 oz (83.19 kg)   185 lb 3 oz (84 kg)  SpO2: 100% 97% 100% 98%   Weight change: -14 lb 13 oz (-6.719 kg)  Intake/Output Summary (Last 24 hours) at 01/30/14 1604 Last data filed at 01/30/14 0900  Gross per 24 hour  Intake    120 ml  Output      0 ml  Net    120 ml   General: Vital signs reviewed. NAD  Head: Normocephalic and atraumatic.  Eyes: conjunctivae normal, no scleral icterus.  Neck: Supple, trachea midline. Musculoskeletal: No joint deformities, erythema, or stiffness, ROM full and nontender.  Extremities: No lower extremity edema in left lower extremity, 1+ edema in right lower extremity, pulses symmetric and intact bilaterally. No cyanosis or clubbing. Neurological: Answers some questions. Skin: Warm, dry and intact. No rashes or erythema. Psychiatric: Abnormal mood and affect  Patient refused rest of exam  Lab Results: Basic Metabolic Panel:  Recent Labs Lab 01/30/14 0046 01/30/14 0559  NA 128* 132*  K 4.8 4.6  CL 96 98  CO2 21 22  GLUCOSE 291* 226*  BUN 36* 31*  CREATININE 1.46* 1.33  CALCIUM 8.5 8.5   Liver Function Tests:  Recent Labs Lab 01/28/14 1130  AST 24  ALT 23  ALKPHOS 68    BILITOT 0.5  PROT 8.3  ALBUMIN 3.3*    Recent Labs Lab 01/28/14 1130 01/29/14 0843  LIPASE 164* 54    CBC:  Recent Labs Lab 01/28/14 1130  WBC 7.4  NEUTROABS 5.4  HGB 14.2  HCT 40.3  MCV 83.6  PLT 157   BNP:  Recent Labs Lab 01/28/14 1130  PROBNP 438.6   CBG:  Recent Labs Lab 01/28/14 1658 01/28/14 2129 01/29/14 1644 01/29/14 2134 01/30/14 0818 01/30/14 1135  GLUCAP 335* 250* 363* 230* 196* 315*   Hemoglobin A1C:  Recent Labs Lab 01/28/14 1918  HGBA1C 14.0*   Fasting Lipid Panel:  Recent Labs Lab 01/29/14 0100  CHOL 214*  HDL 33*  LDLCALC 160*  TRIG 103  CHOLHDL 6.5   Urinalysis:  Recent Labs Lab 01/29/14 0148  COLORURINE YELLOW  LABSPEC 1.024  PHURINE 5.0  GLUCOSEU >1000*  HGBUR SMALL*  BILIRUBINUR NEGATIVE  KETONESUR 15*  PROTEINUR NEGATIVE  UROBILINOGEN 0.2  NITRITE NEGATIVE  LEUKOCYTESUR LARGE*    Studies/Results: Mr Brain Wo Contrast  01/29/2014   CLINICAL DATA:  Decreased oral intake. History of falls. Hypertensive diabetic 80 year old male.  EXAM: MRI HEAD WITHOUT CONTRAST  TECHNIQUE: Multiplanar, multiecho pulse sequences of the brain and surrounding structures were obtained without intravenous contrast.  COMPARISON:  01/28/2014 head CT.  12/14/2005 brain MR.  FINDINGS: Small acute nonhemorrhagic  infarcts involving the left aspect of the posterior body of the corpus callosum and left aspect of the splenium of the corpus callosum.  Remote infarcts basal ganglia and mid aspect of the left coronal radiata. Remote left parietal lobe infarct with encephalomalacia. Remote small thalamic infarcts bilaterally. Remote right paracentral pontine infarct. Remote left cerebellar infarct.  Prominent small vessel disease type changes.  Global atrophy without hydrocephalus.  No intracranial mass lesion noted on this unenhanced exam.  Cervical spondylotic changes C4-5. Cervical medullary junction, pituitary region, pineal region and orbital  structures unremarkable.  Major intracranial vascular structures are patent.  IMPRESSION: Small acute nonhemorrhagic infarcts involving the left aspect of the posterior body of the corpus callosum and left aspect of the splenium of the corpus callosum.  Remote infarcts and prominent small vessel disease type changes as detailed above.  Global atrophy without hydrocephalus.   Electronically Signed   By: Bridgett Larsson M.D.   On: 01/29/2014 00:25   Medications: I have reviewed the patient's current medications. Scheduled Meds: . antiseptic oral rinse  7 mL Mouth Rinse BID  . aspirin  325 mg Oral Daily  . atorvastatin  40 mg Oral q1800  . heparin  4,000 Units Intravenous Once  . insulin aspart  0-15 Units Subcutaneous TID WC  . insulin aspart  0-5 Units Subcutaneous QHS  . [START ON 01/31/2014] insulin glargine  15 Units Subcutaneous Daily  . pantoprazole  40 mg Oral Q1200   Continuous Infusions: . heparin     PRN Meds:. Assessment/Plan: Principal Problem:   History of stroke Active Problems:   DIABETES MELLITUS, TYPE II   Dehydration   Hyponatremia   Dysphagia, unspecified(787.20)   Mild malnutrition   Weakness   Stroke  Failure to thrive/new onset dysarthria/dysphagia/weakness: CT head showed small subacute to old high left parietal cortical infarct new since 2011, atrophy with small vessel chronic ischemic changes of deep cerebral white matter and old lacunar infarcts in the left basal ganglia and right thalamus. MRI brain with small acute nonhemorrhagic infarcts involving the left aspect of the posterior body of the corpus callosum and left aspect of the splenium of the corpus callosum. Neuro consulted and recommend stroke work up. SLP evaluated and recommend regular diet. PT/OT evaluated. PT recommends home health and noted that family is against SNF. Family is also not interested in home health. OT does not require follow up. Fasting lipid panel with hyperlipidemia. Hgb A1c 14. Echo with  LV EF 55-60%. -Atorvastatin  daily -Carotid dopplers -ASA  daily -consider low dose CT chest to look for malignancy given smoking history, may be done as outpatient  Cognitive decline: per family concerned that pt is depressed. MMSE attempted with the patient. Patient was only able to answer the orientation questions before becoming too tired/sleepy/uncooperative. He only got 2/10 questions correct, indicating significant cognitive decline.  -continue to monitor  RLE edema: unilateral LE edema. Pt is minimally ambulatory. Venous duplex demonstrating acute deep vein thrombosis involving the right saphenofemoral junction, common femoral vein, profunda vein, femoral vein, and popliteal vein.Deep vein thrombosis involving the left posterior tibial vein. -Heparin gtt -Vascular surgery consult  Dehydration/Hyponatremia: Based on his age, weight, and Na+ level, the patient's free water deficit is calculated to be 4.2 L. His serum osmolality was calculated at 295. Serum osmolality 291, urine osmolality 628. Urinalysis showed glucose >1000, ketones 15, large leuks, negative protein and negative nitrites. Resolving. -NS at 75 mL/hr  -Consistent carb diet  Hyperglycemia/DM2: Patient takes Metformin 500  mg po daily at home. Family states that he hasn't been taking medications regularly. Patient's last HgbA1c was 8.1 on 03/19/2010. Hgb A1c 14. -Sliding scale insulin -CBG 4 times daily  -started lantus 15u daily  HTN: Patient is on lisinopril 5 mg po daily at home, but has not been taking medications.  -Allow permissive hypertension  GERD: Patient is on protonix 40 mg at home.  -Continue protonix   HLD: Lipid panel on 10/29/2009 showed Cholesterol 160, TG 94, HDL 45, LDL 96. Repeat lipid panel on 01/30/2104 showed cholesterol 214, TG 103, HDL 33, LDL 160.  -Atorvastatin  daily  DVT/PE ppx: Lovenox 40 mg SQ daily  Dispo: Disposition is deferred at this time, awaiting improvement of current  medical problems.  Anticipated discharge in approximately 1 day(s).   The patient does have a current PCP Romero Belling, MD) and does not need an [facility] hospital follow-up appointment after discharge.  The patient does not have transportation limitations that hinder transportation to clinic appointments.  .Services Needed at time of discharge: Y = Yes, Blank = No PT: Home health PT  OT:   RN:   Equipment: Hospital bed, wheelchair  Other:     LOS: 2 days   Griffin Basil, MD 01/30/2014, 4:04 PM

## 2014-01-30 NOTE — Progress Notes (Addendum)
ANTICOAGULATION CONSULT NOTE - Initial Consult  Pharmacy Consult for Heparin Indication: DVT bilateral LLE  Allergies  Allergen Reactions  . Ramipril     REACTION: Cough    Patient Measurements: Height: 5' 5"  (165.1 cm) Weight: 185 lb 3 oz (84 kg) IBW/kg (Calculated) : 61.5 Heparin Dosing Weight: 78.9 kg  Vital Signs: Temp: 98.3 F (36.8 C) (09/02 0521) Temp src: Oral (09/02 0521) BP: 143/57 mmHg (09/02 0521) Pulse Rate: 80 (09/02 0521)  Labs:  Recent Labs  01/28/14 1130  01/29/14 1812 01/30/14 0046 01/30/14 0559  HGB 14.2  --   --   --   --   HCT 40.3  --   --   --   --   PLT 157  --   --   --   --   CREATININE 1.54*  < > 1.50* 1.46* 1.33  < > = values in this interval not displayed.  Estimated Creatinine Clearance: 44.9 ml/min (by C-G formula based on Cr of 1.33).   Medical History: Past Medical History  Diagnosis Date  . Diabetes mellitus without complication   . Hypertension   . Stroke   . Arthritis     Medications:  Prescriptions prior to admission  Medication Sig Dispense Refill  . lisinopril (PRINIVIL,ZESTRIL) 5 MG tablet Take 5 mg by mouth daily.      Marland Kitchen lovastatin (MEVACOR) 20 MG tablet Take 20 mg by mouth daily.      . metFORMIN (GLUCOPHAGE) 500 MG tablet Take 500 mg by mouth daily.      Marland Kitchen omeprazole (PRILOSEC) 20 MG capsule Take 20 mg by mouth daily.       Scheduled:  . antiseptic oral rinse  7 mL Mouth Rinse BID  . aspirin  325 mg Oral Daily  . atorvastatin  40 mg Oral q1800  . insulin aspart  0-15 Units Subcutaneous TID WC  . insulin aspart  0-5 Units Subcutaneous QHS  . [START ON 01/31/2014] insulin glargine  15 Units Subcutaneous Daily  . insulin starter kit- syringes  1 kit Other Once  . pantoprazole  40 mg Oral Q1200    Assessment: 80 y.o male to start IV heparin infusion for VTE treatment.  PMH of DM2, CAD with CABGx4 in 1997, HTN, stroke, and arthritis. He was brought to ED by familyon 8/31  due to concern about his hydration  status and mental decline Lower extremity venous duplex scan today revealed: Bilateral LLE DVTs. MD notes that DVT likely provoked due to recent trauma to the shin after a fall and his chronic immobility.  He last received prophylaxis dose of lovenox 40 mg SQ q24 hours last night at 20:47. No baseline PTT/PT/INR available.  Baseline H/H 14.2/40.3 and PLTC =157 on admission 01/28/14. No bleeding noted.     Goal of Therapy:  Heparin level 0.3-0.7 units/ml Monitor platelets by anticoagulation protocol: Yes   Plan:  Heparin 4000 unit bolus IV x1 Start heparin IV drip 1300 units/hr Heparin level in 8 hours Heparin level and CBC daily AM labs.  Arman Bogus 01/30/2014,2:04 PM  Correction : 80 y.o male

## 2014-01-30 NOTE — Progress Notes (Addendum)
Bilateral lower extremity venous duplex completed.  Right:  DVT noted in the saphenofemoral junction, common femoral vein, profunda vein, femoral vein, and popliteal vein.  No evidence of superficial thrombosis.  No Baker's cyst.  Left: DVT noted in the posterior tibial vein.  No evidence of superficial thrombosis.  No Baker's cyst.

## 2014-01-30 NOTE — Progress Notes (Addendum)
Inpatient Diabetes Program Recommendations  AACE/ADA: New Consensus Statement on Inpatient Glycemic Control (2013)  Target Ranges:  Prepandial:   less than 140 mg/dL      Peak postprandial:   less than 180 mg/dL (1-2 hours)      Critically ill patients:  140 - 180 mg/dL   Reason for Visit: Followed up on referral from 01/29/14.   Results for Colin [name], Colin [name] (MRN [id number]) as of 01/30/2014 13:12  Ref. Range 01/28/2014 19:18  Hemoglobin A1C Latest Range: <5.7 % 14.0 (H)   Based on patient's A1C, he will need insulin at discharge. May be able to only be on 1 shot daily (basal) plus oral agents.  Discussed with Medical Student.  Discussed with patient's son-in law at the bedside.  Patient is currently sleeping.  Told son-in law about results of A1C and the likely need for insulin. Discussed basal insulin and its use.  Asked him and his wife to begin practicing giving insulin while patient is in the hospital.  RN demonstrated use and states that she will bring insulin starter kit to family.  Patient is still very resistant to insulin, however explained to son-in law that this is important to prevent further hospitalizations and complications.  Also briefly discussed diet with son-in law.  He states that [name] drinks at least a gallon of chocolate milk every other day.  Explained that this has lots of sugar and should be limited maybe to one glass daily.  He also drinks Diet Dr. Malachi Bonds.  Encouraged son-in law to give injection to patient at supper time. Will follow-up on 01/31/14. Adah Perl, RN, BC-ADM Inpatient Diabetes Coordinator Pager [phone]

## 2014-01-30 NOTE — Progress Notes (Signed)
Subjective: Colin [name] is a 80 YO man with a PMH of DM2, CAD with CABGx4 in 1997, HTN, stroke, and arthritis who was brought to the ED on Monday by his family because they were concerned about his hydration status and mental decline. Colin [name] was found to be hyponatremic and hyperglycemic. Overnight, the patient's vitals were stable. On exam this afternoon, the patient was somewhat cooperative and responsive to questioning when prompted by his son-in-law. He states that he is very tired and that he doesn't feel well. When asked about the pain, he doesn't respond. He did not respond to further questioning.   I spoke at length with the patient's son-in-law regarding our current plans for the patient and the importance of compliance with the diet and medication recommendations that we will be starting for the patient. Son-in-law reports understanding.    Objective: Vital signs in last 24 hours: Filed Vitals:   01/29/14 0540 01/29/14 1530 01/29/14 2135 01/30/14 0521  BP: 140/85 159/66 145/58 143/57  Pulse: 89 80 71 80  Temp: 98.4 F (36.9 C) 98.2 F (36.8 C) 98.2 F (36.8 C) 98.3 F (36.8 C)  TempSrc: Oral Oral Oral Oral  Resp: 18 16 16 16   Height:      Weight: 83.19 kg (183 lb 6.4 oz)   84 kg (185 lb 3 oz)  SpO2: 100% 97% 100% 98%   Weight change: -6.719 kg (-14 lb 13 oz)  Intake/Output Summary (Last 24 hours) at 01/30/14 1354 Last data filed at 01/30/14 0900  Gross per 24 hour  Intake    240 ml  Output      0 ml  Net    240 ml   BP 143/57  Pulse 80  Temp(Src) 98.3 F (36.8 C) (Oral)  Resp 16  Ht 5' 5"  (1.651 m)  Wt 84 kg (185 lb 3 oz)  BMI 30.82 kg/m2  SpO2 98% General appearance: fatigued, mild distress and slowed mentation Head: Normocephalic, without obvious abnormality, atraumatic Lungs: patient wouldn't sit up for lung exam Heart: regular rate and rhythm, S1, S2 normal, no murmur, click, rub or gallop Extremities: R leg 1+ edema to the shin, tender to the touch  with healing scab present over the shin. No swelling, redness, or edema on the L leg.  Basic Metabolic Panel:  Recent Labs Lab 01/30/14 0046 01/30/14 0559  NA 128* 132*  K 4.8 4.6  CL 96 98  CO2 21 22  GLUCOSE 291* 226*  BUN 36* 31*  CREATININE 1.46* 1.33  CALCIUM 8.5 8.5    Recent Labs Lab 01/28/14 1130 01/29/14 0843  LIPASE 164* 54   CBC:  Recent Labs Lab 01/28/14 1130  WBC 7.4  NEUTROABS 5.4  HGB 14.2  HCT 40.3  MCV 83.6  PLT 157   CBG:  Recent Labs Lab 01/28/14 1658 01/28/14 2129 01/29/14 1644 01/29/14 2134 01/30/14 0818 01/30/14 1135  GLUCAP 335* 250* 363* 230* 196* 315*   Hemoglobin A1C:  Recent Labs Lab 01/28/14 1918  HGBA1C 14.0*   Fasting Lipid Panel:  Recent Labs Lab 01/29/14 0100  CHOL 214*  HDL 33*  LDLCALC 160*  TRIG 103  CHOLHDL 6.5   Urinalysis:  Recent Labs Lab 01/29/14 0148  COLORURINE YELLOW  LABSPEC 1.024  PHURINE 5.0  GLUCOSEU >1000*  HGBUR SMALL*  BILIRUBINUR NEGATIVE  KETONESUR 15*  PROTEINUR NEGATIVE  UROBILINOGEN 0.2  NITRITE NEGATIVE  LEUKOCYTESUR LARGE*   Studies/Results: Mr Brain Wo Contrast  01/29/2014   CLINICAL DATA:  Decreased  oral intake. History of falls. Hypertensive diabetic 80 year old male.  EXAM: MRI HEAD WITHOUT CONTRAST  TECHNIQUE: Multiplanar, multiecho pulse sequences of the brain and surrounding structures were obtained without intravenous contrast.  COMPARISON:  01/28/2014 head CT.  12/14/2005 brain MR.  FINDINGS: Small acute nonhemorrhagic infarcts involving the left aspect of the posterior body of the corpus callosum and left aspect of the splenium of the corpus callosum.  Remote infarcts basal ganglia and mid aspect of the left coronal radiata. Remote left parietal lobe infarct with encephalomalacia. Remote small thalamic infarcts bilaterally. Remote right paracentral pontine infarct. Remote left cerebellar infarct.  Prominent small vessel disease type changes.  Global atrophy without  hydrocephalus.  No intracranial mass lesion noted on this unenhanced exam.  Cervical spondylotic changes C4-5. Cervical medullary junction, pituitary region, pineal region and orbital structures unremarkable.  Major intracranial vascular structures are patent.  IMPRESSION: Small acute nonhemorrhagic infarcts involving the left aspect of the posterior body of the corpus callosum and left aspect of the splenium of the corpus callosum.  Remote infarcts and prominent small vessel disease type changes as detailed above.  Global atrophy without hydrocephalus.   Electronically Signed   By: Chauncey Cruel M.D.   On: 01/29/2014 00:25   Medications: I have reviewed the patient's current medications. Scheduled Meds: . antiseptic oral rinse  7 mL Mouth Rinse BID  . aspirin  325 mg Oral Daily  . atorvastatin  40 mg Oral q1800  . enoxaparin (LOVENOX) injection  40 mg Subcutaneous Q24H  . insulin aspart  0-15 Units Subcutaneous TID WC  . insulin aspart  0-5 Units Subcutaneous QHS  . [START ON 01/31/2014] insulin glargine  15 Units Subcutaneous Daily  . insulin starter kit- syringes  1 kit Other Once  . pantoprazole  40 mg Oral Q1200   Continuous Infusions:  PRN Meds:. Assessment/Plan: Principal Problem:   History of stroke Active Problems:   DIABETES MELLITUS, TYPE II   Dehydration   Hyponatremia   Dysphagia, unspecified(787.20)   Mild malnutrition   Weakness   Stroke  Acute and chronic stroke: Patient states that he doesn't feel well, however when asked about this, he refuses to expand. Duplex US revealed right DVT of the common femoral, profunda, femoral, and popliteal veins. No evidence of superficial thrombosis. No Baker's cyst. Left: No evidence of DVT, superficial thrombosis, or Baker's cyst. EKG revealed a sinus arrhythmia. Echo showed mild LVH, EF of 55-60%, with no aortic regurg or LA enlargement. Bilateral carotid artery duplex completed was completed today showing 1-39% ICA stenosis. Vertebral  artery flow is antegrade.  -Neuro follow up prior to discharge  DVT/PE: Patient has been receiving Lovenox 40 mg SQ daily since admission. On admission, his RLE had 1+ edema with a healing scab on his shin. Patient reports tenderness in his legs bilaterally that the family describes as chronic muscle pain. Lower extremity venous duplex scan today revealed: Right DVT noted in the saphenofemoral junction, common femoral vein, profunda vein, femoral vein, and popliteal vein. No evidence of superficial thrombosis. No Baker's cyst. Left: DVT noted in the posterior tibial vein. No evidence of superficial thrombosis. No Baker's cyst.  DVT likely provoked due to recent trauma to the shin after a fall and his chronic immobility. -Start Heparin and discuss long-term coagulation plans -ASA 325 mg PO qd   Memory loss: Attempted the MMSE with the patient this afternoon. Patient was only able to answer the orientation questions before becoming too tired/sleepy/uncooperative. He only got 2/10 questions  correct, indicating significant cognitive decline. Although the family states that his behavior/intellect has worsened only over the past 2 months, it is likely that the patient's cognitive decline began long before 2 months ago and the family was able to compensate for his losses. We could consider starting a cholinesterase inhibitor, such as donepezil, rivastigmine, or galantimine, or memantine, which is an NMDA antagonist.  -Follow as outpatient  Depression: Screened the patient for depression today with SIG E CAPS questions. Patient reported that he has been sleeping more, that he is losing interest in things that used to interest him, that he feels guilty, that he lacks energy, that he has trouble concentrating, that his cognition is reduced, and that his appetite has been reduced. He denied anxiety and suicidal or homicidal ideations. Plan to discuss starting treatment vs conservative therapy for patient and  family. -Recommend outpatient follow up  Dehydration/Hyponatremia: On admission, serum Na+ was 127 and serum glucose was 421. Patient's hyponatremia likely to be caused by his fluid deficit. Na+ up to 132 this morning, CBG at 315 at lunch.  -NS at 75 mL/hr  -BMET Q6H  -Cardiac monitoring  -Daily weights  -Routine I/O's   Hyperglycemia/DM2: Patient occasionally takes Metformin 500 mg po daily at home. CBG on admission was 421 - most recent was 315 at 1135 on 01/30/14. Recent HbA1c was 14.0. Spoke with the diabetic educator today and she recommends that the patient be on insulin at discharge because of his significantly elevated blood glucose. Diabetic coordinator believes that a once daily basal dose combined with oral agents would be a good option for Colin [name]. Patient has refused insulin since admission and blood glucose has ranged from 196-363 while he's been in the hospital. The patient is also non-compliant with his carb-modified diet as his family brought him dinner last night of fried chicken livers. Will discuss the best plan of treatment with the family and Dr. Ellwood Dense this afternoon. -Carb modified diet  -Sliding scale insulin-sensitive  -CBG 4 times daily  -Consult with diabetic educator for patient and family   Elevated lipase: Patient's lipase level was elevated to 164 on admission. Repeat lipase yesterday was normal at 54. Can continue to follow if patient reports epigastric pain or pain radiating to his back. Elevated lipase is a non-specific finding and likely due to his anorexia, dehydration, and electrolyte abnormalities.   HTN: Patient is hypertensive on admission 130s-180s/80s. Patient is on lisinopril 5 mg po daily at home, but has not been taking medications for approx 1 month. Resume HTN meds upon discharge with BP goal of 130/90. -HTN meds held due to stroke findings currently  GERD: Patient is on protonix 40 mg at home.  -Continue protonix   HLD: Lipid panel on  10/29/2009 showed Cholesterol 160, TG 94, HDL 45, LDL 96. Repeat lipid panel on 01/30/2104 showed cholesterol 214, TG 103, HDL 33, LDL 160.  -Atorvastatin 40 mg PO initiated   Dispo: Disposition is deferred at this time, awaiting improvement of current medical problems. Anticipated discharge in approximately 1 day.   The patient does have a current PCP Renato Shin, MD) and does need an [facility] hospital follow-up appointment after discharge.   The patient does not have transportation limitations that hinder transportation to clinic appointments.  This is a Careers information officer Note.  The care of the patient was discussed with Dr. Randell Patient and the assessment and plan formulated with their assistance.  Please see their attached note for official documentation of the daily encounter.  LOS: 2 days   Darcus Austin, Med Student 01/30/2014, 1:54 PM

## 2014-01-30 NOTE — Progress Notes (Signed)
Bilateral carotid artery duplex completed:  1-39% ICA stenosis.  Vertebral artery flow is antegrade.     

## 2014-01-30 NOTE — Progress Notes (Signed)
  PROGRESS NOTE MEDICINE TEACHING ATTENDING   Day 2 of stay Patient name: Colin [name]   Medical record number: [id number] Date of birth: 12/13/1932   Met with patient. Examined him. Discussed the recent DVT and its implications with his son-in law.   Assessment and Plan                                                                      New extensive acute right leg DVT - Significant thrombus load. We will consult vascular surgery for an IVC filter. Possible thrombectomy/throbolysis options ? The patient does not seem like a good candidate for those. For now, start heparin. Remove SCDs. We will discuss anticoagulation options with the family, however from talking to the family in the past, the medication compliance of this patient has been extremely questionable. However, the son-in-law (and his daughter who was with [place] over the speakerphone) assure me that they want to start coumadin and will make sure that the patient is compliant and goes to his doctor's visits. They want to meet with the vascular surgeon and explore IVC filter option.   Diabetes2, uncontrolled - Insulin therapy ideal - but I am unsure if the patient's family would be amenable to that. Per our diabetes coordinator's note, she has been talking to the patient's son-in-law and wife about administering a single daily lantus shot. I will follow up on this. If not possible then the patient will have to be discharged on oral hypoglycemics. Patient has been on metformin in the past, however he has had elevated creatinine in the hospital. He will need constant creatinine checks if he were to be on metformin at home.   Recent Labs Lab 01/29/14 0843 01/29/14 1310 01/29/14 1812 01/30/14 0046 01/30/14 0559  BUN 35* 35* 34* 36* 31*  CREATININE 1.38* 1.37* 1.50* 1.46* 1.33   Stroke and physical weakness - Will continue physical therapy at home. Will need 24 hour assistance.  On statin, ongoing stroke work up.   I have discussed the  care of this patient with my IM team residents- Dr Randell Patient and Aundra Dubin. Please see the resident note for details.  [place], [place] 01/30/2014, 2:20 PM.

## 2014-01-31 DIAGNOSIS — E871 Hypo-osmolality and hyponatremia: Secondary | ICD-10-CM

## 2014-01-31 DIAGNOSIS — I6350 Cerebral infarction due to unspecified occlusion or stenosis of unspecified cerebral artery: Principal | ICD-10-CM

## 2014-01-31 LAB — CBC
HCT: 37.2 % — ABNORMAL LOW (ref 39.0–52.0)
HCT: 36.4 % — ABNORMAL LOW (ref 39.0–52.0)
HEMOGLOBIN: 12.5 g/dL — AB (ref 13.0–17.0)
HEMOGLOBIN: 12.6 g/dL — AB (ref 13.0–17.0)
MCH: 28.6 pg (ref 26.0–34.0)
MCH: 29.3 pg (ref 26.0–34.0)
MCHC: 33.6 g/dL (ref 30.0–36.0)
MCHC: 34.6 g/dL (ref 30.0–36.0)
MCV: 85.1 fL (ref 78.0–100.0)
MCV: 84.7 fL (ref 78.0–100.0)
PLATELETS: 154 10*3/uL (ref 150–400)
Platelets: 155 10*3/uL (ref 150–400)
RBC: 4.37 MIL/uL (ref 4.22–5.81)
RBC: 4.3 MIL/uL (ref 4.22–5.81)
RDW: 12.8 % (ref 11.5–15.5)
RDW: 12.8 % (ref 11.5–15.5)
WBC: 4.7 10*3/uL (ref 4.0–10.5)
WBC: 5.2 10*3/uL (ref 4.0–10.5)

## 2014-01-31 LAB — BASIC METABOLIC PANEL
Anion gap: 13 (ref 5–15)
BUN: 21 mg/dL (ref 6–23)
CHLORIDE: 100 meq/L (ref 96–112)
CO2: 21 meq/L (ref 19–32)
Calcium: 8.6 mg/dL (ref 8.4–10.5)
Creatinine, Ser: 1.19 mg/dL (ref 0.50–1.35)
GFR calc Af Amer: 65 mL/min — ABNORMAL LOW (ref >90)
GFR, EST NON AFRICAN AMERICAN: 56 mL/min — AB (ref >90)
GLUCOSE: 131 mg/dL — AB (ref 70–99)
Potassium: 4.9 mEq/L (ref 3.7–5.3)
Sodium: 134 mEq/L — ABNORMAL LOW (ref 137–147)

## 2014-01-31 LAB — GLUCOSE, CAPILLARY
GLUCOSE-CAPILLARY: 140 mg/dL — AB (ref 70–99)
Glucose-Capillary: 275 mg/dL — ABNORMAL HIGH (ref 70–99)
Glucose-Capillary: 210 mg/dL — ABNORMAL HIGH (ref 70–99)

## 2014-01-31 LAB — PROTIME-INR
INR: 1.15 (ref 0.00–1.49)
PROTHROMBIN TIME: 14.7 s (ref 11.6–15.2)

## 2014-01-31 LAB — HEPARIN LEVEL (UNFRACTIONATED)
HEPARIN UNFRACTIONATED: 1.1 [IU]/mL — AB (ref 0.30–0.70)
HEPARIN UNFRACTIONATED: 0.63 [IU]/mL (ref 0.30–0.70)

## 2014-01-31 LAB — MAGNESIUM: Magnesium: 1.8 mg/dL (ref 1.5–2.5)

## 2014-01-31 MED ORDER — COUMADIN BOOK
Freq: Once | Status: AC
  Administered 2014-01-31: 14:00:00
  Filled 2014-01-31: qty 1

## 2014-01-31 MED ORDER — WARFARIN SODIUM 7.5 MG PO TABS
7.5000 mg | ORAL_TABLET | Freq: Once | ORAL | Status: AC
  Administered 2014-01-31: 7.5 mg via ORAL
  Filled 2014-01-31: qty 1

## 2014-01-31 MED ORDER — FONDAPARINUX SODIUM 7.5 MG/0.6ML ~~LOC~~ SOLN: 7.5000 mg | SUBCUTANEOUS | Status: DC

## 2014-01-31 MED ORDER — WARFARIN - PHARMACIST DOSING INPATIENT: Freq: Every day | Status: DC

## 2014-01-31 MED ORDER — ENOXAPARIN SODIUM 150 MG/ML ~~LOC~~ SOLN
130.0000 mg | SUBCUTANEOUS | Status: DC
Start: 2014-01-31 — End: 2014-01-31
  Filled 2014-01-31: qty 1

## 2014-01-31 MED ORDER — WARFARIN SODIUM 5 MG PO TABS: 5.0000 mg | ORAL_TABLET | Freq: Every day | ORAL | Status: DC

## 2014-01-31 MED ORDER — ATORVASTATIN CALCIUM 40 MG PO TABS: 40.0000 mg | ORAL_TABLET | Freq: Every day | ORAL | Status: AC

## 2014-01-31 MED ORDER — INSULIN GLARGINE 100 UNIT/ML ~~LOC~~ SOLN: 15.0000 [IU] | Freq: Every day | SUBCUTANEOUS | Status: DC

## 2014-01-31 MED ORDER — FONDAPARINUX SODIUM 7.5 MG/0.6ML ~~LOC~~ SOLN
7.5000 mg | SUBCUTANEOUS | Status: DC
  Administered 2014-01-31: 7.5 mg via SUBCUTANEOUS
  Filled 2014-01-31: qty 0.6

## 2014-01-31 MED ORDER — ASPIRIN 325 MG PO TABS: 325.0000 mg | ORAL_TABLET | Freq: Every day | ORAL | Status: DC

## 2014-01-31 MED ORDER — HEPARIN (PORCINE) IN NACL 100-0.45 UNIT/ML-% IJ SOLN
1000.0000 [IU]/h | INTRAMUSCULAR | Status: DC
  Filled 2014-01-31: qty 250

## 2014-01-31 MED ORDER — ENOXAPARIN (LOVENOX) PATIENT EDUCATION KIT
PACK | Freq: Once | Status: DC
Start: 2014-01-31 — End: 2014-01-31
  Filled 2014-01-31: qty 1

## 2014-01-31 MED ORDER — ENOXAPARIN SODIUM 100 MG/ML ~~LOC~~ SOLN: 130.0000 mg | SUBCUTANEOUS | Status: DC

## 2014-01-31 MED ORDER — WARFARIN VIDEO
Status: AC
  Administered 2014-01-31: 14:00:00

## 2014-01-31 NOTE — Progress Notes (Signed)
  I have seen and examined the patient, and reviewed the daily progress note by Alford Highland, MS3 and discussed the care of the patient with them. Please see my progress note from 01/31/2014 for further details regarding assessment and plan.    Signed:  Griffin Basil, MD 01/31/2014, 3:36 PM

## 2014-01-31 NOTE — Progress Notes (Signed)
Speech Language Pathology Treatment:    Patient Details Name: Colin [name] MRN: [id number] DOB: 1933-03-12 Today's Date: 01/31/2014 Time: 9810-2548 SLP Time Calculation (min): 16 min  Assessment / Plan / Recommendation Clinical Impression  Pt appears to be tolerating diet well without indications of airway compromise.  He reports to be hungry - note per doc flowsheet, pt refused dinner last night.  SLP educated pt to importance of oral care and he initially agreed to brush his teeth - however declined after SLP set up materials.  Suspect compliance to aspiration precautions may be compromised due to pt's cognition.  He demonstrates a flat affect and only answered approximately 30% of questions.    SLP observed pt eating cracker and consuming water without indications of airway compromise.  Recommend continue diet with intermittent supervision due to pt's impulsivity.  SLP to sign off as pt tolerating regular/thin diet and all education is completed.    HPI HPI: 53 man with a PMH of DM2, CAD with CABGx4 in 1997, HTN, stroke, and arthritis admitted with concern about dehydration and declining health over the past 2 months. Per MD note, pt has had nothing to eat or drink over the past 2 days and unexplained weight loss of 10 pounds over 1-2 months.  MRI revealed Small acute nonhemorrhagic infarcts involving the left aspect of the posterior body of the corpus callosum and left aspect of the splenium of the corpus callosum. Remote infarcts and prominent small vessel disease type changes.  CXR There is no CHF nor other acute cardiopulmonary abnormality.    Pertinent Vitals Pain Assessment: No/denies pain  SLP Plan  All goals met;Discharge SLP treatment due to (comment)    Recommendations Diet recommendations: Regular;Thin liquid Liquids provided via: Cup;Straw Medication Administration:  (as tolerated) Supervision: Patient able to self feed;Intermittent supervision to cue for compensatory  strategies Compensations: Slow rate;Small sips/bites Postural Changes and/or Swallow Maneuvers: Seated upright 90 degrees              Oral Care Recommendations: Oral care BID Follow up Recommendations: None Plan: All goals met;Discharge SLP treatment due to (comment)    [place], [place], [place] [facility] SLP [phone]

## 2014-01-31 NOTE — Progress Notes (Signed)
ANTICOAGULATION CONSULT NOTE - Follow Up Consult  Pharmacy Consult for heparin Indication: DVT  Labs:  Recent Labs  01/28/14 1130  01/29/14 1812 01/30/14 0046 01/30/14 0559 01/30/14 1558 01/31/14 0117  HGB 14.2  --   --   --   --  11.7* 12.5*  HCT 40.3  --   --   --   --  33.9* 37.2*  PLT 157  --   --   --   --  157 154  APTT  --   --   --   --   --  32  --   LABPROT  --   --   --   --   --  14.5  --   INR  --   --   --   --   --  1.13  --   HEPARINUNFRC  --   --   --   --   --   --  1.10*  CREATININE 1.54*  < > 1.50* 1.46* 1.33  --   --   < > = values in this interval not displayed.   Assessment: 80yo male supratherapeutic on heparin with initial dosing for DVT, drawn correctly in opposite arm per RN.  Goal of Therapy:  Heparin level 0.3-0.7 units/ml   Plan:  Will hold heparin gtt x57min then resume at lower rate of 1000 units/hr and check level in 8hr.  Vernard Gambles, PharmD, BCPS  01/31/2014,2:00 AM

## 2014-01-31 NOTE — Discharge Summary (Addendum)
Name: Colin [name] MRN: [id number] DOB: [birth year] 80 y.o. PCP: Romero Belling, MD  Date of Admission: 01/28/2014  9:36 AM Date of Discharge: 01/31/2014 Attending Physician: Aletta Edouard, MD  Discharge Diagnosis: 1. Stroke 2. Hyponatremia  3. Hyperglycemia 4. Acute Right saphenofemoral DVT  Discharge Medications:   Medication List    STOP taking these medications       lisinopril 5 MG tablet  Commonly known as:  PRINIVIL,ZESTRIL     lovastatin 20 MG tablet  Commonly known as:  MEVACOR      TAKE these medications       atorvastatin 40 MG tablet  Commonly known as:  LIPITOR  Take 1 tablet (40 mg total) by mouth daily at 6 PM.     fondaparinux 7.5 MG/0.6ML Soln injection  Commonly known as:  ARIXTRA  Inject 0.6 mLs (7.5 mg total) into the skin daily.  Start taking on:  02/01/2014     insulin glargine 100 UNIT/ML injection  Commonly known as:  LANTUS  Inject 0.15 mLs (15 Units total) into the skin daily.     metFORMIN 500 MG tablet  Commonly known as:  GLUCOPHAGE  Take 500 mg by mouth daily.     omeprazole 20 MG capsule  Commonly known as:  PRILOSEC  Take 20 mg by mouth daily.     warfarin 5 MG tablet  Commonly known as:  COUMADIN  Take 1 tablet (5 mg total) by mouth daily.  Start taking on:  02/01/2014        Disposition and follow-up:   [name] was discharged from [facility] in Stable condition.  At the hospital follow up visit please address:  1.  Please check on his cognitive status. He improved from nonverbal at admission to answering most questions at discharge.  2.  Please address medication compliance including insulin and warfarin.  3.  We did not restart his lisinopril at discharge as he has been noted to have cough with ramipril. Please check his blood pressure and consider starting him on an Angiotensin II Receptor Blocker instead.  4.  His heart rate was 44 - 53 the afternoon of 01/30/2014 and during the day  01/31/2014. Recheck was 60 prior to discharge. He was asymptomatic during this. EKG obtained on 01/31/2014 at 06:55 showed sinus arrhythmia. Please consider obtaining outpatient follow up for him with cardiology.  2.  Labs / imaging needed at time of follow-up: Basic metabolic panel, CBC, PT/INR  3.  Pending labs/ test needing follow-up: none  Follow-up Appointments:     Follow-up Information   Call [facility]. (They will call you to make the follow up appointment for 1-2 months from discharge)    Contact information:   [address] [place] [address] [phone]      Follow up with [facility]     On 02/05/2014. (9:00 am. Please bring your discharge paperwork to your appointment. )    Contact information:   [address] [place] [address] [phone]      Follow up with [facility]. (HHPT and aide)    Contact information:   [address] [place] [address] [phone]       Discharge Instructions: Discharge Instructions   Call MD for:  difficulty breathing, headache or visual disturbances    Complete by:  As directed      Call MD for:  persistant dizziness or light-headedness  Complete by:  As directed      Call MD for:  persistant nausea and vomiting    Complete by:  As directed      Call MD for:  severe uncontrolled pain    Complete by:  As directed      Call MD for:  temperature >100.4    Complete by:  As directed      Diet - low sodium heart healthy    Complete by:  As directed      Increase activity slowly    Complete by:  As directed            Consultations: Neurology, Vascular Surgery  Procedures Performed:  Dg Chest 2 View  01/28/2014   CLINICAL DATA:  Shortness of breath and weakness with history of diabetes and previous tobacco use. PA and lateral chest x-ray of May 27, 2010.  EXAM: CHEST  2 VIEW  COMPARISON:  PA and lateral chest of May 27, 2010  FINDINGS: The lungs are adequately inflated and clear. The heart and pulmonary vascularity are normal. The patient has undergone previous CABG. There are 6 intact sternal wires present. There is no pleural effusion. The bony thorax is unremarkable.  IMPRESSION: There is no CHF nor other acute cardiopulmonary abnormality.   Electronically Signed   By: David  [place]   On: 01/28/2014 11:05   Ct Head Wo Contrast  01/28/2014   CLINICAL DATA:  Decreased oral intake, numerous falls, history hypertension, diabetes, stroke  EXAM: CT HEAD WITHOUT CONTRAST  TECHNIQUE: Contiguous axial images were obtained from the base of the skull through the vertex without intravenous contrast.  COMPARISON:  07/25/2009  FINDINGS: Generalized atrophy.  Normal ventricular morphology.  No midline shift or mass effect.  Small vessel chronic ischemic changes of deep cerebral white matter.  Old LEFT basal ganglia and RIGHT thalamic lacunar infarcts.  Small cortical infarct high LEFT parietal region appear subacute to old in age though new since prior CT.  No intracranial hemorrhage, mass lesion, or additional acute infarction.  Benign-appearing BILATERAL basal ganglia calcifications.  No extra-axial fluid collections.  Visualized paranasal sinuses and mastoid air cells clear.  Bones unremarkable.  IMPRESSION: Small subacute to old high LEFT parietal cortical infarct new since 2011.  Atrophy with small vessel chronic ischemic changes of deep cerebral white matter.  Old lacunar infarcts LEFT basal ganglia and RIGHT thalamus.   Electronically Signed   By: Ulyses Southward M.D.   On: 01/28/2014 10:58   Mr Brain Wo Contrast  01/29/2014   CLINICAL DATA:  Decreased oral intake. History of falls. Hypertensive diabetic 80 year old male.  EXAM: MRI HEAD WITHOUT CONTRAST  TECHNIQUE: Multiplanar, multiecho pulse sequences of the brain and surrounding structures were obtained without intravenous contrast.  COMPARISON:  01/28/2014 head CT.  12/14/2005  brain MR.  FINDINGS: Small acute nonhemorrhagic infarcts involving the left aspect of the posterior body of the corpus callosum and left aspect of the splenium of the corpus callosum.  Remote infarcts basal ganglia and mid aspect of the left coronal radiata. Remote left parietal lobe infarct with encephalomalacia. Remote small thalamic infarcts bilaterally. Remote right paracentral pontine infarct. Remote left cerebellar infarct.  Prominent small vessel disease type changes.  Global atrophy without hydrocephalus.  No intracranial mass lesion noted on this unenhanced exam.  Cervical spondylotic changes C4-5. Cervical medullary junction, pituitary region, pineal region and orbital structures unremarkable.  Major intracranial vascular structures are patent.  IMPRESSION: Small acute nonhemorrhagic infarcts involving the left  aspect of the posterior body of the corpus callosum and left aspect of the splenium of the corpus callosum.  Remote infarcts and prominent small vessel disease type changes as detailed above.  Global atrophy without hydrocephalus.   Electronically Signed   By: Bridgett Larsson M.D.   On: 01/29/2014 00:25    2D Echo: Transthoracic echocardiography. Image quality was adequate. The study was technically difficult, as a result of poor acoustic windows. - Left ventricle: The cavity size was normal. Wall thickness was increased in a pattern of mild LVH. Systolic function was normal. The estimated ejection fraction was in the range of 55% to 60%. The study is not technically sufficient to allow evaluation of LV diastolic function. - Aortic valve: Sclerosis without stenosis. There was no regurgitation. - Left atrium: The atrium was normal in size. On: 01/29/2014  Lower Extremity Venous Duplex: Findings consistent with acute deep vein thrombosis involving the right saphenofemoral junction, common femoral vein, profunda vein, femoral vein, and popliteal vein. - Deep vein thrombosis involving the left  posterior tibial vein. - Findings consistent with acute deep vein thrombosis involving the left lower extremity. - No evidence of Baker&'s cyst on the right or left. On: 01/30/2014  Carotid Doppler: Bilateral: 1-39% ICA stenosis. Vertebral artery flow is antegrade. Duplex imaging of the brachial artery demonstrates triphasic waveforms. Right: ICA/CCA ratio is 1. Moderate ECA stenosis. Left: ICA/CCA ratio is 1.04. Mild ECA stenosis. On: 01/30/2014  Admission HPI: [name] is a 80 YO man with a PMH of DM2, CAD with CABGx4 in 1997, HTN, stroke, and arthritis who was brought to the ED today by his family because they were concerned about dehydration and the patient's declining health over the past 2 months. The patient has had nothing to eat or drink over the past 2 days and that he has become increasingly sleepy, had an irregular breathing pattern, and that he complained of "all over" pain over the past 2 days. They state that they took a road trip with the patient about 2 months ago and since then, his cognitive and physical abilities have greatly declined. The patient has refused to take any of his medications for the past month. The patient is only able to speak only 1-2 words at a time and that requires much effort and persuasion. [name] has been falling more frequently at home when he tries to get out of bed on his own. He uses a scooter at home to get around and his son-in-law frequently has to pick him up to move anywhere. Over the past 1-2 months, the patient has experienced an unexplained 10 pound weight loss. Patient wears diapers and his family denies seeing any blood in his stool. They are unsure of his last colonoscopy. His family reports that [name]'s mood has significantly worsened over the past 2 months as well. He used to be cheerful and talkative, but now he becomes tearful and rarely speaks. He has been diagnosed with depression in the past, but has not been taking his  medications. Two months ago, patient was talkative, humorous, mobile, and only needed minimal help with ADLs. Patient endorses headache and occasional cough. He denies any abdominal pain, nausea, vomiting, diarrhea, dysuria, hematuria, hematochezia, or leg edema.   Level V Caveat: All information was provided by the patient's daughter and son-in-law who care for [name].    Hospital Course by problem list: Principal Problem:   History of stroke Active Problems:   DIABETES MELLITUS, TYPE II  Dehydration   Hyponatremia   Dysphagia, unspecified(787.20)   Mild malnutrition   Weakness   Stroke   1. Acute and chronic stroke: Patient's family states that [name] was fine until about 2 months ago. At that point he began to fall more often around the hose and was experiencing some cognitive changes. He refused to take his medications for 1 month prior to presentation as well. On admission, patient was unresponsive to questioning and had great difficulty following simple commands. MRI revealed both small acute stroke and presence of old stroke. Full stroke workup was performed EKG performed, which demonstrated irregular sinus rhythm with occasional premature ventricular complexes. Carotid Doppler indicated stenosis between 1-39%. LE Doppler revealed large proximal DVT. After his hyponatremia and dehydration improved, he became somewhat more cooperative, but still had difficulty following complex commands. Speech, PT, and OT evaluated the patient while inpatient. Patient's family requested home health and a hospital bed and a wheel chair to use at home. Follow up with [facility] 2 months after discharge   2. R LE DVT: On admission, patient's RLE had 1+ edema with a healing scab on his shin from a recent fall. Patient reports tenderness in his legs, R>L. Lower extremity venous duplex scan on 01/30/2014 revealed: Right DVT noted in the saphenofemoral junction, common femoral vein,  profunda vein, femoral vein, and popliteal vein. No evidence of superficial thrombosis. No Baker's cyst. Left: DVT noted in the posterior tibial vein. No evidence of superficial thrombosis. No Baker's cyst. DVT likely provoked due to recent trauma to the shin after a fall and his chronic immobility. Vascular surgery consulted with the patient and recommended long-term anticoagulation rather than IVC filter placement. Communicated this to the family and they are willing to start Coumadin at home. Fondaparinux per pharmacy to bridge coumadin. Will [facility]ag checks at [facility]   3. Hyperglycemia/DM2: Prior to admission patient occasionally takes Metformin 500 mg po daily at home. CBG on admission was 421. Recent HbA1c was 14.0. The patient is has been non-compliant with his carb-modified diet while in the hospital as well - his family brought him fried chicken for dinner last night. Family also states that the patient drinks a gallon of chocolate milk every other day. Diabetic coordinator spoke at length with patient's family about diet modifications and using once-daily insulin injections combined with his oral medications to get better control of his glucose level. Family is willing to do the once daily insulin injections, but they request additional help with the insulin injections. Diabetes educator was notified and she will speak with the family prior to discharge. Patient's creatinine has improved to 1.19 today, so metformin can be restarted upon discharge as well. Lantus 15U qd at discharge, Metformin 500 mg at discharge  3. Cognitive decline: Attempted the MMSE while inpatient. Patient was only able to answer the orientation questions before becoming too tired/sleepy/uncooperative. He got 2/10 questions correct, indicating significant cognitive decline. Mini-cog not attempted before discharge because the patient was unresponsive. Although the family states  that his behavior/intellect has worsened only over the past 2 months, it is likely that the patient's cognitive decline began long before 2 months ago and the family was able to compensate for his losses. We could consider starting a cholinesterase inhibitor or memantine to slow progression of cognitive decline. Follow as outpatient with PCP or with neurology.   4. Depression: Screened the patient for depression while inpatient with SIG E CAPS questions. Patient  reported that he has been sleeping more, that he is losing interest in things that used to interest him, that he feels guilty, that he lacks energy, that he has trouble concentrating, that his cognition is reduced, and that his appetite has been reduced. He denied anxiety and suicidal or homicidal ideations. Could consider starting SSRI if patient and/or family is interested. Recommend outpatient follow up with PCP.  5. Dehydration/Hyponatremia: On admission, serum Na+ was 127 and serum glucose was 421. Patient's hyponatremia likely to be caused by his fluid deficit. Na+ 134 on discharge.   6. HTN: Patient is hypertensive on admission 130s-180s/80s. Patient is on lisinopril 5 mg po daily at home, but has not been taking medications for approx 1 month. BP normotensive during hospitalization. Patient reports a history of cough while on ramipril, so home lisinopril was not restarted. Consider starting ARB at follow up with PCP.  7. HLD: Lipid panel on 10/29/2009 showed Cholesterol 160, TG 94, HDL 45, LDL 96. Repeat lipid panel on 01/30/2104 showed cholesterol 214, TG 103, HDL 33, LDL 160. Atorvastatin 40 mg PO initiated.  8. Bradycardia: Patient was bradycardic overnight on 01/31/14 with HR ranging from 44-53. Repeat EKG was performed, showing no changes from prior EKG. Bradycardia likely is normal occurrence while patient is sleeping. Magnesium level within normal limits. Patient is asymptomatic. Consider outpatient follow up with cardiology.  9. GERD:  Continued protonix 40 mg.    Discharge Vitals:   BP 162/76  Pulse 60  Temp(Src) 97.7 F (36.5 C) (Oral)  Resp 16  Ht  (1.651 m)  Wt 186 lb 4.6 oz (84.5 kg)  BMI 31.00 kg/m2  SpO2 99%  Discharge Labs:  Results for orders placed during the hospital encounter of 01/28/14 (from the past 24 hour(s))  GLUCOSE, CAPILLARY     Status: Abnormal   Collection Time    01/30/14  4:50 PM      Result Value Ref Range   Glucose-Capillary 131 (*) 70 - 99 mg/dL  GLUCOSE, CAPILLARY     Status: Abnormal   Collection Time    01/30/14 10:04 PM      Result Value Ref Range   Glucose-Capillary 137 (*) 70 - 99 mg/dL   Comment 1 Documented in Chart     Comment 2 Notify RN    HEPARIN LEVEL (UNFRACTIONATED)     Status: Abnormal   Collection Time    01/31/14  1:17 AM      Result Value Ref Range   Heparin Unfractionated 1.10 (*) 0.30 - 0.70 IU/mL  CBC     Status: Abnormal   Collection Time    01/31/14  1:17 AM      Result Value Ref Range   WBC 4.7  4.0 - 10.5 K/uL   RBC 4.37  4.22 - 5.81 MIL/uL   Hemoglobin 12.5 (*) 13.0 - 17.0 g/dL   HCT 14.7 (*) 82.9 - 56.2 %   MCV 85.1  78.0 - 100.0 fL   MCH 28.6  26.0 - 34.0 pg   MCHC 33.6  30.0 - 36.0 g/dL   RDW 13.0  86.5 - 78.4 %   Platelets 154  150 - 400 K/uL  CBC     Status: Abnormal   Collection Time    01/31/14  7:50 AM      Result Value Ref Range   WBC 5.2  4.0 - 10.5 K/uL   RBC 4.30  4.22 - 5.81 MIL/uL   Hemoglobin 12.6 (*) 13.0 - 17.0  g/dL   HCT 78.2 (*) 95.6 - 21.3 %   MCV 84.7  78.0 - 100.0 fL   MCH 29.3  26.0 - 34.0 pg   MCHC 34.6  30.0 - 36.0 g/dL   RDW 08.6  57.8 - 46.9 %   Platelets 155  150 - 400 K/uL  BASIC METABOLIC PANEL     Status: Abnormal   Collection Time    01/31/14  7:50 AM      Result Value Ref Range   Sodium 134 (*) 137 - 147 mEq/L   Potassium 4.9  3.7 - 5.3 mEq/L   Chloride 100  96 - 112 mEq/L   CO2 21  19 - 32 mEq/L   Glucose, Bld 131 (*) 70 - 99 mg/dL   BUN 21  6 - 23 mg/dL   Creatinine, Ser 6.29  0.50 -  1.35 mg/dL   Calcium 8.6  8.4 - 52.8 mg/dL   GFR calc non Af Amer 56 (*) >90 mL/min   GFR calc Af Amer 65 (*) >90 mL/min   Anion gap 13  5 - 15  HEPARIN LEVEL (UNFRACTIONATED)     Status: None   Collection Time    01/31/14  7:50 AM      Result Value Ref Range   Heparin Unfractionated 0.63  0.30 - 0.70 IU/mL  PROTIME-INR     Status: None   Collection Time    01/31/14  7:50 AM      Result Value Ref Range   Prothrombin Time 14.7  11.6 - 15.2 seconds   INR 1.15  0.00 - 1.49  GLUCOSE, CAPILLARY     Status: Abnormal   Collection Time    01/31/14  7:55 AM      Result Value Ref Range   Glucose-Capillary 140 (*) 70 - 99 mg/dL  GLUCOSE, CAPILLARY     Status: Abnormal   Collection Time    01/31/14 12:00 PM      Result Value Ref Range   Glucose-Capillary 275 (*) 70 - 99 mg/dL  GLUCOSE, CAPILLARY     Status: Abnormal   Collection Time    01/31/14  4:31 PM      Result Value Ref Range   Glucose-Capillary 210 (*) 70 - 99 mg/dL    Signed: Griffin Basil, MD 01/31/2014, 4:44 PM    Services Ordered on Discharge: home health PT Equipment Ordered on Discharge: hospital bed, wheelchair

## 2014-01-31 NOTE — Progress Notes (Signed)
Subjective: Bradycardic to the 44 overnight while patient was sleeping. Remained asymptomatic. BP stable. Repeat EKG largely unchanged from before with HR 87.  He appears to be in better spirits this AM. Sitting up in chair eating breakfast. He has no complaints today. Denies chest pain or shortness of breath. Tolerating meal without nausea/vomiting or abdominal pain.  Objective: Vital signs in last 24 hours: Filed Vitals:   01/30/14 1659 01/30/14 2156 01/31/14 0622 01/31/14 0630  BP: 146/68 148/88 156/56   Pulse: 53 46 44 48  Temp: 98.5 F (36.9 C) 98.4 F (36.9 C) 98.1 F (36.7 C)   TempSrc: Oral Oral Oral   Resp: Height:      Weight:   186 lb 4.6 oz (84.5 kg)   SpO2: 99% 100% 100%    Weight change: 1 lb 1.6 oz (0.5 kg)  Intake/Output Summary (Last 24 hours) at 01/31/14 0910 Last data filed at 01/31/14 0626  Gross per 24 hour  Intake    240 ml  Output    950 ml  Net   -710 ml   General: Vital signs reviewed. NAD  Head: Normocephalic and atraumatic.  Eyes: conjunctivae normal, no scleral icterus.  Neck: Supple, trachea midline. Resp: clear to auscultation bilaterally, no w/r/r Chest: RRR, no m/r/g Musculoskeletal: No joint deformities, erythema, or stiffness, ROM full and nontender.  Extremities: No lower extremity edema in left lower extremity, 1+ edema in right lower extremity, pulses symmetric and intact bilaterally. No cyanosis or clubbing. Neurological: Awake and alert. Answering questions appropriately Skin: Warm, dry and intact. No rashes or erythema. Psychiatric: In better spirits today.  Lab Results: Basic Metabolic Panel:  Recent Labs Lab 01/30/14 0559 01/31/14 0750  NA 132* 134*  K 4.6 4.9  CL 98 100  CO2 22 21  GLUCOSE 226* 131*  BUN 31* 21  CREATININE 1.33 1.19  CALCIUM 8.5 8.6   Liver Function Tests:  Recent Labs Lab 01/28/14 1130  AST 24  ALT 23  ALKPHOS 68  BILITOT 0.5  PROT 8.3  ALBUMIN 3.3*    Recent Labs Lab  01/28/14 1130 01/29/14 0843  LIPASE 164* 54    CBC:  Recent Labs Lab 01/28/14 1130  01/31/14 0117 01/31/14 0750  WBC 7.4  < > 4.7 5.2  NEUTROABS 5.4  --   --   --   HGB 14.2  < > 12.5* 12.6*  HCT 40.3  < > 37.2* 36.4*  MCV 83.6  < > 85.1 84.7  PLT 157  < > 154 155  < > = values in this interval not displayed.  BNP:  Recent Labs Lab 01/28/14 1130  PROBNP 438.6   CBG:  Recent Labs Lab 01/29/14 2134 01/30/14 0818 01/30/14 1135 01/30/14 1650 01/30/14 2204 01/31/14 0755  GLUCAP 230* 196* 315* 131* 137* 140*   Hemoglobin A1C:  Recent Labs Lab 01/28/14 1918  HGBA1C 14.0*   Fasting Lipid Panel:  Recent Labs Lab 01/29/14 0100  CHOL 214*  HDL 33*  LDLCALC 160*  TRIG 103  CHOLHDL 6.5   Urinalysis:  Recent Labs Lab 01/29/14 0148  COLORURINE YELLOW  LABSPEC 1.024  PHURINE 5.0  GLUCOSEU >1000*  HGBUR SMALL*  BILIRUBINUR NEGATIVE  KETONESUR 15*  PROTEINUR NEGATIVE  UROBILINOGEN 0.2  NITRITE NEGATIVE  LEUKOCYTESUR LARGE*    Medications: I have reviewed the patient's current medications. Scheduled Meds: . antiseptic oral rinse  7 mL Mouth Rinse BID  . aspirin  325 mg Oral Daily  .  atorvastatin  40 mg Oral q1800  . enoxaparin   Does not apply Once  . insulin aspart  0-15 Units Subcutaneous TID WC  . insulin aspart  0-5 Units Subcutaneous QHS  . insulin glargine  15 Units Subcutaneous Daily  . pantoprazole  40 mg Oral Q1200   Assessment/Plan: Principal Problem:   History of stroke Active Problems:   DIABETES MELLITUS, TYPE II   Dehydration   Hyponatremia   Dysphagia, unspecified(787.20)   Mild malnutrition   Weakness   Stroke  Failure to thrive/new onset dysarthria/dysphagia/weakness: CT head showed small subacute to old high left parietal cortical infarct new since 2011, atrophy with small vessel chronic ischemic changes of deep cerebral white matter and old lacunar infarcts in the left basal ganglia and right thalamus. MRI brain with  small acute nonhemorrhagic infarcts involving the left aspect of the posterior body of the corpus callosum and left aspect of the splenium of the corpus callosum. Neuro consulted and recommend stroke work up. SLP evaluated and recommend regular diet. PT/OT evaluated. PT recommends home health and noted that family is against SNF. OT does not require follow up. Fasting lipid panel with hyperlipidemia. Hgb A1c 14. Echo with LV EF 55-60%. Carotid dopplers 1-39% ICA stenosis. -Atorvastatin  daily -ASA  daily -Will set up outpatient follow up with neurology  Cognitive decline: per family concerned that pt is depressed. MMSE attempted with the patient. Patient was only able to answer the orientation questions before becoming too tired/sleepy/uncooperative. He only got 2/10 questions correct, indicating significant cognitive decline. Appears to have improved today. -continue to monitor  RLE edema: unilateral LE edema. Pt is minimally ambulatory. Venous duplex demonstrating acute deep vein thrombosis involving the right saphenofemoral junction, common femoral vein, profunda vein, femoral vein, and popliteal vein.Deep vein thrombosis involving the left posterior tibial vein. Vascular surgery consulted and recommend anticoagulation -Therapeutic Lovenox to bridge to coumadin -Will set up follow up for coumadin  Dehydration/Hyponatremia: Based on his age, weight, and Na+ level, the patient's free water deficit is calculated to be 4.2 L. His serum osmolality was calculated at 295. Serum osmolality 291, urine osmolality 628. Urinalysis showed glucose >1000, ketones 15, large leuks, negative protein and negative nitrites. Resolving. -Consistent carb diet  Hyperglycemia/DM2: Patient takes Metformin 500 mg po daily at home. Family states that he hasn't been taking medications regularly. Patient's last HgbA1c was 8.1 on 03/19/2010. Hgb A1c 14. BG better controlled. -Sliding scale insulin -CBG 4 times daily    -lantus 15u daily -restart Metformin at home  HTN: Patient is on lisinopril 5 mg po daily at home. Normotensive here. -Will recommend to PCP switching lisinopril to ARB as patient has had cough in the past with an ACE inhibitor.  GERD: Patient is on protonix 40 mg at home.  -Continue protonix   HLD: Lipid panel on 10/29/2009 showed Cholesterol 160, TG 94, HDL 45, LDL 96. Repeat lipid panel on 01/30/2104 showed cholesterol 214, TG 103, HDL 33, LDL 160.  -Atorvastatin  daily  DVT/PE ppx: Lovenox, coumadin  Dispo: Likely home today  The patient does have a current PCP Romero Belling, MD) and does not need an [facility] hospital follow-up appointment after discharge.  The patient does not have transportation limitations that hinder transportation to clinic appointments.  .Services Needed at time of discharge: Y = Yes, Blank = No PT: Home health  OT:   RN:   Equipment: Hospital bed, wheelchair  Other:     LOS: 3 days   Victorino Dike  Isabella Bowens, MD 01/31/2014, 9:10 AM

## 2014-01-31 NOTE — Progress Notes (Signed)
  PROGRESS NOTE MEDICINE TEACHING ATTENDING   Day 3 of stay Patient name: Colin [name]   Medical record number: [id number] Date of birth: August 13, 1932    Met with patient's son-in-law and daughter. Discussed planning and appointments after discharge. Patient had an overnight episode of bradycardia, asymptomatic while sleeping. Has sinus arrhythmia on EKG with PVCs. Patient might need cardiology evaluation as outpatient - will suggest to new PCP.   I have discussed the care of this patient with my IM team residents. Please see the resident note for details.  Rachel, [place] 01/31/2014, 3:16 PM.

## 2014-01-31 NOTE — Progress Notes (Signed)
Inpatient Diabetes Program Recommendations  AACE/ADA: New Consensus Statement on Inpatient Glycemic Control (2013)  Target Ranges:  Prepandial:   less than 140 mg/dL      Peak postprandial:   less than 180 mg/dL (1-2 hours)      Critically ill patients:  140 - 180 mg/dL   Reason for Assessment:  Call received from MD regarding insulin teaching for patient's family. Note that yesterday, patient's son in-law was shown by bedside RN how to give insulin.  He was supposed to practice administering with supper last PM.  Discussed with RN, Delcine.  She states that she will allow them to administer insulin with lunch and also watch the diabetes videos prior to discharge.  Will be glad to assist as needed.   Thanks, Beryl Meager, RN, BC-ADM Inpatient Diabetes Coordinator Pager [phone]

## 2014-01-31 NOTE — Progress Notes (Signed)
Subjective: Colin [name] is a 80 YO man with a PMH of DM2, CAD with CABGx4 in 1997, HTN, stroke, and arthritis who was brought to the ED on Monday by his family for failure to thrive. He was found to have a small stroke as well as progressive cognitive decline. Overnight, the patient was bradycardic (44-53). Night team was called and repeat EKG was performed. On exam this morning, the patient continues to have blunted affect and difficulty responding to questions. He has no acute complaints, other than tenderness in his legs. He denies chest pain, SOB, and N/V. He did not respond to further questioning.  Objective: Vital signs in last 24 hours: Filed Vitals:   01/30/14 1659 01/30/14 2156 01/31/14 0622 01/31/14 0630  BP: 146/68 148/88 156/56   Pulse: 53 46 44 48  Temp: 98.5 F (36.9 C) 98.4 F (36.9 C) 98.1 F (36.7 C)   TempSrc: Oral Oral Oral   Resp: Height:      Weight:   84.5 kg (186 lb 4.6 oz)   SpO2: 99% 100% 100%    Weight change: 0.5 kg (1 lb 1.6 oz)  Intake/Output Summary (Last 24 hours) at 01/31/14 0907 Last data filed at 01/31/14 1610  Gross per 24 hour  Intake    240 ml  Output    950 ml  Net   -710 ml   BP 156/56  Pulse 48  Temp(Src) 98.1 F (36.7 C) (Oral)  Resp 16  Ht  (1.651 m)  Wt 84.5 kg (186 lb 4.6 oz)  BMI 31.00 kg/m2  SpO2 100% General appearance: fatigued, no distress and slowed mentation Head: Normocephalic, without obvious abnormality, atraumatic Lungs: clear to auscultation bilaterally Heart: regular rate and rhythm, S1, S2 normal, no murmur, click, rub or gallop Extremities: Marked tenderness to palpation R>L, 1+ edema in R leg, no edema in L leg, healing wound on R shin Neurologic: Mental status: alertness: lethargic, orientation: person, affect: blunted Lab Results: Basic Metabolic Panel:  Recent Labs Lab 01/30/14 0559 01/31/14 0750  NA 132* 134*  K 4.6 4.9  CL 98 100  CO2 22 21  GLUCOSE 226* 131*  BUN 31* 21    CREATININE 1.33 1.19  CALCIUM 8.5 8.6   Liver Function Tests:  Recent Labs Lab 01/28/14 1130  AST 24  ALT 23  ALKPHOS 68  BILITOT 0.5  PROT 8.3  ALBUMIN 3.3*    Recent Labs Lab 01/28/14 1130 01/29/14 0843  LIPASE 164* 54   CBC:  Recent Labs Lab 01/28/14 1130  01/31/14 0117 01/31/14 0750  WBC 7.4  < > 4.7 5.2  NEUTROABS 5.4  --   --   --   HGB 14.2  < > 12.5* 12.6*  HCT 40.3  < > 37.2* 36.4*  MCV 83.6  < > 85.1 84.7  PLT 157  < > 154 155  < > = values in this interval not displayed.  CBG:  Recent Labs Lab 01/29/14 2134 01/30/14 0818 01/30/14 1135 01/30/14 1650 01/30/14 2204 01/31/14 0755  GLUCAP 230* 196* 315* 131* 137* 140*   Hemoglobin A1C:  Recent Labs Lab 01/28/14 1918  HGBA1C 14.0*   Fasting Lipid Panel:  Recent Labs Lab 01/29/14 0100  CHOL 214*  HDL 33*  LDLCALC 160*  TRIG 103  CHOLHDL 6.5   Coagulation:  Recent Labs Lab 01/30/14 1558  LABPROT 14.5  INR 1.13   Misc. Labs: PT/INR, unfractionated heparin level pending  Medications: I have reviewed  the patient's current medications. Scheduled Meds: . antiseptic oral rinse  7 mL Mouth Rinse BID  . aspirin  325 mg Oral Daily  . atorvastatin  40 mg Oral q1800  . enoxaparin   Does not apply Once  . insulin aspart  0-15 Units Subcutaneous TID WC  . insulin aspart  0-5 Units Subcutaneous QHS  . insulin glargine  15 Units Subcutaneous Daily  . pantoprazole  40 mg Oral Q1200   Continuous Infusions:  PRN Meds:. Assessment/Plan: Principal Problem:   History of stroke Active Problems:   DIABETES MELLITUS, TYPE II   Dehydration   Hyponatremia   Dysphagia, unspecified(787.20)   Mild malnutrition   Weakness   Stroke  Acute and chronic stroke: No changes in cognitive or physical abilities appreciated today.  -Follow up with [facility] 2 months after discharge -Home health requested  DVT/PE: Patient has been receiving Lovenox 40 mg SQ daily since  admission. On admission, his RLE had 1+ edema with a healing scab on his shin. Patient reports tenderness in his legs bilaterally that the family describes as chronic muscle pain. Lower extremity venous duplex scan yesterday revealed: Right DVT noted in the saphenofemoral junction, common femoral vein, profunda vein, femoral vein, and popliteal vein. No evidence of superficial thrombosis. No Baker's cyst. Left: DVT noted in the posterior tibial vein. No evidence of superficial thrombosis. No Baker's cyst.  DVT likely provoked due to recent trauma to the shin after a fall and his chronic immobility. Vascular surgery consulted with the patient yesterday and recommended long-term anticoagulation rather than IVC filter placement. Communicated this to the family and they are willing to start coumadin at home with the patient. Will be seen by Dr. Alexandria Lodge on Tuesday after their clinic appointment at 9:15. -Lovenox per pharmacy -Coumadin per pharmacy, likely 5 mg -ASA 325 mg PO qd  -F/U with Dr. Alexandria Lodge on 9/8  -Will continue [facility]ag checks at [facility]  Bradycardia: Patient was bradycardic overnight while sleeping, ranging from 44-53. Repeat EKG was performed this morning, showing no changes from prior EKG. Bradycardia likely is normal occurrence while patient is sleeping. No further workup needed at this time.   Memory loss:  Attempted the MMSE with the patient yesterday afternoon. Patient was only able to answer the orientation questions before becoming too tired/sleepy/uncooperative. He got 2/10 questions correct, indicating significant cognitive decline. Mini-cog not attempted before discharge because the patient was unresponsive. Although the family states that his behavior/intellect has worsened only over the past 2 months, it is likely that the patient's cognitive decline began long before 2 months ago and the family was able to compensate for his losses. We could  consider starting a cholinesterase inhibitor, such as donepezil, rivastigmine, or galantimine, or memantine, which is an NMDA antagonist.  -Follow as outpatient with PCP or with neurology  Depression:  Screened the patient for depression yesterday with SIG E CAPS questions. Patient reported that he has been sleeping more, that he is losing interest in things that used to interest him, that he feels guilty, that he lacks energy, that he has trouble concentrating, that his cognition is reduced, and that his appetite has been reduced. He denied anxiety and suicidal or homicidal ideations. Plan to discuss starting treatment vs conservative therapy for patient and family.  -Recommend outpatient follow up with PCP  Dehydration/Hyponatremia: On admission, serum Na+ was 127 and serum glucose was 421. Patient's hyponatremia likely to be caused by his fluid deficit.  Na+ up to 134 this morning. -BMET Q6H  -Cardiac monitoring  -Daily weights  -Routine I/O's   Hyperglycemia/DM2: Patient occasionally takes Metformin 500 mg po daily at home. CBG on admission was 421 - most recent was 140 at 0755on 01/31/14. Recent HbA1c was 14.0. The patient is has been non-compliant with his carb-modified diet while in the hospital as well - his family brought him fried chicken for dinner last night. Diabetic coordinator spoke at length with patient's family yesterday about using once-daily insulin injections combined with his oral medications to get better control of his glucose level. Family is willing to do the once daily insulin injections, but they request additional help with the insulin injections. Diabetes educator was notified and she will speak with the family prior to discharge. Patient's creatinine has improved to 1.19 today, so metformin can be restarted upon discharge as well.  -Lantus 15U qd at discharge -metformin 500 mg at discharge -Carb modified diet  -Sliding scale insulin-sensitive  -CBG 4 times daily    -Diabetes educator consult prior to discharge  HTN: Patient is hypertensive on admission 130s-180s/80s. Patient is on lisinopril 5 mg po daily at home, but has not been taking medications for approx 1 month. Resume HTN meds upon discharge with BP goal of 130/90. Patient reports an allergy to ramipril, so consider starting an ARB upon discharge. -HTN meds held due to stroke findings currently  -Follow up with PCP about starting ARB  GERD: Patient is on protonix 40 mg at home.  -Continue protonix   HLD: Lipid panel on 10/29/2009 showed Cholesterol 160, TG 94, HDL 45, LDL 96. Repeat lipid panel on 01/30/2104 showed cholesterol 214, TG 103, HDL 33, LDL 160.  -Atorvastatin 40 mg PO initiated   Dispo: Discharge to home today.  The patient does not have a current PCP (planning on F/U with [facility]) and does need an [facility] hospital follow-up appointment after discharge.   The patient does not have transportation limitations that hinder transportation to clinic appointments.  This is a Psychologist, occupational Note.  The care of the patient was discussed with Dr. Isabella Bowens and the assessment and plan formulated with their assistance.  Please see their attached note for official documentation of the daily encounter.   LOS: 3 days   Alford Highland, Med Student 01/31/2014, 9:07 AM

## 2014-01-31 NOTE — Progress Notes (Addendum)
Pt's HR=44;Taken manually 48 .;Dr.NGO made aware.Will continue to monitor pt.

## 2014-01-31 NOTE — Progress Notes (Signed)
Discharge Note:  Patient caregiver given discharge instructions.  Patient caregiver verbalized understanding.  Patient caregiver was able to demonstrate drawing up insulin and giving insulin injection.  Patient caregiver watched videos on coumadin, and diabetes. Patient PIV removed and area clean and dry.    Discharge medications:    Medication List    STOP taking these medications       lisinopril 5 MG tablet  Commonly known as:  PRINIVIL,ZESTRIL     lovastatin 20 MG tablet  Commonly known as:  MEVACOR      TAKE these medications       atorvastatin 40 MG tablet  Commonly known as:  LIPITOR  Take 1 tablet (40 mg total) by mouth daily at 6 PM.     fondaparinux 7.5 MG/0.6ML Soln injection  Commonly known as:  ARIXTRA  Inject 0.6 mLs (7.5 mg total) into the skin daily.  Start taking on:  02/01/2014     insulin glargine 100 UNIT/ML injection  Commonly known as:  LANTUS  Inject 0.15 mLs (15 Units total) into the skin daily.     metFORMIN 500 MG tablet  Commonly known as:  GLUCOPHAGE  Take 500 mg by mouth daily.     omeprazole 20 MG capsule  Commonly known as:  PRILOSEC  Take 20 mg by mouth daily.     warfarin 5 MG tablet  Commonly known as:  COUMADIN  Take 1 tablet (5 mg total) by mouth daily.  Start taking on:  02/01/2014      patient escorted via wheelchair with NT Duwayne Heck and Son to son's car.   Danne Harbor, RN on 01/31/14 at [phone]

## 2014-01-31 NOTE — Progress Notes (Signed)
  I have seen and examined the patient, and reviewed the daily progress note by Alford Highland, MS3 and discussed the care of the patient with them. Please see my progress note from 01/31/2014 for further details regarding assessment and plan.    Signed:  Griffin Basil, MD 01/31/2014, 6:43 AM

## 2014-01-31 NOTE — Discharge Instructions (Addendum)
Stroke Prevention Some health problems and behaviors may make it more likely for you to have a stroke. Below are ways to lessen your risk of having a stroke.   Be active for at least 30 minutes on most or all days.  Do not smoke. Try not to be around others who smoke.  Do not drink too much alcohol.  Do not have more than 2 drinks a day if you are a man.  Do not have more than 1 drink a day if you are a woman and are not pregnant.  Eat healthy foods, such as fruits and vegetables. If you were put on a specific diet, follow the diet as told.  Keep your cholesterol levels under control through diet and medicines. Look for foods that are low in saturated fat, trans fat, cholesterol, and are high in fiber.  If you have diabetes, follow all diet plans and take your medicine as told.  If you have high blood pressure (hypertension), follow all diet plans and take your medicine as told.  Keep a healthy weight. Eat foods that are low in calories, salt, saturated fat, trans fat, and cholesterol.  Do not take drugs.  Avoid birth control pills, if this applies. Talk to your doctor about the risks of taking birth control pills.  Talk to your doctor if you have sleep problems (sleep apnea).  Take all medicine as told by your doctor.  You may be told to take aspirin or blood thinner medicine. Take this medicine as told by your doctor.  Understand your medicine instructions.  Make sure any other conditions you have are being taken care of. GET HELP RIGHT AWAY IF:  You suddenly lose feeling (you feel numb) or have weakness in your face, arm, or leg.  Your face or eyelid hangs down to one side.  You suddenly feel confused.  You have trouble talking (aphasia) or understanding what people are saying.  You suddenly have trouble seeing in one or both eyes.  You suddenly have trouble walking.  You are dizzy.  You lose your balance or your movements are clumsy (uncoordinated).  You  suddenly have a very bad headache and you do not know the cause.  You have new chest pain.  Your heart feels like it is fluttering or skipping a beat (irregular heartbeat). Do not wait to see if the symptoms above go away. Get help right away. Call your local emergency services (911 in [place] Do not drive yourself to the hospital. Document Released: 11/16/2011 Document Revised: 10/01/2013 Document Reviewed: 11/17/2012 [facility] Patient Information 2015 [place], [place]. This information is not intended to replace advice given to you by your health care provider. Make sure you discuss any questions you have with your health care provider.   Warfarin: What You Need to Know Warfarin is an anticoagulant. Anticoagulants help prevent the formation of blood clots. They also help stop the growth of blood clots. Warfarin is sometimes referred to as a "blood thinner."  Normally, when body tissues are cut or damaged, the blood clots in order to prevent blood loss. Sometimes clots form inside your blood vessels and obstruct the flow of blood through your circulatory system (thrombosis). These clots may travel through your bloodstream and become lodged in smaller blood vessels in your brain, which can cause a stroke, or in your lungs (pulmonary embolism). WHO SHOULD USE WARFARIN? Warfarin is prescribed for people at risk of developing harmful blood clots:  People with surgically implanted mechanical heart valves, irregular  heart rhythms called atrial fibrillation, and certain clotting disorders.  People who have developed harmful blood clotting in the past, including those who have had a stroke or a pulmonary embolism, or thrombosis in their legs (deep vein thrombosis [DVT]).  People with an existing blood clot, such as a pulmonary embolism. WARFARIN DOSING Warfarin tablets come in different strengths. Each tablet strength is a different color, with the amount of warfarin (in milligrams) clearly printed on  the tablet. If the color of your tablet is different than usual when you receive a new prescription, report it immediately to your pharmacist or health care provider. WARFARIN MONITORING The goal of warfarin therapy is to lessen the clotting tendency of blood but not prevent clotting completely. Your health care provider will monitor the anticoagulation effect of warfarin closely and adjust your dose as needed. For your safety, blood tests called prothrombin time (PT) or international normalized ratio (INR) are used to measure the effects of warfarin. Both of these tests can be done with a finger stick or a blood draw. The longer it takes the blood to clot, the higher the PT or INR. Your health care provider will inform you of your "target" PT or INR range. If, at any time, your PT or INR is above the target range, there is a risk of bleeding. If your PT or INR is below the target range, there is a risk of clotting. Whether you are started on warfarin while you are in the hospital or in your health care provider's office, you will need to have your PT or INR checked within one week of starting the medicine. Initially, some people are asked to have their PT or INR checked as much as twice a week. Once you are on a stable maintenance dose, the PT or INR is checked less often, usually once every 2 to 4 weeks. The warfarin dose may be adjusted if the PT or INR is not within the target range. It is important to keep all laboratory and health care provider follow-up appointments. Not keeping appointments could result in a chronic or permanent injury, pain, or disability because warfarin is a medicine that requires close monitoring. WHAT ARE THE SIDE EFFECTS OF WARFARIN?  Too much warfarin can cause bleeding (hemorrhage) from any part of the body. This may include bleeding from the gums, blood in the urine, bloody or dark stools, a nosebleed that is not easily stopped, coughing up blood, or vomiting blood.  Too  little warfarin can increase the risk of blood clots.  Too little or too much warfarin can also increase the risk of a stroke.  Warfarin use may cause a skin rash or irritation, an unusual fever, continual nausea or stomach upset, or severe pain in your joints or back. SPECIAL PRECAUTIONS WHILE TAKING WARFARIN Warfarin should be taken exactly as directed. It is very important to take warfarin as directed since bleeding or blood clots could result in chronic or permanent injury, pain, or disability.  Take your medicine at the same time every day. If you forget to take your dose, you can take it if it is within 6 hours of when it was due.  Do not change the dose of warfarin on your own to make up for missed or extra doses.  If you miss more than 2 doses in a row, you should contact your health care provider for advice. Avoid situations that cause bleeding. You may have a tendency to bleed more easily than usual while  taking warfarin. The following actions can limit bleeding:  Using a softer toothbrush.  Flossing with waxed floss rather than unwaxed floss.  Shaving with an Neurosurgeon rather than a blade.  Limiting the use of sharp objects.  Avoiding potentially harmful activities, such as contact sports. Warfarin and Pregnancy or Breastfeeding  Warfarin is not advised during the first trimester of pregnancy due to an increased risk of birth defects. In certain situations, a woman may take warfarin after her first trimester of pregnancy. A woman who becomes pregnant or plans to become pregnant while taking warfarin should notify her health care provider immediately.  Although warfarin does not pass into breast milk, a woman who wishes to breastfeed while taking warfarin should also consult with her health care provider. Alcohol, Smoking, and Illicit Drug Use  Alcohol affects how warfarin works in the body. It is best to avoid alcoholic drinks or consume very small amounts while taking  warfarin. In general, alcohol intake should be limited to 1 oz (30 mL) of liquor, 6 oz (180 mL) of wine, or 12 oz (360 mL) of beer each day. Notify your health care provider if you change your alcohol intake.  Smoking affects how warfarin works. It is best to avoid smoking while taking warfarin. Notify your health care provider if you change your smoking habits.  It is best to avoid all illicit drugs while taking warfarin since there are few studies that show how warfarin interacts with these drugs. Other Medicines and Dietary Supplements Many prescription and over-the-counter medicines can interfere with warfarin. Be sure all of your health care providers know you are taking warfarin. Notify your health care provider who prescribed warfarin for you or your pharmacist before starting or stopping any new medicines, including over-the-counter vitamins, dietary supplements, and pain medicines. Your warfarin dose may need to be adjusted. Some common over-the-counter medicines that may increase the risk of bleeding while taking warfarin include:   Acetaminophen.  Aspirin.  Nonsteroidal anti-inflammatory medicines (NSAIDs), such as ibuprofen or naproxen.  Vitamin E. Dietary Considerations  Foods that have moderate or high amounts of vitamin K can interfere with warfarin. Avoid major changes in your diet or notify your health care provider before changing your diet. Eat a consistent amount of foods that have moderate or high amounts of vitamin K. Eating less foods containing vitamin K can increase the risk of bleeding. Eating more foods containing vitamin K can increase the risk of blood clots. Additional questions about dietary considerations can be discussed with a dietitian. Foods that are very high in vitamin K:  Greens, such as Swiss chard and beet, collard, mustard, or turnip greens (fresh or frozen, cooked).  Kale (fresh or frozen, cooked).  Parsley (raw).  Spinach (cooked). Foods that are  high in vitamin K:  Asparagus (frozen, cooked).  Beans, green (frozen, cooked).  Broccoli.  Bok choy (cooked).  Brussels sprouts (fresh or frozen, cooked).  Cabbage (cooked).   Coleslaw. Foods that are moderately high in vitamin K:  Blueberries.  Black-eyed peas.  Endive (raw).  Green leaf lettuce (raw).  Green scallions (raw).  Kale (raw).  Okra (frozen, cooked).  Plantains (fried).  Romaine lettuce (raw).  Sauerkraut (canned).  Spinach (raw). CALL YOUR CLINIC OR HEALTH CARE PROVIDER IF YOU:  Plan to have any surgery or procedure.  Feel sick, especially if you have diarrhea or vomiting.  Experience or anticipate any major changes in your diet.  Start or stop a prescription or over-the-counter medicine.  Become,  plan to become, or think you may be pregnant.  Are having heavier than usual menstrual periods.  Have had a fall, accident, or any symptoms of bleeding or unusual bruising.  Develop an unusual fever. CALL 911 IN THE [place] OR GO TO THE EMERGENCY DEPARTMENT IF YOU:   Think you may be having an allergic reaction to warfarin. The signs of an allergic reaction could include itching, rash, hives, swelling, chest tightness, or trouble breathing.  See signs of blood in your urine. The signs could include reddish, pinkish, or tea-colored urine.  See signs of blood in your stools. The signs could include bright red or black stools.  Vomit or cough up blood. In these instances, the blood could have either a bright red or a "coffee-grounds" appearance.  Have bleeding that will not stop after applying pressure for 30 minutes such as cuts, nosebleeds, or other injuries.  Have severe pain in your joints or back.  Have a new and severe headache.  Have sudden weakness or numbness of your face, arm, or leg, especially on one side of your body.  Have sudden confusion or trouble understanding.  Have sudden trouble seeing in one or both eyes.  Have  sudden trouble walking, dizziness, loss of balance, or coordination.  Have trouble speaking or understanding (aphasia). Document Released: 05/17/2005 Document Revised: 10/01/2013 Document Reviewed: 11/10/2012 [facility] Patient Information 2015 [place], [place]. This information is not intended to replace advice given to you by your health care provider. Make sure you discuss any questions you have with your health care provider.  Information on my medicine - Coumadin   (Warfarin)  This medication education was reviewed with me or my healthcare representative as part of my discharge preparation.  The pharmacist that spoke with me during my hospital stay was:  Arman Filter, [facility]  Why was Coumadin prescribed for you? Coumadin was prescribed for you because you have a blood clot or a medical condition that can cause an increased risk of forming blood clots. Blood clots can cause serious health problems by blocking the flow of blood to the heart, lung, or brain. Coumadin can prevent harmful blood clots from forming. As a reminder your indication for Coumadin is:   Select from menu  What test will check on my response to Coumadin? While on Coumadin (warfarin) you will need to have an INR test regularly to ensure that your dose is keeping you in the desired range. The INR (international normalized ratio) number is calculated from the result of the laboratory test called prothrombin time (PT).  If an INR APPOINTMENT HAS NOT ALREADY BEEN MADE FOR YOU please schedule an appointment to have this lab work done by your health care provider within 7 days. Your INR goal is usually a number between:  2 to 3 or your provider may give you a more narrow range like 2-2.5.  Ask your health care provider during an office visit what your goal INR is.  What  do you need to  know  About  COUMADIN? Take Coumadin (warfarin) exactly as prescribed by your healthcare provider about the same time each day.  DO NOT stop  taking without talking to the doctor who prescribed the medication.  Stopping without other blood clot prevention medication to take the place of Coumadin may increase your risk of developing a new clot or stroke.  Get refills before you run out.  What do you do if you miss a dose? If you miss a dose, take  it as soon as you remember on the same day then continue your regularly scheduled regimen the next day.  Do not take two doses of Coumadin at the same time.  Important Safety Information A possible side effect of Coumadin (Warfarin) is an increased risk of bleeding. You should call your healthcare provider right away if you experience any of the following:   Bleeding from an injury or your nose that does not stop.   Unusual colored urine (red or dark brown) or unusual colored stools (red or black).   Unusual bruising for unknown reasons.   A serious fall or if you hit your head (even if there is no bleeding).  Some foods or medicines interact with Coumadin (warfarin) and might alter your response to warfarin. To help avoid this:   Eat a balanced diet, maintaining a consistent amount of Vitamin K.   Notify your provider about major diet changes you plan to make.   Avoid alcohol or limit your intake to 1 drink for women and 2 drinks for men per day. (1 drink is 5 oz. wine, 12 oz. beer, or 1.5 oz. liquor.)  Make sure that ANY health care provider who prescribes medication for you knows that you are taking Coumadin (warfarin).  Also make sure the healthcare provider who is monitoring your Coumadin knows when you have started a new medication including herbals and non-prescription products.  Coumadin (Warfarin)  Major Drug Interactions  Increased Warfarin Effect Decreased Warfarin Effect  Alcohol (large quantities) Antibiotics (esp. Septra/Bactrim, Flagyl, Cipro) Amiodarone (Cordarone) Aspirin (ASA) Cimetidine (Tagamet) Megestrol (Megace) NSAIDs (ibuprofen, naproxen, etc.) Piroxicam  (Feldene) Propafenone (Rythmol SR) Propranolol (Inderal) Isoniazid (INH) Posaconazole (Noxafil) Barbiturates (Phenobarbital) Carbamazepine (Tegretol) Chlordiazepoxide (Librium) Cholestyramine (Questran) Griseofulvin Oral Contraceptives Rifampin Sucralfate (Carafate) Vitamin K   Coumadin (Warfarin) Major Herbal Interactions  Increased Warfarin Effect Decreased Warfarin Effect  Garlic Ginseng Ginkgo biloba Coenzyme Q10 Green tea St. Johns wort    Coumadin (Warfarin) FOOD Interactions  Eat a consistent number of servings per week of foods HIGH in Vitamin K (1 serving =  cup)  Collards (cooked, or boiled & drained) Kale (cooked, or boiled & drained) Mustard greens (cooked, or boiled & drained) Parsley *serving size only =  cup Spinach (cooked, or boiled & drained) Swiss chard (cooked, or boiled & drained) Turnip greens (cooked, or boiled & drained)  Eat a consistent number of servings per week of foods MEDIUM-HIGH in Vitamin K (1 serving = 1 cup)  Asparagus (cooked, or boiled & drained) Broccoli (cooked, boiled & drained, or raw & chopped) Brussel sprouts (cooked, or boiled & drained) *serving size only =  cup Lettuce, raw (green leaf, endive, romaine) Spinach, raw Turnip greens, raw & chopped   These websites have more information on Coumadin (warfarin):  http://www.king-russell.com/; https://www.hines.net/;

## 2014-01-31 NOTE — Progress Notes (Signed)
ANTICOAGULATION CONSULT NOTE - Follow Up Consult  Pharmacy Consult for heparin  -DC heparin and start Lovenox SQ and Coumadin Indication: DVT bilateral LLE  Labs:  Recent Labs  01/30/14 0046 01/30/14 0559  01/30/14 1558 01/31/14 0117 01/31/14 0750  HGB  --   --   < > 11.7* 12.5* 12.6*  HCT  --   --   --  33.9* 37.2* 36.4*  PLT  --   --   --  157 154 155  APTT  --   --   --  32  --   --   LABPROT  --   --   --  14.5  --  14.7  INR  --   --   --  1.13  --  1.15  HEPARINUNFRC  --   --   --   --  1.10* 0.63  CREATININE 1.46* 1.33  --   --   --  1.19  < > = values in this interval not displayed.   Assessment: 80yo male with acute bilateral LLE DVT.  To change from IV heparin drip to SQ lovenox and start Coumadin.  Heparin level this AM = 0.63 (therapeutic) and INR = 1.15. CBC stable, hgb 12.6, pltc 155K. SCr 1.19, CrCl ~ 50 ml/min.  Coumadin point score = 7points   Goal of Therapy:  Heparin level 0.3-0.7 units/ml   Plan:  DC IV heparin drip.  One hour after heparin discontinued, give Lovenox 130 mg SQ q24h.  Coumadin 7.5 mg po today x1- okay to start now (none at 18:00 tonight) Daily INR .  CBC at least q72hr. Begin coumadin education -book and video ordered.  RN to teach pt's family how to administer SQ lovenox as outpatient.  Outpatient INR check at coumadin clinic by Hulen Luster, Pharm D on 02/05/14  Noah Delaine, RPh Clinical Pharmacist Pager: [phone] 01/31/2014,12:57 PM

## 2014-02-01 ENCOUNTER — Ambulatory Visit: Payer: Medicare PPO | Admitting: Endocrinology

## 2014-02-05 ENCOUNTER — Ambulatory Visit: Payer: Medicare PPO | Admitting: Internal Medicine

## 2014-02-13 ENCOUNTER — Emergency Department (HOSPITAL_COMMUNITY): Payer: Medicare PPO

## 2014-02-13 ENCOUNTER — Inpatient Hospital Stay (HOSPITAL_COMMUNITY): Payer: Medicare PPO

## 2014-02-13 ENCOUNTER — Encounter (HOSPITAL_COMMUNITY): Payer: Self-pay | Admitting: Emergency Medicine

## 2014-02-13 ENCOUNTER — Inpatient Hospital Stay (HOSPITAL_COMMUNITY)
Admission: EM | Admit: 2014-02-13 | Discharge: 2014-02-18 | DRG: 064 | Disposition: A | Discharge status: Skilled Nursing Facility | Payer: Medicare PPO | Attending: Internal Medicine | Admitting: Internal Medicine

## 2014-02-13 DIAGNOSIS — Z8673 Personal history of transient ischemic attack (TIA), and cerebral infarction without residual deficits: Secondary | ICD-10-CM

## 2014-02-13 DIAGNOSIS — R5381 Other malaise: Secondary | ICD-10-CM | POA: Diagnosis present

## 2014-02-13 DIAGNOSIS — F0390 Unspecified dementia without behavioral disturbance: Secondary | ICD-10-CM | POA: Diagnosis present

## 2014-02-13 DIAGNOSIS — F329 Major depressive disorder, single episode, unspecified: Secondary | ICD-10-CM | POA: Diagnosis present

## 2014-02-13 DIAGNOSIS — K219 Gastro-esophageal reflux disease without esophagitis: Secondary | ICD-10-CM | POA: Diagnosis present

## 2014-02-13 DIAGNOSIS — Z86718 Personal history of other venous thrombosis and embolism: Secondary | ICD-10-CM

## 2014-02-13 DIAGNOSIS — R1310 Dysphagia, unspecified: Secondary | ICD-10-CM

## 2014-02-13 DIAGNOSIS — E860 Dehydration: Secondary | ICD-10-CM

## 2014-02-13 DIAGNOSIS — G9340 Encephalopathy, unspecified: Secondary | ICD-10-CM

## 2014-02-13 DIAGNOSIS — I129 Hypertensive chronic kidney disease with stage 1 through stage 4 chronic kidney disease, or unspecified chronic kidney disease: Secondary | ICD-10-CM | POA: Diagnosis present

## 2014-02-13 DIAGNOSIS — E7800 Pure hypercholesterolemia, unspecified: Secondary | ICD-10-CM | POA: Diagnosis present

## 2014-02-13 DIAGNOSIS — N182 Chronic kidney disease, stage 2 (mild): Secondary | ICD-10-CM | POA: Diagnosis present

## 2014-02-13 DIAGNOSIS — G40909 Epilepsy, unspecified, not intractable, without status epilepticus: Secondary | ICD-10-CM

## 2014-02-13 DIAGNOSIS — I6350 Cerebral infarction due to unspecified occlusion or stenosis of unspecified cerebral artery: Principal | ICD-10-CM | POA: Diagnosis present

## 2014-02-13 DIAGNOSIS — E871 Hypo-osmolality and hyponatremia: Secondary | ICD-10-CM

## 2014-02-13 DIAGNOSIS — R569 Unspecified convulsions: Secondary | ICD-10-CM | POA: Diagnosis present

## 2014-02-13 DIAGNOSIS — R404 Transient alteration of awareness: Secondary | ICD-10-CM | POA: Diagnosis not present

## 2014-02-13 DIAGNOSIS — F3289 Other specified depressive episodes: Secondary | ICD-10-CM

## 2014-02-13 DIAGNOSIS — Z87891 Personal history of nicotine dependence: Secondary | ICD-10-CM

## 2014-02-13 DIAGNOSIS — E119 Type 2 diabetes mellitus without complications: Secondary | ICD-10-CM

## 2014-02-13 DIAGNOSIS — N390 Urinary tract infection, site not specified: Secondary | ICD-10-CM | POA: Diagnosis present

## 2014-02-13 DIAGNOSIS — Z79899 Other long term (current) drug therapy: Secondary | ICD-10-CM

## 2014-02-13 DIAGNOSIS — Z7901 Long term (current) use of anticoagulants: Secondary | ICD-10-CM

## 2014-02-13 DIAGNOSIS — R4701 Aphasia: Secondary | ICD-10-CM

## 2014-02-13 DIAGNOSIS — R4182 Altered mental status, unspecified: Secondary | ICD-10-CM

## 2014-02-13 DIAGNOSIS — N179 Acute kidney failure, unspecified: Secondary | ICD-10-CM | POA: Diagnosis present

## 2014-02-13 DIAGNOSIS — R29898 Other symptoms and signs involving the musculoskeletal system: Secondary | ICD-10-CM | POA: Diagnosis present

## 2014-02-13 DIAGNOSIS — Z794 Long term (current) use of insulin: Secondary | ICD-10-CM

## 2014-02-13 DIAGNOSIS — E441 Mild protein-calorie malnutrition: Secondary | ICD-10-CM | POA: Diagnosis present

## 2014-02-13 DIAGNOSIS — I639 Cerebral infarction, unspecified: Secondary | ICD-10-CM

## 2014-02-13 LAB — COMPREHENSIVE METABOLIC PANEL
ALK PHOS: 55 U/L (ref 39–117)
ALT: 22 U/L (ref 0–53)
AST: 30 U/L (ref 0–37)
Albumin: 3.2 g/dL — ABNORMAL LOW (ref 3.5–5.2)
Anion gap: 14 (ref 5–15)
BILIRUBIN TOTAL: 0.5 mg/dL (ref 0.3–1.2)
BUN: 25 mg/dL — ABNORMAL HIGH (ref 6–23)
CALCIUM: 9.4 mg/dL (ref 8.4–10.5)
CHLORIDE: 89 meq/L — AB (ref 96–112)
CO2: 26 meq/L (ref 19–32)
Creatinine, Ser: 1.77 mg/dL — ABNORMAL HIGH (ref 0.50–1.35)
GFR, EST AFRICAN AMERICAN: 40 mL/min — AB (ref >90)
GFR, EST NON AFRICAN AMERICAN: 35 mL/min — AB (ref >90)
GLUCOSE: 184 mg/dL — AB (ref 70–99)
Potassium: 4.2 mEq/L (ref 3.7–5.3)
SODIUM: 129 meq/L — AB (ref 137–147)
Total Protein: 8.1 g/dL (ref 6.0–8.3)

## 2014-02-13 LAB — URINALYSIS, ROUTINE W REFLEX MICROSCOPIC
Bilirubin Urine: NEGATIVE
Glucose, UA: 250 mg/dL — AB
Ketones, ur: 15 mg/dL — AB
Nitrite: NEGATIVE
Protein, ur: 100 mg/dL — AB
Specific Gravity, Urine: 1.019 (ref 1.005–1.030)
Urobilinogen, UA: 0.2 mg/dL (ref 0.0–1.0)
pH: 5.5 (ref 5.0–8.0)

## 2014-02-13 LAB — CBG MONITORING, ED
Glucose-Capillary: 178 mg/dL — ABNORMAL HIGH (ref 70–99)
Glucose-Capillary: 155 mg/dL — ABNORMAL HIGH (ref 70–99)

## 2014-02-13 LAB — BASIC METABOLIC PANEL
ANION GAP: 15 (ref 5–15)
BUN: 23 mg/dL (ref 6–23)
CHLORIDE: 94 meq/L — AB (ref 96–112)
CO2: 23 mEq/L (ref 19–32)
Calcium: 8.6 mg/dL (ref 8.4–10.5)
Creatinine, Ser: 1.47 mg/dL — ABNORMAL HIGH (ref 0.50–1.35)
GFR calc Af Amer: 51 mL/min — ABNORMAL LOW (ref >90)
GFR, EST NON AFRICAN AMERICAN: 44 mL/min — AB (ref >90)
Glucose, Bld: 158 mg/dL — ABNORMAL HIGH (ref 70–99)
POTASSIUM: 4.2 meq/L (ref 3.7–5.3)
Sodium: 132 mEq/L — ABNORMAL LOW (ref 137–147)

## 2014-02-13 LAB — URINE MICROSCOPIC-ADD ON

## 2014-02-13 LAB — DIFFERENTIAL
BASOS ABS: 0 10*3/uL (ref 0.0–0.1)
BASOS PCT: 0 % (ref 0–1)
EOS ABS: 0 10*3/uL (ref 0.0–0.7)
EOS PCT: 0 % (ref 0–5)
Lymphocytes Relative: 29 % (ref 12–46)
Lymphs Abs: 1.6 10*3/uL (ref 0.7–4.0)
MONOS PCT: 11 % (ref 3–12)
Monocytes Absolute: 0.6 10*3/uL (ref 0.1–1.0)
NEUTROS ABS: 3.3 10*3/uL (ref 1.7–7.7)
Neutrophils Relative %: 60 % (ref 43–77)

## 2014-02-13 LAB — GLUCOSE, CAPILLARY
Glucose-Capillary: 125 mg/dL — ABNORMAL HIGH (ref 70–99)
Glucose-Capillary: 138 mg/dL — ABNORMAL HIGH (ref 70–99)

## 2014-02-13 LAB — CBC
HCT: 35 % — ABNORMAL LOW (ref 39.0–52.0)
Hemoglobin: 11.9 g/dL — ABNORMAL LOW (ref 13.0–17.0)
MCH: 28.7 pg (ref 26.0–34.0)
MCHC: 34 g/dL (ref 30.0–36.0)
MCV: 84.3 fL (ref 78.0–100.0)
Platelets: 262 10*3/uL (ref 150–400)
RBC: 4.15 MIL/uL — ABNORMAL LOW (ref 4.22–5.81)
RDW: 13 % (ref 11.5–15.5)
WBC: 5.5 10*3/uL (ref 4.0–10.5)

## 2014-02-13 LAB — TROPONIN I: Troponin I: <0.3 ng/mL (ref <0.30)

## 2014-02-13 LAB — LACTIC ACID, PLASMA: Lactic Acid, Venous: 2.2 mmol/L (ref 0.5–2.2)

## 2014-02-13 LAB — PROTIME-INR
INR: 1.63 — ABNORMAL HIGH (ref 0.00–1.49)
PROTHROMBIN TIME: 19.3 s — AB (ref 11.6–15.2)

## 2014-02-13 MED ORDER — SODIUM CHLORIDE 0.9 % IJ SOLN
3.0000 mL | Freq: Two times a day (BID) | INTRAMUSCULAR | Status: DC
  Administered 2014-02-13 – 2014-02-17 (×6): 3 mL via INTRAVENOUS

## 2014-02-13 MED ORDER — INSULIN GLARGINE 100 UNIT/ML ~~LOC~~ SOLN
5.0000 [IU] | Freq: Every day | SUBCUTANEOUS | Status: DC
  Administered 2014-02-13 – 2014-02-16 (×4): 5 [IU] via SUBCUTANEOUS
  Filled 2014-02-13 (×6): qty 0.05

## 2014-02-13 MED ORDER — INSULIN ASPART 100 UNIT/ML ~~LOC~~ SOLN
0.0000 [IU] | Freq: Three times a day (TID) | SUBCUTANEOUS | Status: DC
Start: 2014-02-13 — End: 2014-02-18
  Administered 2014-02-16 (×3): 3 [IU] via SUBCUTANEOUS
  Administered 2014-02-17: 5 [IU] via SUBCUTANEOUS
  Administered 2014-02-17: 3 [IU] via SUBCUTANEOUS
  Administered 2014-02-17: 5 [IU] via SUBCUTANEOUS
  Administered 2014-02-18: 2 [IU] via SUBCUTANEOUS
  Administered 2014-02-18: 5 [IU] via SUBCUTANEOUS
  Administered 2014-02-18: 11 [IU] via SUBCUTANEOUS

## 2014-02-13 MED ORDER — SODIUM CHLORIDE 0.9 % IV SOLN
Freq: Once | INTRAVENOUS | Status: AC
  Administered 2014-02-13: 13:00:00 via INTRAVENOUS

## 2014-02-13 MED ORDER — SODIUM CHLORIDE 0.9 % IV BOLUS (SEPSIS)
1000.0000 mL | Freq: Once | INTRAVENOUS | Status: AC
  Administered 2014-02-13: 1000 mL via INTRAVENOUS

## 2014-02-13 MED ORDER — DEXTROSE 5 % IV SOLN
1.0000 g | Freq: Once | INTRAVENOUS | Status: AC
  Administered 2014-02-13: 1 g via INTRAVENOUS
  Filled 2014-02-13: qty 10

## 2014-02-13 MED ORDER — ASPIRIN 300 MG RE SUPP
300.0000 mg | Freq: Every day | RECTAL | Status: DC
  Administered 2014-02-13: 300 mg via RECTAL
  Filled 2014-02-13 (×2): qty 1

## 2014-02-13 MED ORDER — INSULIN GLARGINE 100 UNIT/ML ~~LOC~~ SOLN
15.0000 [IU] | Freq: Every day | SUBCUTANEOUS | Status: DC
  Filled 2014-02-13: qty 0.15

## 2014-02-13 MED ORDER — ENOXAPARIN SODIUM 150 MG/ML ~~LOC~~ SOLN
1.5000 mg/kg | SUBCUTANEOUS | Status: DC
  Administered 2014-02-13: 130 mg via SUBCUTANEOUS
  Filled 2014-02-13 (×3): qty 1

## 2014-02-13 MED ORDER — SODIUM CHLORIDE 0.9 % IV SOLN
INTRAVENOUS | Status: DC
  Administered 2014-02-13: 18:00:00 via INTRAVENOUS
  Administered 2014-02-14: 1000 mL via INTRAVENOUS

## 2014-02-13 MED ORDER — PANTOPRAZOLE SODIUM 40 MG IV SOLR
40.0000 mg | INTRAVENOUS | Status: DC
  Administered 2014-02-13 – 2014-02-15 (×2): 40 mg via INTRAVENOUS
  Filled 2014-02-13 (×4): qty 40

## 2014-02-13 NOTE — Progress Notes (Signed)
[facility]  Patient Status: Active (receiving services up to time of hospitalization)  [facility] is providing the following services: PT  If patient discharges after hours, please call [phone].   Colin [name] 02/13/2014, 5:31 PM

## 2014-02-13 NOTE — Progress Notes (Signed)
ANTICOAGULATION CONSULT NOTE - Initial Consult  Pharmacy Consult for lovenox Indication: DVT  Allergies  Allergen Reactions  . Ramipril     REACTION: Cough    Patient Measurements: Height: 5' 4.96" (165 cm) Weight: 186 lb 4.6 oz (84.5 kg) IBW/kg (Calculated) : 61.41   Vital Signs: Temp: 98.5 F (36.9 C) (09/16 1037) Temp src: Oral (09/16 1037) BP: 131/60 mmHg (09/16 1602) Pulse Rate: 77 (09/16 1415)  Labs:  Recent Labs  02/13/14 1115 02/13/14 1359  HGB 11.9*  --   HCT 35.0*  --   PLT 262  --   LABPROT  --  19.3*  INR  --  1.63*  CREATININE 1.77*  --     Estimated Creatinine Clearance: 33.8 ml/min (by C-G formula based on Cr of 1.77).   Medical History: Past Medical History  Diagnosis Date  . Hypertension   . Stroke   . Arthritis   . Diabetes mellitus without complication     insulin dependent  . Hyperglycemia     Medications:  Warfarin  daily  Assessment: 80 year old male with known DVT on coumadin. INR low at 1.6, patient unable to swallow currently. Orders received to start daily dose lovenox until able to take warfarin. CBC appears stable. Renal function is borderline (scr 1.6), will continue to follow need for adjustments.  Goal of Therapy:  INR 2-3 Monitor platelets by anticoagulation protocol: Yes   Plan:  Lovenox  sq q24 hours - first dose tonight CBC q72h  Sheppard Coil PharmD., BCPS Clinical Pharmacist Pager [phone] 02/13/2014 6:38 PM

## 2014-02-13 NOTE — ED Notes (Signed)
Pt reports that pt was admitted on 08/31 for the same symptoms.  Pt is not verbally responsive.  Pt's daughter reports that pt began having the same symptoms on Monday.  She states that pt's R arm was flaccid and he was not really responding.  Daughter reports he stopped talking yesterday.  Pt not answering questions.

## 2014-02-13 NOTE — ED Provider Notes (Signed)
CSN: [id number]     Arrival date & time 02/13/14  1030 History   First MD Initiated Contact with Patient 02/13/14 1058     Chief Complaint  Patient presents with  . Altered Mental Status     (Consider location/radiation/quality/duration/timing/severity/associated sxs/prior Treatment) Patient is a 80 y.o. male presenting with altered mental status. The history is provided by the patient. No language interpreter was used.  Altered Mental Status Presenting symptoms: behavior changes, lethargy and partial responsiveness   Severity:  Severe Associated symptoms comment:  Brought in by daughter who is home care giver for symptoms of lethargy, nonverbal, weakness that is generalized recurrent from recent previous admission. He was discharged home on 01/31/14 at his baseline condition per daughter which is described as requiring assistance with ambulation, verbal, pleasant with normal appetite. Monday (2 days ago) he started sleeping more, not responding to verbal or physical stimuli and not eating. Per daughter he did not have any pain complaints, has not had a definite fever, has had no vomiting or cough.    Past Medical History  Diagnosis Date  . Diabetes mellitus without complication   . Hypertension   . Stroke   . Arthritis    Past Surgical History  Procedure Laterality Date  . Cardiac surgery     No family history on file. History  Substance Use Topics  . Smoking status: Former Games developer  . Smokeless tobacco: Not on file  . Alcohol Use: No    Review of Systems  Unable to perform ROS: Mental status change      Allergies  Ramipril  Home Medications   Prior to Admission medications   Medication Sig Start Date End Date Taking? Authorizing Provider  atorvastatin (LIPITOR) 40 MG tablet Take 1 tablet (40 mg total) by mouth daily at 6 PM. 01/31/14   Griffin Basil, MD  fondaparinux (ARIXTRA) 7.5 MG/0.6ML SOLN injection Inject 0.6 mLs (7.5 mg total) into the skin daily. 02/01/14 02/05/14   Griffin Basil, MD  insulin glargine (LANTUS) 100 UNIT/ML injection Inject 0.15 mLs (15 Units total) into the skin daily. 01/31/14   Griffin Basil, MD  metFORMIN (GLUCOPHAGE) 500 MG tablet Take 500 mg by mouth daily. 12/05/13   Historical Provider, MD  omeprazole (PRILOSEC) 20 MG capsule Take 20 mg by mouth daily. 12/05/13   Historical Provider, MD  warfarin (COUMADIN) 5 MG tablet Take 1 tablet (5 mg total) by mouth daily. 02/01/14   Griffin Basil, MD   BP 113/83  Pulse 87  Temp(Src) 98.5 F (36.9 C) (Oral)  Resp 18  SpO2 96% Physical Exam  Constitutional: He appears well-developed and well-nourished. No distress.  HENT:  Head: Normocephalic and atraumatic.  Eyes: Conjunctivae are normal.  Pupils constricted bilaterally.  Neck: Normal range of motion. Neck supple.  Cardiovascular: Normal rate.   No murmur heard. Pulmonary/Chest: Effort normal. He has no wheezes. He has no rales.  Spontaneous respirations without sign of distress.   Abdominal: Soft. He exhibits no distension.  Neurological:  The patient is unconscious and does not respond to painful stimuli. Respirations are spontaneous. Infrequent movements that appear as intentional shift of position.   Skin: Skin is warm and dry.    ED Course  Procedures (including critical care time) Labs Review Labs Reviewed  CBC - Abnormal; Notable for the following:    RBC 4.15 (*)    Hemoglobin 11.9 (*)    HCT 35.0 (*)    All other components within normal limits  URINALYSIS, ROUTINE W REFLEX  MICROSCOPIC  COMPREHENSIVE METABOLIC PANEL  DIFFERENTIAL  CBG MONITORING, ED   Results for orders placed during the hospital encounter of 02/13/14  CBC      Result Value Ref Range   WBC 5.5  4.0 - 10.5 K/uL   RBC 4.15 (*) 4.22 - 5.81 MIL/uL   Hemoglobin 11.9 (*) 13.0 - 17.0 g/dL   HCT 54.0 (*) 98.1 - 19.1 %   MCV 84.3  78.0 - 100.0 fL   MCH 28.7  26.0 - 34.0 pg   MCHC 34.0  30.0 - 36.0 g/dL   RDW 47.8  29.5 - 62.1 %   Platelets 262  150 -  400 K/uL  URINALYSIS, ROUTINE W REFLEX MICROSCOPIC      Result Value Ref Range   Color, Urine AMBER (*) YELLOW   APPearance TURBID (*) CLEAR   Specific Gravity, Urine 1.019  1.005 - 1.030   pH 5.5  5.0 - 8.0   Glucose, UA 250 (*) NEGATIVE mg/dL   Hgb urine dipstick LARGE (*) NEGATIVE   Bilirubin Urine NEGATIVE  NEGATIVE   Ketones, ur 15 (*) NEGATIVE mg/dL   Protein, ur 308 (*) NEGATIVE mg/dL   Urobilinogen, UA 0.2  0.0 - 1.0 mg/dL   Nitrite NEGATIVE  NEGATIVE   Leukocytes, UA LARGE (*) NEGATIVE  DIFFERENTIAL      Result Value Ref Range   Neutrophils Relative % 60  43 - 77 %   Neutro Abs 3.3  1.7 - 7.7 K/uL   Lymphocytes Relative 29  12 - 46 %   Lymphs Abs 1.6  0.7 - 4.0 K/uL   Monocytes Relative 11  3 - 12 %   Monocytes Absolute 0.6  0.1 - 1.0 K/uL   Eosinophils Relative 0  0 - 5 %   Eosinophils Absolute 0.0  0.0 - 0.7 K/uL   Basophils Relative 0  0 - 1 %   Basophils Absolute 0.0  0.0 - 0.1 K/uL  URINE MICROSCOPIC-ADD ON      Result Value Ref Range   Squamous Epithelial / LPF FEW (*) RARE   WBC, UA TOO NUMEROUS TO COUNT  <3 WBC/hpf   RBC / HPF TOO NUMEROUS TO COUNT  <3 RBC/hpf   Bacteria, UA FEW (*) RARE   Casts GRANULAR CAST (*) NEGATIVE  CBG MONITORING, ED      Result Value Ref Range   Glucose-Capillary 178 (*) 70 - 99 mg/dL   Comment 1 Notify RN      Imaging Review Ct Head Wo Contrast  02/13/2014   CLINICAL DATA:  Altered mental status  EXAM: CT HEAD WITHOUT CONTRAST  TECHNIQUE: Contiguous axial images were obtained from the base of the skull through the vertex without intravenous contrast.  COMPARISON:  01/28/2014  FINDINGS: The bony calvarium is intact. No gross soft tissue abnormality is noted. Diffuse atrophic changes are seen. No acute large territory infarct is identified. No hemorrhage or space-occupying mass lesion is noted.  IMPRESSION: Chronic changes without acute abnormality.   Electronically Signed   By: Alcide Clever M.D.   On: 02/13/2014 11:45     EKG  Interpretation None      MDM   Final diagnoses:  None    1. Altered mental status 2. Aphasia  Re-evaluation: patient is awake, makes eye contact; attempts to answer questions but answers intelligible; does not follow commands.   Admitted by Internal medicine clinic (discharge 9/3). Plan to admit to them for further inpatient care. MR ordered to be  followed by inpatient care team. VSS.     Arnoldo Hooker, PA-C 02/13/14 1306

## 2014-02-13 NOTE — ED Notes (Signed)
CBG 155 

## 2014-02-13 NOTE — Progress Notes (Signed)
Teaching services notified of pt tele change and EKG done per order.

## 2014-02-13 NOTE — Progress Notes (Signed)
During walking rounds patient found to be unresponsive to sternal rub, LED lights to eyes and mild stimulation. After 5 minutes patient responded to painful stimulation on lower extremities. Patient tracked nurse with eyes and utilized left hand with strong grip. Patient did nod head to some questions. Rapid response assessed patient, recommending nothing different at this time, Dr. Edgar Frisk notified, will come assess. Patient alert at this time. Will continue to monitor patient.

## 2014-02-13 NOTE — ED Provider Notes (Signed)
Medical screening examination/treatment/procedure(s) were performed by non-physician practitioner and as supervising physician I was immediately available for consultation/collaboration.   EKG Interpretation None       Delaynee Alred, MD 02/13/14 1711 

## 2014-02-13 NOTE — ED Notes (Signed)
Notified RN of CBG 178

## 2014-02-13 NOTE — ED Notes (Signed)
Patient transported to CT 

## 2014-02-13 NOTE — H&P (Signed)
Date: 02/13/2014               Patient Name:  Colin [name] MRN: [id number]  DOB: Jan 09, 1933 Age / Sex: 80 y.o., male   PCP: Romero Belling, MD         Medical Service: Internal Medicine Teaching Service         Attending Physician: Dr. Inez Catalina, MD    First Contact: Dr. Isabella Bowens Pager: [phone]  Second Contact: Dr. Shirlee Latch Pager: [phone]       After Hours (After 5p/  First Contact Pager: [phone]  weekends / holidays): Second Contact Pager: [phone]   Chief Complaint: AMS  History of Present Illness: Mr. Majette is a 80 year old man with history of DM2, HTN, recently hospitalized 8/31 to 9/3 for small acute stroke, cognitive decline, R LE DVT started on warfarin presenting nonverbal x 2 days and R arm weakness x 1 day. His daughter and daughter's fiance report his sister visited him Sunday and caused him to be depressed since he didn't want her to see him in his debilitated condition. On Tuesday, he had decreased PO intake and he could not move his R arm. His family notes chills, diarrhea on Saturday that has since resolved. He had shaking of his left arm today that lasted a few seconds- no incontinence or previous episodes. His family denies fever, nausea, vomiting, pain, hematochezia, melena. He has been swallowing slowly but family denies choking/coughing.  Previously on Prozac and was taken off a while ago. His family notes his depression was better controlled on Prozac. His family has been giving him all of his medications.  Meds: Current Facility-Administered Medications  Medication Dose Route Frequency Provider Last Rate Last Dose  . cefTRIAXone (ROCEPHIN) 1 g in dextrose 5 % 50 mL IVPB  1 g Intravenous Once Shari A Upstill, PA-C      . sodium chloride 0.9 % bolus 1,000 mL  1,000 mL Intravenous Once Arnoldo Hooker, PA-C       Current Outpatient Prescriptions  Medication Sig Dispense Refill  . atorvastatin (LIPITOR) 40 MG tablet Take 1 tablet (40 mg total) by mouth daily  at 6 PM.  30 tablet  0  . insulin glargine (LANTUS) 100 UNIT/ML injection Inject 0.15 mLs (15 Units total) into the skin daily.  10 mL  0  . metFORMIN (GLUCOPHAGE) 500 MG tablet Take 500 mg by mouth daily.      Marland Kitchen omeprazole (PRILOSEC) 20 MG capsule Take 20 mg by mouth daily.      Marland Kitchen warfarin (COUMADIN) 5 MG tablet Take 1 tablet (5 mg total) by mouth daily.  5 tablet  0  . fondaparinux (ARIXTRA) 7.5 MG/0.6ML SOLN injection Inject 0.6 mLs (7.5 mg total) into the skin daily.  3 mL  0    Allergies: Allergies as of 02/13/2014 - Review Complete 02/13/2014  Allergen Reaction Noted  . Ramipril     Past Medical History  Diagnosis Date  . Diabetes mellitus without complication   . Hypertension   . Stroke   . Arthritis    Past Surgical History  Procedure Laterality Date  . Cardiac surgery     No family history on file. History   Social History  . Marital Status: Married    Spouse Name: N/A    Number of Children: N/A  . Years of Education: N/A   Occupational History  . Not on file.   Social History Main Topics  . Smoking status: Former Games developer  .  Smokeless tobacco: Not on file  . Alcohol Use: No  . Drug Use: No  . Sexual Activity: Not on file   Other Topics Concern  . Not on file   Social History Narrative  . No narrative on file    Review of Systems: Constitutional: no fevers, +chills Eyes: no vision changes Ears, nose, mouth, throat, and face: no cough Respiratory: no shortness of breath Cardiovascular: no chest pain Gastrointestinal: no nausea/vomiting, no abdominal pain, no constipation, +diarrhea Genitourinary: no dysuria, no hematuria Integument: no rash Hematologic/lymphatic: no bleeding/bruising, no edema Musculoskeletal: no arthralgias, no myalgias Neurological: no paresthesias, +weakness  Could not obtain ROS from patient as he is nonverbal.  Physical Exam: Blood pressure 113/83, pulse 87, temperature 98.5 F (36.9 C), temperature source Oral, resp. rate  18, SpO2 96.00%. General Apperance: NAD Head: Normocephalic, atraumatic Eyes: PERRL, anicteric sclera Ears: Normal external ear canal Nose: Nares normal, septum midline, mucosa normal Throat: Lips, mucosa normal  Neck: Supple, trachea midline Back: No tenderness or bony abnormality  Lungs: Clear to auscultation bilaterally. No wheezes, rhonchi or rales. Breathing comfortably on RA Chest Wall: Nontender, no deformity Heart: Irregularly irregular, no murmur/rub/gallop Abdomen: Soft, nontender, nondistended, no rebound/guarding Extremities: Normal, atraumatic, warm and well perfused, no edema Pulses: 2+ throughout Skin: No rashes or lesions Neurologic: Alert. Nonverbal. Follows only some commands and poor effort. No gross facial droop. 3/5 LUE, flaccid RUE. Few spontaneous movements in LE.  Lab results: Basic Metabolic Panel:  Recent Labs  16/10/96 1115  NA 129*  K 4.2  CL 89*  CO2 26  GLUCOSE 184*  BUN 25*  CREATININE 1.77*  CALCIUM 9.4   Liver Function Tests:  Recent Labs  02/13/14 1115  AST 30  ALT 22  ALKPHOS 55  BILITOT 0.5  PROT 8.1  ALBUMIN 3.2*   CBC:  Recent Labs  02/13/14 1115  WBC 5.5  NEUTROABS 3.3  HGB 11.9*  HCT 35.0*  MCV 84.3  PLT 262   CBG:  Recent Labs  02/13/14 1208  GLUCAP 178*   Coagulation:  Recent Labs  02/13/14 1359  LABPROT 19.3*  INR 1.63*    Urinalysis:  Recent Labs  02/13/14 1220  COLORURINE AMBER*  LABSPEC 1.019  PHURINE 5.5  GLUCOSEU 250*  HGBUR LARGE*  BILIRUBINUR NEGATIVE  KETONESUR 15*  PROTEINUR 100*  UROBILINOGEN 0.2  NITRITE NEGATIVE  LEUKOCYTESUR LARGE*   Misc. Labs: Lactic acid 2.2  Imaging results:  Dg Chest 1 View  02/13/2014   CLINICAL DATA:  Altered mental status  EXAM: CHEST - 1 VIEW  COMPARISON:  Chest radiograph 02/13/2014.  FINDINGS: Stable cardiac and mediastinal contours status post median sternotomy and CABG procedure. No consolidative pulmonary opacities. Likely chronic left  posterior ninth rib fracture. Possible calcified right pulmonary nodule versus overlying ossific density within the lateral aspect of mid right lung. No pleural effusion or pneumothorax.  IMPRESSION: No acute cardiopulmonary process.   Electronically Signed   By: Annia Belt M.D.   On: 02/13/2014 15:58   Ct Head Wo Contrast  02/13/2014   CLINICAL DATA:  Altered mental status  EXAM: CT HEAD WITHOUT CONTRAST  TECHNIQUE: Contiguous axial images were obtained from the base of the skull through the vertex without intravenous contrast.  COMPARISON:  01/28/2014  FINDINGS: The bony calvarium is intact. No gross soft tissue abnormality is noted. Diffuse atrophic changes are seen. No acute large territory infarct is identified. No hemorrhage or space-occupying mass lesion is noted.  IMPRESSION: Chronic changes without acute  abnormality.   Electronically Signed   By: Alcide Clever M.D.   On: 02/13/2014 11:45   Mr Maxine Glenn Head Wo Contrast  02/13/2014   CLINICAL DATA:  Altered mental status. Aphasia. Hypertension diabetes and hypercholesterolemia. Dementia.  EXAM: MRI HEAD WITHOUT CONTRAST  MRA HEAD WITHOUT CONTRAST  TECHNIQUE: Multiplanar, multiecho pulse sequences of the brain and surrounding structures were obtained without intravenous contrast. Angiographic images of the head were obtained using MRA technique without contrast.  COMPARISON:  None.  MRI 01/30/2014  FINDINGS: MRI HEAD FINDINGS  Resolving areas of subacute infarct in the left splenium of the corpus callosum and left posterior body of the corpus callosum as noted previously.  New area of restricted diffusion in the left parietal lobe measuring under 1 cm consistent with acute infarct since the prior MRI. No other areas of acute infarct  Moderate to advanced atrophy. Advanced chronic ischemic changes throughout the white matter. Chronic left parietal infarct. Chronic ischemia in the pons and left cerebellum. Negative for hemorrhage. Negative for mass or edema   MRA HEAD FINDINGS  Both vertebral arteries are patent to the basilar. PICA patent bilaterally. Basilar widely patent. Superior cerebellar arteries are patent bilaterally. Moderate stenosis distal left posterior cerebral artery and severe stenosis distal right posterior cerebral artery  Atherosclerotic disease in the cavernous carotid artery with moderate stenosis on the left and mild stenosis on the right.  Moderate to severe stenosis proximal right A1 segment. Right anterior cerebral artery is patent. Right M1 segment is patent. Moderate disease in the distal right M1 branches  Moderate stenosis in the proximal left A1 segment. Left M1 segment shows mild narrowing proximally. Mild stenosis in the distal left middle cerebral artery branches  Negative for cerebral aneurysm  IMPRESSION: Sub cm area of acute infarct in the left parietal cortex.  Areas of subacute infarction in the left corpus callosum as noted on the recent MRI of 01-30-14  Pronounced atrophy and chronic ischemic changes.  Moderate intracranial atherosclerotic disease without large vessel occlusion.   Electronically Signed   By: Marlan Palau M.D.   On: 02/13/2014 15:46   Mr Brain Wo Contrast  02/13/2014   CLINICAL DATA:  Altered mental status. Aphasia. Hypertension diabetes and hypercholesterolemia. Dementia.  EXAM: MRI HEAD WITHOUT CONTRAST  MRA HEAD WITHOUT CONTRAST  TECHNIQUE: Multiplanar, multiecho pulse sequences of the brain and surrounding structures were obtained without intravenous contrast. Angiographic images of the head were obtained using MRA technique without contrast.  COMPARISON:  None.  MRI Jan 30, 2014  FINDINGS: MRI HEAD FINDINGS  Resolving areas of subacute infarct in the left splenium of the corpus callosum and left posterior body of the corpus callosum as noted previously.  New area of restricted diffusion in the left parietal lobe measuring under 1 cm consistent with acute infarct since the prior MRI. No other areas of acute  infarct  Moderate to advanced atrophy. Advanced chronic ischemic changes throughout the white matter. Chronic left parietal infarct. Chronic ischemia in the pons and left cerebellum. Negative for hemorrhage. Negative for mass or edema  MRA HEAD FINDINGS  Both vertebral arteries are patent to the basilar. PICA patent bilaterally. Basilar widely patent. Superior cerebellar arteries are patent bilaterally. Moderate stenosis distal left posterior cerebral artery and severe stenosis distal right posterior cerebral artery  Atherosclerotic disease in the cavernous carotid artery with moderate stenosis on the left and mild stenosis on the right.  Moderate to severe stenosis proximal right A1 segment. Right anterior cerebral artery is patent. Right  M1 segment is patent. Moderate disease in the distal right M1 branches  Moderate stenosis in the proximal left A1 segment. Left M1 segment shows mild narrowing proximally. Mild stenosis in the distal left middle cerebral artery branches  Negative for cerebral aneurysm  IMPRESSION: Sub cm area of acute infarct in the left parietal cortex.  Areas of subacute infarction in the left corpus callosum as noted on the recent MRI of 02-02-14  Pronounced atrophy and chronic ischemic changes.  Moderate intracranial atherosclerotic disease without large vessel occlusion.   Electronically Signed   By: Marlan Palau M.D.   On: 02/13/2014 15:46   Dg Chest Portable 1 View  02/13/2014   CLINICAL DATA:  Altered mental status  EXAM: PORTABLE CHEST - 1 VIEW  COMPARISON:  Chest radiograph 02/02/14  FINDINGS: Stable cardiac and mediastinal contours. Low lung volumes. Elevation right hemidiaphragm. Minimal heterogeneous opacities right lung base. No pleural effusion or pneumothorax. Regional skeleton is unremarkable.  IMPRESSION: Elevation right hemidiaphragm. Minimal heterogeneous opacities right lung base favored to represent atelectasis. Infection not excluded.   Electronically Signed   By:  Annia Belt M.D.   On: 02/13/2014 12:56    Other results: EKG: Sinus tachycardia with irregular rhythm, t wave inversion in V1, V3, V4, prolonged QTc 529  Assessment & Plan by Problem: Active Problems:   Altered mental status  Altered mental status: differential includes infection, electrolyte abn/metabolic, CVA, seizure, or dementia. He is afebrile with no leukocytosis - does not meet SIRS criteria. He does have large leukocytes, numerous WBC, few squamous epithelial cells and bacteria on UA. He received rocephin 1g in the ED. He is hyponatremic to 129 - likely 2/2 poor PO intake. Past CVA. CT brain with no acute changes. MRI brain with sub cm area of acute infarct in left parietal cortex. -Neuro consult. Spoke to Dr. Roseanne Reno who said he will evaluate pt. -Swallow eval -NS@125  for hyponatremia -  ASA per rectum daily -f/u urine culture -PT/OT  AKI on CKD stage 2: Cr 1.77 at admission. Baseline around 1.3. Likely pre-renal 2/2 decreased PO intake -Resuscitate as above  EKG changes: sinus arrythmia with nonspecific t wave changes -Troponin  DM2: On metformin  daily and lantus 15u daily. Last Hgb A1c 14 02-02-2014. -SSI -Lantus 5u QHS -Hold metformin  GERD -  protonix daily  DVT: -therapeutic lovenox  FEN: -NPO -NS@125   Dispo: Disposition is deferred at this time, awaiting improvement of current medical problems. Anticipated discharge in approximately 2 day(s).   The patient does have a current PCP Romero Belling, MD) and does not need an [facility] hospital follow-up appointment after discharge.  The patient does not have transportation limitations that hinder transportation to clinic appointments.  Signed: Griffin Basil, MD 02/13/2014, 1:46 PM

## 2014-02-14 ENCOUNTER — Observation Stay (HOSPITAL_COMMUNITY): Payer: Medicare PPO

## 2014-02-14 DIAGNOSIS — K219 Gastro-esophageal reflux disease without esophagitis: Secondary | ICD-10-CM

## 2014-02-14 DIAGNOSIS — E119 Type 2 diabetes mellitus without complications: Secondary | ICD-10-CM

## 2014-02-14 DIAGNOSIS — F3289 Other specified depressive episodes: Secondary | ICD-10-CM

## 2014-02-14 DIAGNOSIS — F329 Major depressive disorder, single episode, unspecified: Secondary | ICD-10-CM

## 2014-02-14 DIAGNOSIS — I6350 Cerebral infarction due to unspecified occlusion or stenosis of unspecified cerebral artery: Principal | ICD-10-CM

## 2014-02-14 DIAGNOSIS — N179 Acute kidney failure, unspecified: Secondary | ICD-10-CM

## 2014-02-14 DIAGNOSIS — E871 Hypo-osmolality and hyponatremia: Secondary | ICD-10-CM

## 2014-02-14 DIAGNOSIS — N182 Chronic kidney disease, stage 2 (mild): Secondary | ICD-10-CM

## 2014-02-14 DIAGNOSIS — R4182 Altered mental status, unspecified: Secondary | ICD-10-CM

## 2014-02-14 DIAGNOSIS — N390 Urinary tract infection, site not specified: Secondary | ICD-10-CM

## 2014-02-14 LAB — URINE CULTURE: Colony Count: 45000

## 2014-02-14 LAB — GLUCOSE, CAPILLARY
GLUCOSE-CAPILLARY: 70 mg/dL (ref 70–99)
Glucose-Capillary: 118 mg/dL — ABNORMAL HIGH (ref 70–99)
Glucose-Capillary: 100 mg/dL — ABNORMAL HIGH (ref 70–99)
Glucose-Capillary: 90 mg/dL (ref 70–99)
Glucose-Capillary: 141 mg/dL — ABNORMAL HIGH (ref 70–99)

## 2014-02-14 LAB — BASIC METABOLIC PANEL
Anion gap: 14 (ref 5–15)
BUN: 21 mg/dL (ref 6–23)
CALCIUM: 8.3 mg/dL — AB (ref 8.4–10.5)
CO2: 22 meq/L (ref 19–32)
CREATININE: 1.46 mg/dL — AB (ref 0.50–1.35)
Chloride: 100 mEq/L (ref 96–112)
GFR calc Af Amer: 51 mL/min — ABNORMAL LOW (ref >90)
GFR calc non Af Amer: 44 mL/min — ABNORMAL LOW (ref >90)
GLUCOSE: 105 mg/dL — AB (ref 70–99)
Potassium: 4.1 mEq/L (ref 3.7–5.3)
SODIUM: 136 meq/L — AB (ref 137–147)

## 2014-02-14 LAB — PROTIME-INR
INR: 1.68 — AB (ref 0.00–1.49)
Prothrombin Time: 19.8 seconds — ABNORMAL HIGH (ref 11.6–15.2)

## 2014-02-14 MED ORDER — DEXTROSE 50 % IV SOLN
INTRAVENOUS | Status: AC
  Filled 2014-02-14: qty 50

## 2014-02-14 MED ORDER — LORAZEPAM 2 MG/ML IJ SOLN
INTRAMUSCULAR | Status: AC
  Administered 2014-02-14: 2 mg
  Filled 2014-02-14: qty 1

## 2014-02-14 MED ORDER — WARFARIN SODIUM 7.5 MG PO TABS
7.5000 mg | ORAL_TABLET | Freq: Once | ORAL | Status: AC
  Administered 2014-02-15: 7.5 mg via ORAL
  Filled 2014-02-14: qty 1

## 2014-02-14 MED ORDER — LEVOFLOXACIN 750 MG PO TABS
750.0000 mg | ORAL_TABLET | ORAL | Status: DC
  Filled 2014-02-14: qty 1

## 2014-02-14 MED ORDER — SODIUM CHLORIDE 0.9 % IV SOLN
INTRAVENOUS | Status: DC
  Administered 2014-02-14: 12:00:00 via INTRAVENOUS

## 2014-02-14 MED ORDER — LEVETIRACETAM IN NACL 1000 MG/100ML IV SOLN
1000.0000 mg | Freq: Two times a day (BID) | INTRAVENOUS | Status: DC
  Administered 2014-02-15 – 2014-02-16 (×4): 1000 mg via INTRAVENOUS
  Filled 2014-02-14 (×5): qty 100

## 2014-02-14 MED ORDER — WARFARIN - PHARMACIST DOSING INPATIENT: Freq: Every day | Status: DC

## 2014-02-14 MED ORDER — DEXTROSE 50 % IV SOLN
1.0000 | Freq: Once | INTRAVENOUS | Status: AC
Start: 2014-02-14 — End: 2014-02-14
  Administered 2014-02-14: 50 mL via INTRAVENOUS

## 2014-02-14 MED ORDER — SODIUM CHLORIDE 0.9 % IV SOLN
INTRAVENOUS | Status: AC
  Administered 2014-02-14 – 2014-02-15 (×2): via INTRAVENOUS

## 2014-02-14 MED ORDER — LEVOFLOXACIN IN D5W 500 MG/100ML IV SOLN: 500.0000 mg | INTRAVENOUS | Status: DC

## 2014-02-14 MED ORDER — LEVOFLOXACIN IN D5W 750 MG/150ML IV SOLN
750.0000 mg | INTRAVENOUS | Status: DC
  Filled 2014-02-14: qty 150

## 2014-02-14 MED ORDER — LEVETIRACETAM IN NACL 1000 MG/100ML IV SOLN
1000.0000 mg | Freq: Once | INTRAVENOUS | Status: AC
  Administered 2014-02-14: 1000 mg via INTRAVENOUS
  Filled 2014-02-14: qty 100

## 2014-02-14 MED ORDER — DEXTROSE 50 % IV SOLN
1.0000 | Freq: Once | INTRAVENOUS | Status: DC
  Filled 2014-02-14: qty 50

## 2014-02-14 MED ORDER — LEVETIRACETAM 500 MG PO TABS
500.0000 mg | ORAL_TABLET | Freq: Two times a day (BID) | ORAL | Status: DC
  Filled 2014-02-14: qty 1

## 2014-02-14 MED ORDER — LEVETIRACETAM IN NACL 500 MG/100ML IV SOLN
500.0000 mg | Freq: Two times a day (BID) | INTRAVENOUS | Status: DC
  Administered 2014-02-14 (×2): 500 mg via INTRAVENOUS
  Filled 2014-02-14 (×3): qty 100

## 2014-02-14 MED ORDER — LEVOFLOXACIN IN D5W 250 MG/50ML IV SOLN
250.0000 mg | INTRAVENOUS | Status: DC
  Administered 2014-02-15: 250 mg via INTRAVENOUS
  Filled 2014-02-14 (×2): qty 50

## 2014-02-14 MED ORDER — FLUOXETINE HCL 10 MG PO CAPS
10.0000 mg | ORAL_CAPSULE | Freq: Every day | ORAL | Status: DC
  Administered 2014-02-15 – 2014-02-18 (×5): 10 mg via ORAL
  Filled 2014-02-14 (×5): qty 1

## 2014-02-14 MED ORDER — ASPIRIN EC 81 MG PO TBEC
81.0000 mg | DELAYED_RELEASE_TABLET | Freq: Every day | ORAL | Status: DC
  Administered 2014-02-15 – 2014-02-18 (×5): 81 mg via ORAL
  Filled 2014-02-14 (×5): qty 1

## 2014-02-14 MED ORDER — ENOXAPARIN SODIUM 150 MG/ML ~~LOC~~ SOLN
130.0000 mg | SUBCUTANEOUS | Status: DC
  Administered 2014-02-15: 130 mg via SUBCUTANEOUS
  Filled 2014-02-14 (×2): qty 1

## 2014-02-14 MED ORDER — LORAZEPAM 2 MG/ML IJ SOLN: 2.0000 mg | Freq: Once | INTRAMUSCULAR | Status: DC

## 2014-02-14 NOTE — Evaluation (Signed)
Clinical/Bedside Swallow Evaluation Patient Details  Name: Colin [name] MRN: [id number] Date of Birth: Jun 05, 1932  Today's Date: 02/14/2014 Time: 4098-1191 SLP Time Calculation (min): 20 min  Past Medical History:  Past Medical History  Diagnosis Date  . Hypertension   . Stroke   . Arthritis   . Diabetes mellitus without complication     insulin dependent  . Hyperglycemia    Past Surgical History:  Past Surgical History  Procedure Laterality Date  . Cardiac surgery    . Leg surgery Right    HPI:  Mr. Sones is a 80 year old man with history of DM2, HTN, recently hospitalized 8/31 to 9/3 for small acute stroke, cognitive decline, R LE DVT started on warfarin, who presents 9/16 nonverbal x 2 days and R arm weakness x 1 day. MRI reveals small, acute infarct in left parietal lobe. Work-up also underway for possible seizures. Pt was seen during most recent admission for swallowing and was recommended regular textures and thin liquids with full supervision for impulsivity. Most recent CXR is clear.   Assessment / Plan / Recommendation Clinical Impression  Pt was lethargic and slow to arouse for PO trials this morning with Max multimodal stimulation provided by SLP. Despite lethargy, trials of thin liquids appeared to occur with adequate automaticity. Suspect a mild delay in swallow initiation, with possible penetration of thin liquids via straw sips as evidenced by delayed, gentle coughing. SLP provided Mod multimodal cueing throughout intake for a slower rate and small sips to reduce the risk of aspiration. Pt had significantly prolonged mastication with solid POs with reduced posterior transit. Attempts at a liquid wash resulted in immediate reflexive coughing, likely due to decreased awareness/sustained attention making it difficult to manage mixed consistencies. Recommend to initiate Dys 2 textures and thin liquids via cup sips with full supervision to manage swallowing strategies.  SLP to follow for tolerance and solid advancement.    Aspiration Risk  Moderate    Diet Recommendation Dysphagia 2 (Fine chop);Thin liquid   Liquid Administration via: Cup;No straw Medication Administration: Whole meds with puree Supervision: Patient able to self feed;Full supervision/cueing for compensatory strategies Compensations: Slow rate;Small sips/bites Postural Changes and/or Swallow Maneuvers: Seated upright 90 degrees    Other  Recommendations Oral Care Recommendations: Oral care BID Other Recommendations: Other (Comment) (NO mixed consistencies)   Follow Up Recommendations  None    Frequency and Duration min 2x/week  2 weeks   Pertinent Vitals/Pain n/a    SLP Swallow Goals     Swallow Study Prior Functional Status       General Date of Onset: 02/13/14 HPI: Mr. Hurrell is a 80 year old man with history of DM2, HTN, recently hospitalized 8/31 to 9/3 for small acute stroke, cognitive decline, R LE DVT started on warfarin, who presents 9/16 nonverbal x 2 days and R arm weakness x 1 day. MRI reveals small, acute infarct in left parietal lobe. Work-up also underway for possible seizures. Pt was seen during most recent admission for swallowing and was recommended regular textures and thin liquids with full supervision for impulsivity. Most recent CXR is clear. Type of Study: Bedside swallow evaluation Previous Swallow Assessment: see HPI Diet Prior to this Study: NPO Temperature Spikes Noted: No Respiratory Status: Room air History of Recent Intubation: No Behavior/Cognition: Cooperative;Lethargic;Requires cueing;Other (comment) (flat affect) Self-Feeding Abilities: Needs assist Patient Positioning: Upright in bed Volitional Cough: Cognitively unable to elicit Volitional Swallow: Unable to elicit    Oral/Motor/Sensory Function Overall Oral Motor/Sensory Function: Other (comment) (pt not  following commands to complete)   Ice Chips Ice chips: Within functional  limits Presentation: Spoon   Thin Liquid Thin Liquid: Impaired Presentation: Cup;Straw Pharyngeal  Phase Impairments: Suspected delayed Swallow;Cough - Delayed;Cough - Immediate (delayed cough with straw, immediate with mixed consistency)    Nectar Thick Nectar Thick Liquid: Not tested   Honey Thick Honey Thick Liquid: Not tested   Puree Puree: Impaired Presentation: Self Fed;Spoon Oral Phase Impairments: Other (comment) (prolonged bolus formation)   Solid   GO Functional Assessment Tool Used: skilled clinical judgment Functional Limitations: Swallowing Swallow Current Status (W0981): At least 40 percent but less than 60 percent impaired, limited or restricted Swallow Goal Status [phone]): At least 20 percent but less than 40 percent impaired, limited or restricted  Solid: Impaired Presentation: Self Fed;Spoon Oral Phase Impairments: Impaired mastication Pharyngeal Phase Impairments: Cough - Immediate (with mixed consistency (liquid wash))         Maxcine Ham, M.A. CCC-SLP [phone]  Maxcine Ham 02/14/2014,11:44 AM

## 2014-02-14 NOTE — Progress Notes (Addendum)
  Date: 02/14/2014  Patient name: Masahiro Iglesia  Medical record number: [id number]  Date of birth: [birth year]   I have seen and evaluated Everitt Amber and discussed their care with the Residency Team.  Briefly, Mr. Fredin is a 80yo man with DM2, HTN who was recently hospitalized for stroke and RLE DVT.  He presented at this time for being nonverbal for 2 days and with some arm weakness.  He had also had decreased PO intake and decreased mood due to recently seeing a family member.  His family denied any swallowing difficulties, choking or coughing.  He was previously on prozac for depression.  When I saw the patient, he was sleeping.  He woke to voice, but would not open his eyes, my physical exam was limited.  On Dr. Joaquin Music exam, she did note poor effort and flaccid RUE with some strength in the LUE.  Will attempt PE later in the day when patient more alert.  On laboratory work, he does appear dehydrated with low Na and increased Cr from baseline with elevated BUN.  INR is subtherapeutic.  New MRI brain from this hospitalization did show a new sub cm area of infarct in left parietal cortex.  Neuro was consulted.  Overnight, patient with spell of decreased responsiveness.  UA shows apparent UTI.   Assessment and Plan: I have seen and evaluated the patient as outlined above. I agree with the formulated Assessment and Plan as detailed in the residents' admission note, with the following changes:   1. Stroke with AMS, waxing/waning responsiveness - Neuro consulted - Swallow evaluation - ASA per rectum - PT/OT in the near future, when he is able to participate.  - Given spells of decreased responsiveness, keppra was started and EEG planned.  (done at 1015 this AM and normal) - Consider restarting prozac for mood.   2. UTI - Started on levaquin - Await culture and sensitivities  3. AKI on CKD (baseline 1.3) - Pre renal and UTI - Treat UTI as above - He has received IVF with improvement in  numbers, now on NS 100cc/hr - Diet after swallowing evaluation as indicated  4. DM2 - As per Dr. Joaquin Music H&P - monitor CBGs and increase insulin as indicated - With worsening renal function, may not be able to start back metformin  5. H/O DVT - Therapeutic lovenox - Coumadin to be restarted in   Other issues per resident notes.   Inez Catalina, MD 9/17/20151:06 PM

## 2014-02-14 NOTE — Consult Note (Signed)
Neurology Consultation Reason for Consult: Recurrent stroke Referring Physician: Criselda Peaches, E  CC: Recurrent stroke  History is obtained from:Medical record  HPI: Colin [name] is a 80 y.o. male presented with decreased verbal output and right arm weakness for the past couple of days. He had an MRI which showed a very small new left parietal infarct. He has a subtherapeutic INR and was started on therapeutic Lovenox.  After admission, he had an episode of unresponsiveness of unclear etiology.  Since he was found unresponsive, he has had a slow, gradual improvement in his mental status.  ROS: A 14 point ROS was performed and is negative except as noted in the HPI.   Past Medical History  Diagnosis Date  . Hypertension   . Stroke   . Arthritis   . Diabetes mellitus without complication     insulin dependent  . Hyperglycemia     Family History: Not obtained due to aphasia  Social History: Tob: Former smoker  Exam: Current vital signs: BP 157/92  Pulse 64  Temp(Src) 98.2 F (36.8 C) (Axillary)  Resp 18  Ht 5' 4.96" (1.65 m)  Wt 84.5 kg (186 lb 4.6 oz)  BMI 31.04 kg/m2  SpO2 97% Vital signs in last 24 hours: Temp:  [98.2 F (36.8 C)-98.5 F (36.9 C)] 98.2 F (36.8 C) (09/16 2113) Pulse Rate:  [53-101] 64 (09/16 2113) Resp:  [13-18] 18 (09/16 2113) BP: (113-157)/(60-92) 157/92 mmHg (09/16 2113) SpO2:  [96 %-97 %] 97 % (09/16 2113) Weight:  [84.5 kg (186 lb 4.6 oz)] 84.5 kg (186 lb 4.6 oz) (09/16 1700)  General: In bed, NAD CV: Regular in rhythm Mental Status: Patient is awake, alert, oriented to person, unable to give place, or month. He repeats some phrases after the examiner. He is able to follow some commands. Cranial Nerves: II: Blinks to threat bilaterally. Pupils are equal, round, and reactive to light.  Discs are difficult to visualize. III,IV, VI: EOMI without ptosis or diploplia.  V: Facial sensation is symmetric to temperature VII: Facial movement is  notable for some right facial weakness VIII: hearing is intact to voice X, XI, XII: Unable to assess secondary to patient's altered mental status.  Motor: Tone is normal. Bulk is normal. He moves his left side well, and wiggles toes the right foot to command. He has a spastic paresis of his right arm. Sensory: Endorses decrease sensation on the right Deep Tendon Reflexes: 2+ and symmetric in the biceps and patellae.  Cerebellar: Does not cooperate Gait: Not tested secondary to patient safety concerns   I have reviewed labs in epic and the results pertinent to this consultation are: Hyponatremia  I have reviewed the images obtained: MRI brain-area of slightly increased signal in the cortex in the right parietal region.  Impression: 80 year old male with 2 episodes concerning for seizure in the setting of previous strokes. 1 with some arm shaking earlier today, the other with an episode of unresponsiveness with slow return to baseline this evening. Given his history of strokes, I would favor starting antiepileptic therapy with these 2 events.  The changes seen on MRI could represent a tiny new infarct in which case treatment would be resumption of his anticoagulation, then once indication for anticoagulation has finished antiplatelet therapy. He just had a full stroke workup and this does not need to be repeated.  I think his decline is multifactorial, and the tiny infarct seen on MRI could be contributing but I do not think it is responsible for  all the symptoms described.  Recommendations: 1) Keppra 500 mg twice a day 2) EEG 3) treatment of hyponatremia, aki per primary team 4) no need for repeat stroke workup.   Ritta Slot, MD [facility] [phone]  If 7pm- 7am, please page neurology on call as listed in AMION.

## 2014-02-14 NOTE — Progress Notes (Signed)
Routine adult EEG completed, results pending. 

## 2014-02-14 NOTE — Progress Notes (Signed)
Subjective: Colin [name] has waxing and waning moments of responsiveness. His family reports he is talking more to them. His nurse also reports that he was awake enough to participate in the swallow evaluation.  Objective: Vital signs in last 24 hours: Filed Vitals:   02/13/14 2113 02/14/14 0517 02/14/14 0942 02/14/14 1211  BP: 157/92 127/46 129/52 136/51  Pulse: 64 73 99 57  Temp: 98.2 F (36.8 C) 98.8 F (37.1 C) 98.4 F (36.9 C) 97.7 F (36.5 C)  TempSrc: Axillary Axillary Axillary Axillary  Resp: Height:      Weight:      SpO2: 97% 95% 98% 90%   Weight change:   Intake/Output Summary (Last 24 hours) at 02/14/14 1329 Last data filed at 02/14/14 0936  Gross per 24 hour  Intake      0 ml  Output    301 ml  Net   -301 ml   General Apperance: NAD  Head: Normocephalic, atraumatic  Eyes: PERRL, anicteric sclera  Ears: Normal external ear canal  Nose: Nares normal, septum midline, mucosa normal  Throat: Lips, mucosa normal  Neck: Supple, trachea midline  Back: No tenderness or bony abnormality  Lungs: Clear to auscultation bilaterally, no w/r/r Heart: Irregularly irregular, no m/r/g Chest Wall: Nontender, no deformities Abdomen: Soft, nontender, nondistended, no rebound/guarding  Extremities: Normal, atraumatic, warm and well perfused, no edema  Pulses: 2+ throughout  Skin: No rashes or lesions  Neurologic: Asleep. Does not open eyes to stimulation.  Lab Results: Basic Metabolic Panel:  Recent Labs Lab 02/13/14 1847 02/14/14 0545  NA 132* 136*  K 4.2 4.1  CL 94* 100  CO2 23 22  GLUCOSE 158* 105*  BUN 23 21  CREATININE 1.47* 1.46*  CALCIUM 8.6 8.3*   Liver Function Tests:  Recent Labs Lab 02/13/14 1115  AST 30  ALT 22  ALKPHOS 55  BILITOT 0.5  PROT 8.1  ALBUMIN 3.2*   CBC:  Recent Labs Lab 02/13/14 1115  WBC 5.5  NEUTROABS 3.3  HGB 11.9*  HCT 35.0*  MCV 84.3  PLT 262   Cardiac Enzymes:  Recent Labs Lab 02/13/14 1847   TROPONINI <0.30   CBG:  Recent Labs Lab 02/13/14 1600 02/13/14 2103 02/13/14 2324 02/14/14 0423 02/14/14 0813 02/14/14 1208  GLUCAP 155* 125* 138* 118* 100* 90   Coagulation:  Recent Labs Lab 02/13/14 1359  LABPROT 19.3*  INR 1.63*   Urinalysis:  Recent Labs Lab 02/13/14 1220  COLORURINE AMBER*  LABSPEC 1.019  PHURINE 5.5  GLUCOSEU 250*  HGBUR LARGE*  BILIRUBINUR NEGATIVE  KETONESUR 15*  PROTEINUR 100*  UROBILINOGEN 0.2  NITRITE NEGATIVE  LEUKOCYTESUR LARGE*   Micro Results: No results found for this or any previous visit (from the past 240 hour(s)). Studies/Results: Dg Chest 1 View  02/13/2014   CLINICAL DATA:  Altered mental status  EXAM: CHEST - 1 VIEW  COMPARISON:  Chest radiograph 02/13/2014.  FINDINGS: Stable cardiac and mediastinal contours status post median sternotomy and CABG procedure. No consolidative pulmonary opacities. Likely chronic left posterior ninth rib fracture. Possible calcified right pulmonary nodule versus overlying ossific density within the lateral aspect of mid right lung. No pleural effusion or pneumothorax.  IMPRESSION: No acute cardiopulmonary process.   Electronically Signed   By: Annia Belt M.D.   On: 02/13/2014 15:58   Ct Head Wo Contrast  02/13/2014   CLINICAL DATA:  Altered mental status  EXAM: CT HEAD WITHOUT CONTRAST  TECHNIQUE: Contiguous axial  images were obtained from the base of the skull through the vertex without intravenous contrast.  COMPARISON:  February 25, 2014  FINDINGS: The bony calvarium is intact. No gross soft tissue abnormality is noted. Diffuse atrophic changes are seen. No acute large territory infarct is identified. No hemorrhage or space-occupying mass lesion is noted.  IMPRESSION: Chronic changes without acute abnormality.   Electronically Signed   By: Alcide Clever M.D.   On: 02/13/2014 11:45   Mr Maxine Glenn Head Wo Contrast  02/13/2014   CLINICAL DATA:  Altered mental status. Aphasia. Hypertension diabetes and  hypercholesterolemia. Dementia.  EXAM: MRI HEAD WITHOUT CONTRAST  MRA HEAD WITHOUT CONTRAST  TECHNIQUE: Multiplanar, multiecho pulse sequences of the brain and surrounding structures were obtained without intravenous contrast. Angiographic images of the head were obtained using MRA technique without contrast.  COMPARISON:  None.  MRI February 25, 2014  FINDINGS: MRI HEAD FINDINGS  Resolving areas of subacute infarct in the left splenium of the corpus callosum and left posterior body of the corpus callosum as noted previously.  New area of restricted diffusion in the left parietal lobe measuring under 1 cm consistent with acute infarct since the prior MRI. No other areas of acute infarct  Moderate to advanced atrophy. Advanced chronic ischemic changes throughout the white matter. Chronic left parietal infarct. Chronic ischemia in the pons and left cerebellum. Negative for hemorrhage. Negative for mass or edema  MRA HEAD FINDINGS  Both vertebral arteries are patent to the basilar. PICA patent bilaterally. Basilar widely patent. Superior cerebellar arteries are patent bilaterally. Moderate stenosis distal left posterior cerebral artery and severe stenosis distal right posterior cerebral artery  Atherosclerotic disease in the cavernous carotid artery with moderate stenosis on the left and mild stenosis on the right.  Moderate to severe stenosis proximal right A1 segment. Right anterior cerebral artery is patent. Right M1 segment is patent. Moderate disease in the distal right M1 branches  Moderate stenosis in the proximal left A1 segment. Left M1 segment shows mild narrowing proximally. Mild stenosis in the distal left middle cerebral artery branches  Negative for cerebral aneurysm  IMPRESSION: Sub cm area of acute infarct in the left parietal cortex.  Areas of subacute infarction in the left corpus callosum as noted on the recent MRI of 2014/02/25  Pronounced atrophy and chronic ischemic changes.  Moderate intracranial  atherosclerotic disease without large vessel occlusion.   Electronically Signed   By: Marlan Palau M.D.   On: 02/13/2014 15:46   Mr Brain Wo Contrast  02/13/2014   CLINICAL DATA:  Altered mental status. Aphasia. Hypertension diabetes and hypercholesterolemia. Dementia.  EXAM: MRI HEAD WITHOUT CONTRAST  MRA HEAD WITHOUT CONTRAST  TECHNIQUE: Multiplanar, multiecho pulse sequences of the brain and surrounding structures were obtained without intravenous contrast. Angiographic images of the head were obtained using MRA technique without contrast.  COMPARISON:  None.  MRI February 25, 2014  FINDINGS: MRI HEAD FINDINGS  Resolving areas of subacute infarct in the left splenium of the corpus callosum and left posterior body of the corpus callosum as noted previously.  New area of restricted diffusion in the left parietal lobe measuring under 1 cm consistent with acute infarct since the prior MRI. No other areas of acute infarct  Moderate to advanced atrophy. Advanced chronic ischemic changes throughout the white matter. Chronic left parietal infarct. Chronic ischemia in the pons and left cerebellum. Negative for hemorrhage. Negative for mass or edema  MRA HEAD FINDINGS  Both vertebral arteries are patent to the basilar. PICA patent bilaterally. Basilar  widely patent. Superior cerebellar arteries are patent bilaterally. Moderate stenosis distal left posterior cerebral artery and severe stenosis distal right posterior cerebral artery  Atherosclerotic disease in the cavernous carotid artery with moderate stenosis on the left and mild stenosis on the right.  Moderate to severe stenosis proximal right A1 segment. Right anterior cerebral artery is patent. Right M1 segment is patent. Moderate disease in the distal right M1 branches  Moderate stenosis in the proximal left A1 segment. Left M1 segment shows mild narrowing proximally. Mild stenosis in the distal left middle cerebral artery branches  Negative for cerebral aneurysm   IMPRESSION: Sub cm area of acute infarct in the left parietal cortex.  Areas of subacute infarction in the left corpus callosum as noted on the recent MRI of 2014/02/21  Pronounced atrophy and chronic ischemic changes.  Moderate intracranial atherosclerotic disease without large vessel occlusion.   Electronically Signed   By: Marlan Palau M.D.   On: 02/13/2014 15:46   Dg Chest Portable 1 View  02/13/2014   CLINICAL DATA:  Altered mental status  EXAM: PORTABLE CHEST - 1 VIEW  COMPARISON:  Chest radiograph 2014/02/21  FINDINGS: Stable cardiac and mediastinal contours. Low lung volumes. Elevation right hemidiaphragm. Minimal heterogeneous opacities right lung base. No pleural effusion or pneumothorax. Regional skeleton is unremarkable.  IMPRESSION: Elevation right hemidiaphragm. Minimal heterogeneous opacities right lung base favored to represent atelectasis. Infection not excluded.   Electronically Signed   By: Annia Belt M.D.   On: 02/13/2014 12:56   Medications: I have reviewed the patient's current medications. Scheduled Meds: . aspirin EC  81 mg Oral Daily  . insulin aspart  0-15 Units Subcutaneous TID WC  . insulin glargine  5 Units Subcutaneous QHS  . levETIRAcetam  1,000 mg Intravenous Once  . levETIRAcetam  1,000 mg Intravenous BID  . levofloxacin  750 mg Oral Q48H  . LORazepam  2 mg Intravenous Once  . pantoprazole (PROTONIX) IV  40 mg Intravenous Q24H  . sodium chloride  3 mL Intravenous Q12H   Continuous Infusions: . sodium chloride 100 mL/hr at 02/14/14 1145   PRN Meds:. Assessment/Plan: Principal Problem:   Stroke Active Problems:   DIABETES MELLITUS, TYPE II   HYPERCHOLESTEROLEMIA   DEMENTIA   DEPRESSION   HYPERTENSION   Hyponatremia   History of stroke   Mild malnutrition   Altered mental status   Acute encephalopathy  Acute Stroke, ?Seizure: CT brain with no acute changes. MRI brain with sub cm area of acute infarct in left parietal cortex. Neuro following.  Recommend Keppra  BID, EEG, and no need for repeat stroke workup. EEG wnl. SLP evaluated and recommend dysphagia 2 diet. -ASA  daily -PT/OT  -Will follow up further recs by neuro -Restarted coumadin  UTI: He is afebrile with no leukocytosis - does not meet SIRS criteria. He does have large leukocytes, numerous WBC, few squamous epithelial cells and bacteria on UA. He received rocephin 1g in the ED. -Levaquin  Q 48 hours for 7 days -f/u urine culture   Hyponatremia: He is hyponatremic to 129 on admission - likely 2/2 poor PO intake. Improved this morning to 136. -Continue NS@100   Depression: previously on Prozac. Family endorses he was better on prozac -Restart Prozac  AKI on CKD stage 2: Cr 1.77 at admission. Baseline around 1.3. Likely pre-renal 2/2 decreased PO intake. Down to 1.46 today. -Resuscitate as above   EKG changes: sinus arrythmia with nonspecific t wave changes. Troponin negative. -continue to monitor  DM2:  On metformin  daily and lantus 15u daily. Last Hgb A1c 14 01/28/2014.  -SSI  -Lantus 5u QHS  -Hold metformin   GERD  -  protonix daily   DVT:  -therapeutic lovenox  -restart coumadin  FEN:  -dysphagia 2 diet -NS@100   Dispo: Disposition is deferred at this time, awaiting improvement of current medical problems.  Anticipated discharge in approximately 2 day(s).   The patient does have a current PCP Romero Belling, MD) and does not need an [facility] hospital follow-up appointment after discharge.  The patient does not have transportation limitations that hinder transportation to clinic appointments.  .Services Needed at time of discharge: Y = Yes, Blank = No PT: Home PT  OT:   RN:   Equipment:   Other:     LOS: 1 day   Griffin Basil, MD 02/14/2014, 1:29 PM

## 2014-02-14 NOTE — Procedures (Signed)
ELECTROENCEPHALOGRAM REPORT  Patient: Colin [name]       Room #: 2G40 EEG No. ID: [id number] Age: 80 y.o.        Sex: male Referring Physician: Sharlene Motts Report Date:  02/14/2014        Interpreting Physician: Aline Brochure  History: Colin [name] is an 80 y.o. male with a history of stroke presenting with 2 episodes concerning for seizure activity, arm shaking with one spell and an episode of unresponsiveness with slow return to baseline as manifestations of second spell.  Indications for study:  Rule out seizure activity.  Technique: This is an 18 channel routine scalp EEG performed at the bedside with bipolar and monopolar montages arranged in accordance to the international 10/20 system of electrode placement.   Description: This EEG recording was performed during wakefulness and during sleep. Predominant background activity during wakefulness consisted of 9 Hz symmetrical alpha rhythm which January well with eye opening. Photic stimulation and hyperventilation were not performed. There was slowing of background activity diffusely and symmetrically during sleep, with normal symmetrical vertex waves, sleep spindles and arousal responses reported during stage II of sleep. No epileptiform discharges occurred during wakefulness nor during sleep. There was no abnormal slowing of cerebral activity.  Interpretation: This is a normal EEG recording during wakefulness and during sleep.   Colin [name] M.D. [facility] [phone]

## 2014-02-14 NOTE — Progress Notes (Signed)
Pt continues to be minimally responsive with no PO intake. Pt CBG at 1734 70. MD notified. Orders received to give 1 ampule D50 IV x 1. Pt CBG recheck at 1917 was 141. Will continue to monitor pt. Joylene Grapes

## 2014-02-14 NOTE — Progress Notes (Signed)
Pt with periods of drowsiness throughout am. Pt now unarousable to sternal rub. Pt vital signs stable. Primary MD notified. Neurology, Dr. Roseanne Reno notified. Orders received by Dr. Roseanne Reno to give  IV ativan now and 1000 mg IV keppra x1 now. Orders also received to increase IV keppra to 1000 mg bid. Will continue to monitor pt. Joylene Grapes

## 2014-02-14 NOTE — Progress Notes (Signed)
Internal Medicine Night Float Interim Progress Note  S: Day team called by nurse [phone] saying patient minimally responsive with no PO intake and CBG of 70.  Given 1 amp D50.  Went to check on patient.  He was sleeping and difficult to arouse, which has been stable for him over the last couple of days.  Was able to wake patient.  He nodded his head to say he is doing okay.  O: Filed Vitals:   02/14/14 2015  BP: 151/80  Pulse: 44  Temp: 97.3 F (36.3 C)  Resp: 14   Physical Exam  Constitutional:  Sleeping, difficult to arouse but wake-able. Opens eyes to stimulation.   CBG @ 1917: 141  A/P: Patient at current baseline.  Unclear if decreased responsiveness due to medical process such as depression or seizure activity or lack of interest in participating in care.  Low blood sugar resolved. -Continue to monitor mental status. -CBG q4h.

## 2014-02-14 NOTE — Progress Notes (Signed)
OT Cancellation Note  Patient Details Name: Colin [name] MRN: [id number] DOB: Oct 21, 1932   Cancelled Treatment:    Reason Eval/Treat Not Completed: Fatigue/lethargy limiting ability to participate;Patient's level of consciousness. Patient asleep upon entering room with phlebotomist present. Phlebotomist unable to arouse patient, this therapist unable to arouse patient. RN present in room and stated doctors were aware of patient's level of consciousness. Will follow-up with OT evaluation as able.    Kyshaun Barnette 02/14/2014, 1:28 PM

## 2014-02-14 NOTE — Progress Notes (Addendum)
ANTICOAGULATION CONSULT NOTE - Follow Up Consult  Pharmacy Consult for Coumadin Indication: DVT  Allergies  Allergen Reactions  . Ramipril     REACTION: Cough    Patient Measurements: Height: 5' 4.96" (165 cm) Weight: 186 lb 4.6 oz (84.5 kg) IBW/kg (Calculated) : 61.41  Vital Signs: Temp: 97.7 F (36.5 C) (09/17 1211) Temp src: Axillary (09/17 1211) BP: 117/70 mmHg (09/17 1240) Pulse Rate: 57 (09/17 1211)  Labs:  Recent Labs  02/13/14 1115 02/13/14 1359 02/13/14 1847 02/14/14 0545  HGB 11.9*  --   --   --   HCT 35.0*  --   --   --   PLT 262  --   --   --   LABPROT  --  19.3*  --   --   INR  --  1.63*  --   --   CREATININE 1.77*  --  1.47* 1.46*  TROPONINI  --   --  <0.30  --     Estimated Creatinine Clearance: 41 ml/min (by C-G formula based on Cr of 1.46).  Assessment: 81yom on coumadin pta for recent DVT (confirmed by dopplers 9/2), admitted with AMS. INR on admit was 1.6 - coumadin held as patient unable to swallow and therapeutic lovenox started. Today he passed his swallow evaluation and coumadin ordered to continue. Clarified with Dr. Isabella Bowens that lovenox is to continue until INR therapeutic given recent DVT.  Today's INR is pending. Patient is also starting a 7 day course of levaquin which can increase coumadin sensitivity so will need to monitor INR closely.  Home dose:  daily - last dose 9/15  Goal of Therapy:  INR 2-3 Monitor platelets by anticoagulation protocol: Yes   Plan:  1) Follow up today's INR and dose coumadin  Fredrik Rigger 02/14/2014,2:18 PM  Addendum:  Today's INR resulted late afternoon at 1.68. Patient unable to take warfarin last night d/t swallowing difficulties. Will give a little extra tonight.  Patient also noted to have UTI and has been prescribed levofloxacin, will renally adjust to  daily for treatment no other adjustments are required. Will continue to watch INR closely while on  levofloxacin.  Plan: Levofloxacin  IV daily - change to po when swallowing improved Warfarin 7.5mg  tonight  Sheppard Coil PharmD., BCPS Clinical Pharmacist Pager [phone] 02/14/2014 6:05 PM

## 2014-02-15 DIAGNOSIS — R404 Transient alteration of awareness: Secondary | ICD-10-CM | POA: Diagnosis present

## 2014-02-15 DIAGNOSIS — N390 Urinary tract infection, site not specified: Secondary | ICD-10-CM | POA: Diagnosis present

## 2014-02-15 DIAGNOSIS — E7800 Pure hypercholesterolemia, unspecified: Secondary | ICD-10-CM | POA: Diagnosis present

## 2014-02-15 DIAGNOSIS — K219 Gastro-esophageal reflux disease without esophagitis: Secondary | ICD-10-CM | POA: Diagnosis present

## 2014-02-15 DIAGNOSIS — Z86718 Personal history of other venous thrombosis and embolism: Secondary | ICD-10-CM | POA: Diagnosis not present

## 2014-02-15 DIAGNOSIS — R29898 Other symptoms and signs involving the musculoskeletal system: Secondary | ICD-10-CM | POA: Diagnosis present

## 2014-02-15 DIAGNOSIS — F3289 Other specified depressive episodes: Secondary | ICD-10-CM | POA: Diagnosis present

## 2014-02-15 DIAGNOSIS — Z8673 Personal history of transient ischemic attack (TIA), and cerebral infarction without residual deficits: Secondary | ICD-10-CM | POA: Diagnosis not present

## 2014-02-15 DIAGNOSIS — E871 Hypo-osmolality and hyponatremia: Secondary | ICD-10-CM | POA: Diagnosis present

## 2014-02-15 DIAGNOSIS — I129 Hypertensive chronic kidney disease with stage 1 through stage 4 chronic kidney disease, or unspecified chronic kidney disease: Secondary | ICD-10-CM | POA: Diagnosis present

## 2014-02-15 DIAGNOSIS — Z794 Long term (current) use of insulin: Secondary | ICD-10-CM | POA: Diagnosis not present

## 2014-02-15 DIAGNOSIS — F0390 Unspecified dementia without behavioral disturbance: Secondary | ICD-10-CM | POA: Diagnosis present

## 2014-02-15 DIAGNOSIS — R5381 Other malaise: Secondary | ICD-10-CM | POA: Diagnosis present

## 2014-02-15 DIAGNOSIS — Z7901 Long term (current) use of anticoagulants: Secondary | ICD-10-CM | POA: Diagnosis not present

## 2014-02-15 DIAGNOSIS — E441 Mild protein-calorie malnutrition: Secondary | ICD-10-CM | POA: Diagnosis present

## 2014-02-15 DIAGNOSIS — Z79899 Other long term (current) drug therapy: Secondary | ICD-10-CM | POA: Diagnosis not present

## 2014-02-15 DIAGNOSIS — E119 Type 2 diabetes mellitus without complications: Secondary | ICD-10-CM | POA: Diagnosis present

## 2014-02-15 DIAGNOSIS — R569 Unspecified convulsions: Secondary | ICD-10-CM | POA: Diagnosis present

## 2014-02-15 DIAGNOSIS — N182 Chronic kidney disease, stage 2 (mild): Secondary | ICD-10-CM | POA: Diagnosis present

## 2014-02-15 DIAGNOSIS — R4701 Aphasia: Secondary | ICD-10-CM | POA: Diagnosis present

## 2014-02-15 DIAGNOSIS — F329 Major depressive disorder, single episode, unspecified: Secondary | ICD-10-CM | POA: Diagnosis present

## 2014-02-15 DIAGNOSIS — I6350 Cerebral infarction due to unspecified occlusion or stenosis of unspecified cerebral artery: Secondary | ICD-10-CM | POA: Diagnosis present

## 2014-02-15 DIAGNOSIS — Z87891 Personal history of nicotine dependence: Secondary | ICD-10-CM | POA: Diagnosis not present

## 2014-02-15 DIAGNOSIS — N179 Acute kidney failure, unspecified: Secondary | ICD-10-CM | POA: Diagnosis present

## 2014-02-15 DIAGNOSIS — G9340 Encephalopathy, unspecified: Secondary | ICD-10-CM | POA: Diagnosis present

## 2014-02-15 LAB — CBC
HCT: 33.4 % — ABNORMAL LOW (ref 39.0–52.0)
Hemoglobin: 11.2 g/dL — ABNORMAL LOW (ref 13.0–17.0)
MCH: 28.7 pg (ref 26.0–34.0)
MCHC: 33.5 g/dL (ref 30.0–36.0)
MCV: 85.6 fL (ref 78.0–100.0)
PLATELETS: 215 10*3/uL (ref 150–400)
RBC: 3.9 MIL/uL — AB (ref 4.22–5.81)
RDW: 13 % (ref 11.5–15.5)
WBC: 3.6 10*3/uL — AB (ref 4.0–10.5)

## 2014-02-15 LAB — GLUCOSE, CAPILLARY
GLUCOSE-CAPILLARY: 182 mg/dL — AB (ref 70–99)
GLUCOSE-CAPILLARY: 189 mg/dL — AB (ref 70–99)
Glucose-Capillary: 117 mg/dL — ABNORMAL HIGH (ref 70–99)
Glucose-Capillary: 122 mg/dL — ABNORMAL HIGH (ref 70–99)
Glucose-Capillary: 87 mg/dL (ref 70–99)
Glucose-Capillary: 91 mg/dL (ref 70–99)

## 2014-02-15 LAB — BASIC METABOLIC PANEL
ANION GAP: 14 (ref 5–15)
BUN: 14 mg/dL (ref 6–23)
CALCIUM: 8.3 mg/dL — AB (ref 8.4–10.5)
CO2: 22 mEq/L (ref 19–32)
Chloride: 101 mEq/L (ref 96–112)
Creatinine, Ser: 1.2 mg/dL (ref 0.50–1.35)
GFR, EST AFRICAN AMERICAN: 65 mL/min — AB (ref >90)
GFR, EST NON AFRICAN AMERICAN: 56 mL/min — AB (ref >90)
GLUCOSE: 106 mg/dL — AB (ref 70–99)
POTASSIUM: 4.3 meq/L (ref 3.7–5.3)
SODIUM: 137 meq/L (ref 137–147)

## 2014-02-15 LAB — PROTIME-INR
INR: 1.68 — AB (ref 0.00–1.49)
Prothrombin Time: 19.8 seconds — ABNORMAL HIGH (ref 11.6–15.2)

## 2014-02-15 MED ORDER — ENSURE PUDDING PO PUDG
1.0000 | Freq: Three times a day (TID) | ORAL | Status: DC
  Administered 2014-02-15 – 2014-02-18 (×9): 1 via ORAL

## 2014-02-15 MED ORDER — WARFARIN SODIUM 7.5 MG PO TABS
7.5000 mg | ORAL_TABLET | Freq: Once | ORAL | Status: AC
  Administered 2014-02-15: 7.5 mg via ORAL
  Filled 2014-02-15: qty 1

## 2014-02-15 MED ORDER — PANTOPRAZOLE SODIUM 40 MG PO TBEC
40.0000 mg | DELAYED_RELEASE_TABLET | ORAL | Status: DC
  Administered 2014-02-15 – 2014-02-17 (×3): 40 mg via ORAL
  Filled 2014-02-15 (×3): qty 1

## 2014-02-15 MED ORDER — ENOXAPARIN SODIUM 120 MG/0.8ML ~~LOC~~ SOLN
115.0000 mg | SUBCUTANEOUS | Status: DC
  Administered 2014-02-15: 22:00:00 via SUBCUTANEOUS
  Administered 2014-02-16 – 2014-02-17 (×2): 115 mg via SUBCUTANEOUS
  Filled 2014-02-15 (×4): qty 0.8

## 2014-02-15 MED ORDER — ATORVASTATIN CALCIUM 40 MG PO TABS
40.0000 mg | ORAL_TABLET | Freq: Every day | ORAL | Status: DC
  Administered 2014-02-15 – 2014-02-18 (×4): 40 mg via ORAL
  Filled 2014-02-15 (×4): qty 1

## 2014-02-15 NOTE — Progress Notes (Signed)
Clinical Social Work Department BRIEF PSYCHOSOCIAL ASSESSMENT 02/15/2014  Patient:  Colin [name],Colin [name]     Account Number:  [id number]     Admit date:  02/13/2014  Clinical Social Worker:  Harless Nakayama  Date/Time:  02/15/2014 11:00 AM  Referred by:  Physician  Date Referred:  02/15/2014 Referred for  SNF Placement   Other Referral:   Interview type:  Family Other interview type:   Spoke with pt daughter over the phone    PSYCHOSOCIAL DATA Living Status:  WITH ADULT CHILDREN Admitted from facility:   Level of care:   Primary support name:  Marquita Palms Primary support relationship to patient:  CHILD, ADULT Degree of support available:   Pt has good support    CURRENT CONCERNS Current Concerns  Post-Acute Placement   Other Concerns:    SOCIAL WORK ASSESSMENT / PLAN CSW aware of PT recommendation. CSW called pt daughter to discuss recommendation since pt is confused at this time. CSW explained recommendation to pt daughter. She informed CSW that pt currently is at home with her and open with home health services with [facility]. CSW spoke with pt daughter about SNF referral process and insurance authorization. Pt daughter informed CSW that she would only want pt to go to SNF if he would be able to stay in [place]. CSW did notify that this will likely be depenedent on insurance authorization being available at time of discharge. Pt daughter was understanding of this and agreeable to pt returning home with her if rehab in [place] cannot be worked out.   Assessment/plan status:  Psychosocial Support/Ongoing Assessment of Needs Other assessment/ plan:   Information/referral to community resources:   SNF list to be provided with bed offers    PATIENT'S/FAMILY'S RESPONSE TO PLAN OF CARE: Pt daughter agreeable to SNF option only in [facility] [place], [place] [phone]

## 2014-02-15 NOTE — Progress Notes (Signed)
CSW Proofreader) spoke with pt daughter and provided with bed offers. Pt daughter would like to accept bed at [facility]. CSW notified facility and asked them to please begin insurance authorization as soon as possible.  Shaia Porath, LCSWA [phone]

## 2014-02-15 NOTE — Progress Notes (Signed)
Physical Therapy Treatment Patient Details Name: Colin [name] MRN: [id number] DOB: 1933/03/27 Today's Date: 02/15/2014    History of Present Illness 80 YO man with a PMH of DM2, CAD with CABGx4 in 1997, HTN, stroke, and arthritis who was brought to the ED today by his family because they were concerned about dehydration and the patient's declining health over the past 2 months. MRI revealed Small acute nonhemorrhagic infarcts involving the left aspect of the posterior body of the corpus callosum and left aspect of the splenium of the corpus callosum. Pt with dehydration/hyponatremia, FTT.    PT Comments    Pt is more alert once sitting today compared to last session's note.  He was able to participate in some sitting balance, command following activities EOB.  He required two person mod to max assist to transfer (squat pivot) to the recliner chair.  Due to his heavy level of assistance and requiring two people for all of his mobility, I am recommending SNF level rehab at this time.  PT will continue to follow acutely.    Follow Up Recommendations  SNF     Equipment Recommendations  Wheelchair (measurements PT);Wheelchair cushion (measurements PT);Hospital bed;Other (comment) (hoyer lift)    Recommendations for Other Services   NA     Precautions / Restrictions Precautions Precautions: Fall Precaution Comments: bil leg weakness R>L    Mobility  Bed Mobility Overal bed mobility: Needs Assistance Bed Mobility: Supine to Sit     Supine to sit: +2 for physical assistance;Max assist     General bed mobility comments: Two person max assist to progress legs to EOB and support trunk during transition to sitting.  Pt unable to initiate movement until it is initiated for him by therapists.   Transfers Overall transfer level: Needs assistance Equipment used: None Transfers: Sit to/from [employer] Sit to Stand: +2 physical assistance;Max assist   Squat pivot  transfers: +2 physical assistance;Max assist     General transfer comment: Two person max assist to support trunk over weak legs.  Pt attempting to use his arms for support, knees flexed during transfer.  Pt is low max high mod for second stand from recliner chair to straighten pad.  The more alert he is the less assistance he needs.   Ambulation/Gait             General Gait Details: Unable at this time.        Modified Rankin (Stroke Patients Only) Modified Rankin (Stroke Patients Only) Pre-Morbid Rankin Score: Moderately severe disability Modified Rankin: Severe disability     Balance Overall balance assessment: Needs assistance Sitting-balance support: Feet supported;Bilateral upper extremity supported;Single extremity supported Sitting balance-Leahy Scale: Poor Sitting balance - Comments: Fluctuating level of assistnace in sitting from mod to min.  Left lean when unsupported by therapist.  Postural control: Left lateral lean Standing balance support: Bilateral upper extremity supported Standing balance-Leahy Scale: Zero Standing balance comment: up to max assist in standing                     Cognition Arousal/Alertness: Lethargic Behavior During Therapy: Flat affect Overall Cognitive Status: No family/caregiver present to determine baseline cognitive functioning Area of Impairment: Attention;Memory;Following commands;Safety/judgement;Problem solving;Awareness;Orientation Orientation Level: Disoriented to;Time;Situation (did know "hospital") Current Attention Level: Sustained Memory: Decreased short-term memory Following Commands: Follows one step commands inconsistently;Follows one step commands with increased time Safety/Judgement: Decreased awareness of safety;Decreased awareness of deficits Awareness: Intellectual Problem Solving: Slow processing;Decreased initiation;Difficulty sequencing;Requires verbal cues;Requires  tactile cues General Comments: Pt  able to answer some one step commands with increased time           Pertinent Vitals/Pain Pain Assessment: Faces Pain Score: 0-No pain (at rest, some pain/grimacing initially with movement of legs) Pain Intervention(s): Repositioned;Monitored during session;Limited activity within patient's tolerance           PT Goals (current goals can now be found in the care plan section) Acute Rehab PT Goals Patient Stated Goal: none stated Progress towards PT goals: Progressing toward goals    Frequency  Min 2X/week    PT Plan Discharge plan needs to be updated    Co-evaluation PT/OT/SLP Co-Evaluation/Treatment: Yes Reason for Co-Treatment: Necessary to address cognition/behavior during functional activity;For patient/therapist safety PT goals addressed during session: Mobility/safety with mobility;Balance;Strengthening/ROM       End of Session Equipment Utilized During Treatment: Gait belt Activity Tolerance: Patient limited by lethargy Patient left: in chair;with call bell/phone within reach;with chair alarm set     Time: 1000-1023 PT Time Calculation (min): 23 min  Charges:  $Therapeutic Activity: 8-22 mins                      Kyleigh Nannini B. Ravinder Lukehart, PT, DPT [phone]   02/15/2014, 11:08 AM

## 2014-02-15 NOTE — Progress Notes (Signed)
Speech Language Pathology Treatment: Dysphagia  Patient Details Name: Bush Murdoch MRN: [id number] DOB: 11-Aug-1932 Today's Date: 02/15/2014 Time: 2774-1287 SLP Time Calculation (min): 22 min  Assessment / Plan / Recommendation Clinical Impression  Patient appears to be tolerating Dysphagia 2 diet with thin liquids without s/s of aspiration.  Pt. Is afebrile, lung sounds are diminished, but CXR is clear.  Will d/c ST in the acute care setting.  Further needs may be addressed by SNF SLP if indicated.   HPI HPI: Mr. Patriarca is a 80 year old man with history of DM2, HTN, recently hospitalized 8/31 to 9/3 for small acute stroke, cognitive decline, R LE DVT started on warfarin, who presents 9/16 nonverbal x 2 days and R arm weakness x 1 day. MRI reveals small, acute infarct in left parietal lobe. Work-up also underway for possible seizures. Pt was seen during most recent admission for swallowing and was recommended regular textures and thin liquids with full supervision for impulsivity. Most recent CXR is clear.   Pertinent Vitals Pain Assessment: No/denies pain  SLP Plan  All goals met;Discharge SLP treatment due to (comment) (Pt was not d/c'd by previous SLP)    Recommendations Diet recommendations: Dysphagia 2 (fine chop);Thin liquid Liquids provided via: Cup;No straw Medication Administration: Whole meds with puree Supervision: Staff to assist with self feeding;Full supervision/cueing for compensatory strategies Compensations: Slow rate;Small sips/bites Postural Changes and/or Swallow Maneuvers: Seated upright 90 degrees              Oral Care Recommendations: Oral care BID Follow up Recommendations: Skilled Nursing facility Plan: All goals met;Discharge SLP treatment due to (comment) (Pt was not d/c'd by previous SLP)    GO     Quinn Axe T 02/15/2014, 5:23 PM

## 2014-02-15 NOTE — Progress Notes (Addendum)
Clinical Social Work Department CLINICAL SOCIAL WORK PLACEMENT NOTE 02/15/2014  Patient:  Colin [name],Colin [name]  Account Number:  [id number] Admit date:  02/13/2014  Clinical Social Worker:  Sharol Harness, Theresia Majors  Date/time:  02/15/2014 11:50 AM  Clinical Social Work is seeking post-discharge placement for this patient at the following level of care:   SKILLED NURSING   (*CSW will update this form in Epic as items are completed)   02/15/2014  Patient/family provided with [facility] Department of Clinical Social Work's list of facilities offering this level of care within the geographic area requested by the patient (or if unable, by the patient's family).  02/15/2014  Patient/family informed of their freedom to choose among providers that offer the needed level of care, that participate in Medicare, Medicaid or managed care program needed by the patient, have an available bed and are willing to accept the patient.  02/15/2014  Patient/family informed of [facility]' ownership interest in [facility], as well as of the fact that they are under no obligation to receive care at this facility.  PASARR submitted to EDS on  PASARR number received on   FL2 transmitted to all facilities in geographic area requested by pt/family on  02/15/2014 FL2 transmitted to all facilities within larger geographic area on   Patient informed that his/her managed care company has contracts with or will negotiate with  certain facilities, including the following:     Patient/family informed of bed offers received:  02/15/2014 Patient chooses bed at [facility] Physician recommends and patient chooses bed at    [facility]  02/18/2014 Patient to be transferred to facility by [facility] Patient and family notified of transfer on 02/18/2014 Name of family member notified:  Janese Banks (Daughter)  The following physician request were entered in  Epic: Physician Request  Please sign FL2.    Additional CommentsSharol Harness, LCSWA [phone]

## 2014-02-15 NOTE — Progress Notes (Signed)
ANTICOAGULATION CONSULT NOTE - Follow Up Consult  Pharmacy Consult for Coumadin and Lovenox Indication: DVT  Allergies  Allergen Reactions  . Ramipril     REACTION: Cough    Patient Measurements: Height: 5' 4.96" (165 cm) Weight: 168 lb 14.4 oz (76.613 kg) IBW/kg (Calculated) : 61.41  Vital Signs: Temp: 98.5 F (36.9 C) (09/18 0435) Temp src: Oral (09/18 0435) BP: 132/50 mmHg (09/18 0435) Pulse Rate: 77 (09/18 0435)  Labs:  Recent Labs  02/13/14 1115 02/13/14 1359 02/13/14 1847 02/14/14 0545 02/14/14 1645 02/15/14 0530  HGB 11.9*  --   --   --   --  11.2*  HCT 35.0*  --   --   --   --  33.4*  PLT 262  --   --   --   --  215  LABPROT  --  19.3*  --   --  19.8* 19.8*  INR  --  1.63*  --   --  1.68* 1.68*  CREATININE 1.77*  --  1.47* 1.46*  --  1.20  TROPONINI  --   --  <0.30  --   --   --     Estimated Creatinine Clearance: 47.7 ml/min (by C-G formula based on Cr of 1.2).  Assessment: 81yom on coumadin pta for recent DVT (confirmed by dopplers 9/2), admitted with AMS. INR on admit was 1.6 - coumadin held as patient unable to swallow and therapeutic lovenox started. He passed his swallow evaluation yesterday and coumadin was resumed. INR remains unchanged after 7.5mg  given last night. Continues on levaquin for UTI.  His weight has also decreased over the last 2 days, 84.5kg to 76.6kg. Will need to adjust lovenox dose accordingly.  Goal of Therapy:  INR 2-3 Monitor platelets by anticoagulation protocol: Yes   Plan:  1) Repeat coumadin 7.5mg  x 1 2) Change lovenox to  (1.5mg /kg) q24 until INR > 2 3) INR in AM  Fredrik Rigger 02/15/2014,8:29 AM

## 2014-02-15 NOTE — Progress Notes (Signed)
Subjective: Patient had no complaints today. No overnight adverse events were reported.  Objective: Current vital signs: BP 157/60  Pulse 50  Temp(Src) 98.3 F (36.8 C) (Oral)  Resp 20  Ht 5' 4.96" (1.65 m)  Wt 76.613 kg (168 lb 14.4 oz)  BMI 28.14 kg/m2  SpO2 100%  Neurologic Exam: Patient was alert and oriented in no acute distress. He was cooperative for the most part with examination. Extraocular movements were full and conjugate. Visual fields were intact and normal. Facial weakness was noted. He moved extremities equally with symmetrical strength throughout.  Medications: I have reviewed the patient's current medications.  Assessment/Plan: 80 year old male with new onset focal, as well as apparent generalized seizures. He has no clinical signs of an acute recurrent stroke. Keppra was increased following his episode of unresponsiveness yesterday. He appears to be tolerating current dose of Keppra, 1000 mg every 12 hours, without significant side effects.  Recommend no changes in current management. We will continue to follow this patient with you.  C.R. Roseanne Reno, MD [facility] [phone]  02/15/2014  9:04 PM

## 2014-02-15 NOTE — Progress Notes (Signed)
  Date: 02/15/2014  Patient name: Colin [name]  Medical record number: [id number]  Date of birth: 1933/01/16   This patient has been seen and the plan of care was discussed with the house staff. Please see their note for complete details. I concur with their findings with the following additions/corrections:  Patient improved this AM, able to follow some simple commands, continues to be somnolent.  He is on Keppra for possible seizures.  Would appreciate Neurology given recommendations about continued need for anti-seizure medication.  He is not on any sedating meds except possibly the Keppra.  Prozac was started for possibly depression playing a role in his MS.  Will have team touch base with family today.   Inez Catalina, MD 02/15/2014, 2:37 PM

## 2014-02-15 NOTE — Progress Notes (Signed)
Subjective: Colin [name] was more responsive this morning. He reports that he feels good. He is hungry.   Objective: Vital signs in last 24 hours: Filed Vitals:   02/14/14 2015 02/15/14 0045 02/15/14 0435 02/15/14 1214  BP: 151/80 127/81 132/50 148/64  Pulse: 44 40 77 82  Temp: 97.3 F (36.3 C) 98 F (36.7 C) 98.5 F (36.9 C) 97.3 F (36.3 C)  TempSrc: Axillary Oral Oral Oral  Resp: Height:      Weight:   168 lb 14.4 oz (76.613 kg)   SpO2: 100% 97% 100% 100%   Weight change: -17 lb 6.2 oz (-7.887 kg)  Intake/Output Summary (Last 24 hours) at 02/15/14 1326 Last data filed at 02/15/14 0455  Gross per 24 hour  Intake    240 ml  Output   1075 ml  Net   -835 ml   General Apperance: NAD  Head: Normocephalic, atraumatic  Eyes: PERRL, anicteric sclera  Ears: Normal external ear canal  Nose: Nares normal, septum midline, mucosa normal  Throat: Lips, mucosa normal  Neck: Supple, trachea midline  Back: No tenderness or bony abnormality  Lungs: Clear to auscultation bilaterally, no w/r/r Heart: Irregularly irregular, no m/r/g Chest Wall: Nontender, no deformities Abdomen: Soft, nontender, nondistended, no rebound/guarding  Extremities: Normal, atraumatic, warm and well perfused, no edema  Pulses: 2+ throughout  Skin: No rashes or lesions  Neurologic: Arousable to voice. Remains somnolent. Follows only some commands with poor effort. Able to lift LUE. Able to move BLE.  Lab Results: Basic Metabolic Panel:  Recent Labs Lab 02/14/14 0545 02/15/14 0530  NA 136* 137  K 4.1 4.3  CL 100 101  CO2 22 22  GLUCOSE 105* 106*  BUN 21 14  CREATININE 1.46* 1.20  CALCIUM 8.3* 8.3*   Liver Function Tests:  Recent Labs Lab 02/13/14 1115  AST 30  ALT 22  ALKPHOS 55  BILITOT 0.5  PROT 8.1  ALBUMIN 3.2*   CBC:  Recent Labs Lab 02/13/14 1115 02/15/14 0530  WBC 5.5 3.6*  NEUTROABS 3.3  --   HGB 11.9* 11.2*  HCT 35.0* 33.4*  MCV 84.3 85.6  PLT 262 215    Cardiac Enzymes:  Recent Labs Lab 02/13/14 1847  TROPONINI <0.30   CBG:  Recent Labs Lab 02/14/14 1734 02/14/14 1917 02/15/14 0039 02/15/14 0433 02/15/14 0805 02/15/14 1139  GLUCAP 70 141* 87 117* 91 122*   Coagulation:  Recent Labs Lab 02/13/14 1359 02/14/14 1645 02/15/14 0530  LABPROT 19.3* 19.8* 19.8*  INR 1.63* 1.68* 1.68*   Urinalysis:  Recent Labs Lab 02/13/14 1220  COLORURINE AMBER*  LABSPEC 1.019  PHURINE 5.5  GLUCOSEU 250*  HGBUR LARGE*  BILIRUBINUR NEGATIVE  KETONESUR 15*  PROTEINUR 100*  UROBILINOGEN 0.2  NITRITE NEGATIVE  LEUKOCYTESUR LARGE*   Micro Results: Recent Results (from the past 240 hour(s))  URINE CULTURE     Status: None   Collection Time    02/13/14 12:20 PM      Result Value Ref Range Status   Specimen Description URINE, CATHETERIZED   Final   Special Requests NONE   Final   Culture  Setup Time     Final   Value: 02/13/2014 15:36     Performed at [facility] Count     Final   Value: 45,000 COLONIES/ML     Performed at [facility]   Culture     Final   Value: Multiple bacterial  morphotypes present, none predominant. Suggest appropriate recollection if clinically indicated.     Performed at [facility]   Report Status 02/14/2014 FINAL   Final   Studies/Results: Dg Chest 1 View  02/13/2014   CLINICAL DATA:  Altered mental status  EXAM: CHEST - 1 VIEW  COMPARISON:  Chest radiograph 02/13/2014.  FINDINGS: Stable cardiac and mediastinal contours status post median sternotomy and CABG procedure. No consolidative pulmonary opacities. Likely chronic left posterior ninth rib fracture. Possible calcified right pulmonary nodule versus overlying ossific density within the lateral aspect of mid right lung. No pleural effusion or pneumothorax.  IMPRESSION: No acute cardiopulmonary process.   Electronically Signed   By: Annia Belt M.D.   On: 02/13/2014 15:58   Mr Maxine Glenn Head Wo Contrast  02/13/2014    CLINICAL DATA:  Altered mental status. Aphasia. Hypertension diabetes and hypercholesterolemia. Dementia.  EXAM: MRI HEAD WITHOUT CONTRAST  MRA HEAD WITHOUT CONTRAST  TECHNIQUE: Multiplanar, multiecho pulse sequences of the brain and surrounding structures were obtained without intravenous contrast. Angiographic images of the head were obtained using MRA technique without contrast.  COMPARISON:  None.  MRI 2014/02/07  FINDINGS: MRI HEAD FINDINGS  Resolving areas of subacute infarct in the left splenium of the corpus callosum and left posterior body of the corpus callosum as noted previously.  New area of restricted diffusion in the left parietal lobe measuring under 1 cm consistent with acute infarct since the prior MRI. No other areas of acute infarct  Moderate to advanced atrophy. Advanced chronic ischemic changes throughout the white matter. Chronic left parietal infarct. Chronic ischemia in the pons and left cerebellum. Negative for hemorrhage. Negative for mass or edema  MRA HEAD FINDINGS  Both vertebral arteries are patent to the basilar. PICA patent bilaterally. Basilar widely patent. Superior cerebellar arteries are patent bilaterally. Moderate stenosis distal left posterior cerebral artery and severe stenosis distal right posterior cerebral artery  Atherosclerotic disease in the cavernous carotid artery with moderate stenosis on the left and mild stenosis on the right.  Moderate to severe stenosis proximal right A1 segment. Right anterior cerebral artery is patent. Right M1 segment is patent. Moderate disease in the distal right M1 branches  Moderate stenosis in the proximal left A1 segment. Left M1 segment shows mild narrowing proximally. Mild stenosis in the distal left middle cerebral artery branches  Negative for cerebral aneurysm  IMPRESSION: Sub cm area of acute infarct in the left parietal cortex.  Areas of subacute infarction in the left corpus callosum as noted on the recent MRI of 02/07/14   Pronounced atrophy and chronic ischemic changes.  Moderate intracranial atherosclerotic disease without large vessel occlusion.   Electronically Signed   By: Marlan Palau M.D.   On: 02/13/2014 15:46   Mr Brain Wo Contrast  02/13/2014   CLINICAL DATA:  Altered mental status. Aphasia. Hypertension diabetes and hypercholesterolemia. Dementia.  EXAM: MRI HEAD WITHOUT CONTRAST  MRA HEAD WITHOUT CONTRAST  TECHNIQUE: Multiplanar, multiecho pulse sequences of the brain and surrounding structures were obtained without intravenous contrast. Angiographic images of the head were obtained using MRA technique without contrast.  COMPARISON:  None.  MRI 2014/02/07  FINDINGS: MRI HEAD FINDINGS  Resolving areas of subacute infarct in the left splenium of the corpus callosum and left posterior body of the corpus callosum as noted previously.  New area of restricted diffusion in the left parietal lobe measuring under 1 cm consistent with acute infarct since the prior MRI. No other areas of acute infarct  Moderate to advanced atrophy. Advanced chronic ischemic changes throughout the white matter. Chronic left parietal infarct. Chronic ischemia in the pons and left cerebellum. Negative for hemorrhage. Negative for mass or edema  MRA HEAD FINDINGS  Both vertebral arteries are patent to the basilar. PICA patent bilaterally. Basilar widely patent. Superior cerebellar arteries are patent bilaterally. Moderate stenosis distal left posterior cerebral artery and severe stenosis distal right posterior cerebral artery  Atherosclerotic disease in the cavernous carotid artery with moderate stenosis on the left and mild stenosis on the right.  Moderate to severe stenosis proximal right A1 segment. Right anterior cerebral artery is patent. Right M1 segment is patent. Moderate disease in the distal right M1 branches  Moderate stenosis in the proximal left A1 segment. Left M1 segment shows mild narrowing proximally. Mild stenosis in the distal  left middle cerebral artery branches  Negative for cerebral aneurysm  IMPRESSION: Sub cm area of acute infarct in the left parietal cortex.  Areas of subacute infarction in the left corpus callosum as noted on the recent MRI of 02-11-14  Pronounced atrophy and chronic ischemic changes.  Moderate intracranial atherosclerotic disease without large vessel occlusion.   Electronically Signed   By: Marlan Palau M.D.   On: 02/13/2014 15:46   Medications: I have reviewed the patient's current medications. Scheduled Meds: . aspirin EC  81 mg Oral Daily  . atorvastatin  40 mg Oral q1800  . enoxaparin (LOVENOX) injection  115 mg Subcutaneous Q24H  . FLUoxetine  10 mg Oral Daily  . insulin aspart  0-15 Units Subcutaneous TID WC  . insulin glargine  5 Units Subcutaneous QHS  . levETIRAcetam  1,000 mg Intravenous BID  . LORazepam  2 mg Intravenous Once  . pantoprazole  40 mg Oral Q24H  . sodium chloride  3 mL Intravenous Q12H  . warfarin  7.5 mg Oral ONCE-1800  . Warfarin - Pharmacist Dosing Inpatient   Does not apply q1800   Continuous Infusions: . sodium chloride 75 mL/hr at 02/15/14 1042   PRN Meds:. Assessment/Plan: Principal Problem:   Stroke Active Problems:   DIABETES MELLITUS, TYPE II   HYPERCHOLESTEROLEMIA   DEMENTIA   DEPRESSION   HYPERTENSION   Hyponatremia   History of stroke   Mild malnutrition   Altered mental status   Acute encephalopathy  Acute Stroke, ?Seizure: CT brain with no acute changes. MRI brain with sub cm area of acute infarct in left parietal cortex. Neuro following. EEG wnl. SLP evaluated and recommend dysphagia 2 diet. PT/OT recommending SNF. Daughter agrees to SNF placement as long as one can be found in [place]. -Keppra 1000 BID -ASA  daily -atorvastatin  daily -Will follow up further recs by neuro -Continue coumadin  UTI: He received rocephin 1g in the ED. Continued Levaquin. Urine culture resulted with no predominant  organisms. -Discontinue Levaquin  Hyponatremia: He is hyponatremic to 129 on admission - likely 2/2 poor PO intake. Improved this morning to 137 -Continue NS@75   Depression: previously on Prozac. Family endorses he was better on prozac -Continue Prozac  daily  AKI on CKD stage 2: Cr 1.77 at admission. Baseline around 1.3. Likely pre-renal 2/2 decreased PO intake. Down to 1.2 today. Resolved.  EKG changes: sinus arrythmia with nonspecific t wave changes. Troponin negative. -continue to monitor  DM2: On metformin  daily and lantus 15u daily. Last Hgb A1c 14 2014/02/11.  -SSI  -Lantus 5u QHS  -Hold metformin   GERD  -  protonix daily   DVT:  -therapeutic  lovenox  -continue coumadin  FEN:  -dysphagia 2 diet -NS@75   Dispo: Disposition is deferred at this time, awaiting improvement of current medical problems.  Anticipated discharge in approximately 2 day(s).   The patient does have a current PCP Romero Belling, MD) and does not need an [facility] hospital follow-up appointment after discharge.  The patient does not have transportation limitations that hinder transportation to clinic appointments.  .Services Needed at time of discharge: Y = Yes, Blank = No PT:   OT:   RN: SNF  Equipment: Wheelchair, wheelchair cusion, hospital bed, hoyer lift  Other:     LOS: 2 days   Griffin Basil, MD 02/15/2014, 1:26 PM

## 2014-02-15 NOTE — Evaluation (Signed)
Occupational Therapy Evaluation Patient Details Name: Colin [name] MRN: [id number] DOB: 01-25-1933 Today's Date: 02/15/2014    History of Present Illness 80 YO man with a PMH of DM2, CAD with CABGx4 in 1997, HTN, stroke, and arthritis who was brought to the ED today by his family because they were concerned about dehydration and the patient's declining health over the past 2 months. MRI revealed Small acute nonhemorrhagic infarcts involving the left aspect of the posterior body of the corpus callosum and left aspect of the splenium of the corpus callosum. Pt with dehydration/hyponatremia, FTT.   Clinical Impression   Pt receiving total assist at home PTA.  Recent hospital admission with d/c home on 01/31/14.  Per OT note from previous admission, pt's family was providing increased level of assist. Due to re-admission and pt now requiring heavy level of +2 assist for simple squat pivot transfer,  OT to recommend pt d/c to SNF. Suspect pt is at baseline with ADLs (no family present during eval session today).  Will defer further OT services to next venue of care. Will sign off.    Follow Up Recommendations  Supervision/Assistance - 24 hour;SNF    Equipment Recommendations   (defer to next venue)    Recommendations for Other Services       Precautions / Restrictions Precautions Precautions: Fall Precaution Comments: bil leg weakness R>L      Mobility Bed Mobility Overal bed mobility: Needs Assistance Bed Mobility: Supine to Sit     Supine to sit: +2 for physical assistance;Max assist     General bed mobility comments: Two person max assist to progress legs to EOB and support trunk during transition to sitting.  Pt unable to initiate movement until it is initiated for him by therapists.   Transfers Overall transfer level: Needs assistance Equipment used: None Transfers: Sit to/from [employer] Sit to Stand: +2 physical assistance;Max assist   Squat pivot  transfers: +2 physical assistance;Max assist     General transfer comment: Two person max assist to support trunk over weak legs.  Pt attempting to use his arms for support, knees flexed during transfer.  Pt is low max high mod for second stand from recliner chair to straighten pad.  The more alert he is the less assistance he needs.     Balance Overall balance assessment: Needs assistance Sitting-balance support: Feet supported;Bilateral upper extremity supported Sitting balance-Leahy Scale: Poor Sitting balance - Comments: Fluctuating level of assistnace in sitting from mod to min.  Left lean when unsupported by therapist.  Postural control: Left lateral lean Standing balance support: Bilateral upper extremity supported Standing balance-Leahy Scale: Zero Standing balance comment: up to max assist in standing                             ADL Overall ADL's : Needs assistance/impaired     Grooming: Wash/dry face;Minimal assistance;Sitting                               Functional mobility during ADLs: +2 for physical assistance;Maximal assistance General ADL Comments: total assist with bathing/dressing - baseline     Vision                     Perception     Praxis      Pertinent Vitals/Pain Pain Assessment: Faces Pain Score: 0-No pain (at rest, some pain/grimacing  initially with movement of legs) Faces Pain Scale: Hurts little more Pain Location: bilateral LEs Pain Descriptors / Indicators: Grimacing Pain Intervention(s): Repositioned;Monitored during session;Limited activity within patient's tolerance     Hand Dominance Right   Extremity/Trunk Assessment Upper Extremity Assessment Upper Extremity Assessment: RUE deficits/detail RUE Deficits / Details: Tendency to keep RUE in shoulder abduction and elbow flexion while seated EOB.  AAROM WFL.         Cervical / Trunk Assessment Cervical / Trunk Assessment: Kyphotic (neck flexion  sitting EOB)   Communication Communication Communication: Expressive difficulties   Cognition Arousal/Alertness: Lethargic Behavior During Therapy: Flat affect Overall Cognitive Status: No family/caregiver present to determine baseline cognitive functioning Area of Impairment: Attention;Memory;Following commands;Safety/judgement;Problem solving;Awareness;Orientation Orientation Level: Disoriented to;Time;Situation Current Attention Level: Sustained Memory: Decreased short-term memory Following Commands: Follows one step commands inconsistently;Follows one step commands with increased time Safety/Judgement: Decreased awareness of safety;Decreased awareness of deficits Awareness: Intellectual Problem Solving: Slow processing;Decreased initiation;Difficulty sequencing;Requires verbal cues;Requires tactile cues General Comments: Pt able to answer some one step commands with increased time   General Comments       Exercises       Shoulder Instructions      Home Living Family/patient expects to be discharged to:: Private residence                                 Additional Comments: Pt lives with duaghter and son in law per PT note.  Family not present during session, and pt unable to provide info.      Prior Functioning/Environment Level of Independence: Needs assistance  Gait / Transfers Assistance Needed: nonambulatory; son-in-law "lifts and carries pt"  ADL's / Homemaking Assistance Needed: total A. set up for feeding   Comments: Information obtained from OT eval on 01/29/14 (pt's previous admission). Pt unable to provide info.    OT Diagnosis:     OT Problem List:     OT Treatment/Interventions:      OT Goals(Current goals can be found in the care plan section) Acute Rehab OT Goals Patient Stated Goal: none stated  OT Frequency:     Barriers to D/C:            Co-evaluation PT/OT/SLP Co-Evaluation/Treatment: Yes Reason for Co-Treatment: Necessary  to address cognition/behavior during functional activity;For patient/therapist safety PT goals addressed during session: Mobility/safety with mobility;Balance;Strengthening/ROM OT goals addressed during session: ADL's and self-care;Strengthening/ROM      End of Session Equipment Utilized During Treatment: Gait belt Nurse Communication: Mobility status;Need for lift equipment  Activity Tolerance: Patient tolerated treatment well Patient left: in chair;with call bell/phone within reach;with chair alarm set   Time: 1000-1023 OT Time Calculation (min): 23 min Charges:  OT General Charges $OT Visit: 1 Procedure OT Evaluation $Initial OT Evaluation Tier I: 1 Procedure G-Codes:    Cipriano Mile 02/15/2014, 12:00 PM 02/15/2014 Cipriano Mile OTR/L Pager [phone] Office [phone]

## 2014-02-16 DIAGNOSIS — G40909 Epilepsy, unspecified, not intractable, without status epilepticus: Secondary | ICD-10-CM | POA: Diagnosis present

## 2014-02-16 LAB — CBC
HCT: 30.8 % — ABNORMAL LOW (ref 39.0–52.0)
Hemoglobin: 10.3 g/dL — ABNORMAL LOW (ref 13.0–17.0)
MCH: 28.9 pg (ref 26.0–34.0)
MCHC: 33.4 g/dL (ref 30.0–36.0)
MCV: 86.5 fL (ref 78.0–100.0)
PLATELETS: 238 10*3/uL (ref 150–400)
RBC: 3.56 MIL/uL — ABNORMAL LOW (ref 4.22–5.81)
RDW: 13 % (ref 11.5–15.5)
WBC: 3.8 10*3/uL — AB (ref 4.0–10.5)

## 2014-02-16 LAB — GLUCOSE, CAPILLARY
GLUCOSE-CAPILLARY: 212 mg/dL — AB (ref 70–99)
Glucose-Capillary: 175 mg/dL — ABNORMAL HIGH (ref 70–99)
Glucose-Capillary: 158 mg/dL — ABNORMAL HIGH (ref 70–99)
Glucose-Capillary: 166 mg/dL — ABNORMAL HIGH (ref 70–99)
Glucose-Capillary: 167 mg/dL — ABNORMAL HIGH (ref 70–99)
Glucose-Capillary: 172 mg/dL — ABNORMAL HIGH (ref 70–99)

## 2014-02-16 LAB — BASIC METABOLIC PANEL
ANION GAP: 14 (ref 5–15)
BUN: 13 mg/dL (ref 6–23)
CALCIUM: 8.6 mg/dL (ref 8.4–10.5)
CO2: 20 meq/L (ref 19–32)
CREATININE: 1.18 mg/dL (ref 0.50–1.35)
Chloride: 100 mEq/L (ref 96–112)
GFR calc non Af Amer: 57 mL/min — ABNORMAL LOW (ref >90)
GFR, EST AFRICAN AMERICAN: 66 mL/min — AB (ref >90)
Glucose, Bld: 213 mg/dL — ABNORMAL HIGH (ref 70–99)
Potassium: 4.3 mEq/L (ref 3.7–5.3)
Sodium: 134 mEq/L — ABNORMAL LOW (ref 137–147)

## 2014-02-16 LAB — PROTIME-INR
INR: 1.91 — ABNORMAL HIGH (ref 0.00–1.49)
PROTHROMBIN TIME: 21.9 s — AB (ref 11.6–15.2)

## 2014-02-16 MED ORDER — LEVETIRACETAM 500 MG PO TABS
1000.0000 mg | ORAL_TABLET | Freq: Two times a day (BID) | ORAL | Status: DC
  Administered 2014-02-16 – 2014-02-18 (×4): 1000 mg via ORAL
  Filled 2014-02-16 (×6): qty 2

## 2014-02-16 MED ORDER — WARFARIN SODIUM 7.5 MG PO TABS
7.5000 mg | ORAL_TABLET | Freq: Once | ORAL | Status: AC
  Filled 2014-02-16: qty 1

## 2014-02-16 NOTE — Progress Notes (Signed)
Subjective: No new events reported. Patient has remained alert and interactive. No recurrent alterations in mental status no focal motor seizure activity reported since increasing the Keppra 2 days ago.  Objective: Current vital signs: BP 144/85  Pulse 59  Temp(Src) 98.6 F (37 C) (Oral)  Resp 19  Ht 5' 4.96" (1.65 m)  Wt 76.613 kg (168 lb 14.4 oz)  BMI 28.14 kg/m2  SpO2 99%  Neurologic Exam: Patient was alert and in no acute distress. He was disoriented to his correct age but oriented to place and current month. He followed commands well. Speech was slightly slurred. Patient was able to move extremities equally with good strength throughout.  Medications: I have reviewed the patient's current medications.  Assessment/Plan: 80 year old man with history of stroke with new onset seizure disorder. Seizures appear to be controlled at this point on Keppra 1000 mg twice a day.  Recommend no changes in current management. Patient will need a neurology outpatient followup appointment following discharge.  I will plan to see him in followup on an as-needed basis the remainder of his hospital stay, unless patient has a recurrence of seizure activity or significant change in mental status.  C.R. Roseanne Reno, MD [facility] [phone]  02/16/2014  9:22 AM

## 2014-02-16 NOTE — Clinical Social Work Note (Signed)
CSW continues to follow for discharge planning needs. CSW made aware by MD not ready for discharge. CSW contacted [facility] and spoke with Darel Hong who reported facility has not received insurance authorization and cannot be admitted until 9/20. CSW made MD aware. Patient's daughter made aware. CSW to continue to follow for discharge planning needs.   Delando Satter [place], LCSWA Weekend Clinical Social Worker [phone]

## 2014-02-16 NOTE — Progress Notes (Signed)
ANTICOAGULATION CONSULT NOTE - Follow Up Consult  Pharmacy Consult for Coumadin and Lovenox Indication: DVT  Allergies  Allergen Reactions  . Ramipril     REACTION: Cough    Patient Measurements: Height: 5' 4.96" (165 cm) Weight: 168 lb 14.4 oz (76.613 kg) IBW/kg (Calculated) : 61.41  Vital Signs: Temp: 98.6 F (37 C) (09/19 0749) Temp src: Oral (09/19 0749) BP: 144/85 mmHg (09/19 0749) Pulse Rate: 59 (09/19 0749)  Recent Labs  02/13/14 1115  02/13/14 1847 02/14/14 0545 02/14/14 1645 02/15/14 0530 02/16/14 0445  HGB 11.9*  --   --   --   --  11.2* 10.3*  HCT 35.0*  --   --   --   --  33.4* 30.8*  PLT 262  --   --   --   --  215 238  LABPROT  --   < >  --   --  19.8* 19.8* 21.9*  INR  --   < >  --   --  1.68* 1.68* 1.91*  CREATININE 1.77*  --  1.47* 1.46*  --  1.20 1.18  TROPONINI  --   --  <0.30  --   --   --   --   < > = values in this interval not displayed.  Estimated Creatinine Clearance: 48.5 ml/min (by C-G formula based on Cr of 1.18).  Assessment: 81yom on coumadin pta for recent DVT (confirmed by dopplers 9/2), admitted with AMS. INR on admit was 1.6 - coumadin held as patient unable to swallow and therapeutic lovenox started. He passed his swallow evaluation on 9/17 and coumadin was resumed. INR remains subtherapeutic but now up to 1.91 after two doses of coumadin 7.5mg .  Note, pt received one dose of Levaquin yesterday but then it was discontinued after negative UA.  His weight has decreased since admission, 84.5kg to 76.6kg. Will need to monitor weight and adjust lovenox dose accordingly.  H&H appears to be trending down (10.3/30.8 today) but PLT remain okay.  Will continue to monitor.  Goal of Therapy:  INR 2-3 Monitor platelets by anticoagulation protocol: Yes   Plan:  1) Repeat coumadin 7.5mg  x 1 2) Continue lovenox  (1.5mg /kg) q24 until INR > 2 3) INR and CBC in AM  Waynette Buttery, PharmD Clinical Pharmacy Resident Pager:  [phone] 02/16/2014 8:18 AM

## 2014-02-16 NOTE — Progress Notes (Signed)
Subjective: Pt asleep in bed.  Family denies complaints for the patient just have questions about medications and medical conditions. Pt ate all of breakfast today except for a spoonful.   RN-more alert per the RN   Objective: Vital signs in last 24 hours: Filed Vitals:   02/16/14 0035 02/16/14 0457 02/16/14 0749 02/16/14 1159  BP: 136/62 152/62 144/85 101/51  Pulse: 82 46 59 83  Temp: 99 F (37.2 C) 98.3 F (36.8 C) 98.6 F (37 C) 98.4 F (36.9 C)  TempSrc: Oral Oral Oral Oral  Resp: Height:      Weight:      SpO2: 99% 100% 99% 99%   Weight change:   Intake/Output Summary (Last 24 hours) at 02/16/14 1206 Last data filed at 02/16/14 1154  Gross per 24 hour  Intake    460 ml  Output   1500 ml  Net  -1040 ml   Vitals reviewed. General: resting in bed, NAD HEENT: eyes closed Cardiac: RRR, no rubs, murmurs or gallops Pulm: clear to auscultation bilaterally, no wheezes, rales, or rhonchi Abd: soft, nontender, nondistended, BS present Ext: warm and well perfused, no pedal edema Neuro: asleep so unable to do neuro exam  Lab Results: Basic Metabolic Panel:  Recent Labs Lab 02/15/14 0530 02/16/14 0445  NA 137 134*  K 4.3 4.3  CL 101 100  CO2 22 20  GLUCOSE 106* 213*  BUN 14 13  CREATININE 1.20 1.18  CALCIUM 8.3* 8.6   Liver Function Tests:  Recent Labs Lab 02/13/14 1115  AST 30  ALT 22  ALKPHOS 55  BILITOT 0.5  PROT 8.1  ALBUMIN 3.2*   CBC:  Recent Labs Lab 02/13/14 1115 02/15/14 0530 02/16/14 0445  WBC 5.5 3.6* 3.8*  NEUTROABS 3.3  --   --   HGB 11.9* 11.2* 10.3*  HCT 35.0* 33.4* 30.8*  MCV 84.3 85.6 86.5  PLT 262 215 238   Cardiac Enzymes:  Recent Labs Lab 02/13/14 1847  TROPONINI <0.30   CBG:  Recent Labs Lab 02/15/14 1642 02/15/14 2030 02/16/14 0041 02/16/14 0522 02/16/14 0748 02/16/14 1103  GLUCAP 182* 189* 212* 175* 158* 166*   Coagulation:  Recent Labs Lab 02/13/14 1359 02/14/14 1645 02/15/14 0530  02/16/14 0445  LABPROT 19.3* 19.8* 19.8* 21.9*  INR 1.63* 1.68* 1.68* 1.91*    Urinalysis:  Recent Labs Lab 02/13/14 1220  COLORURINE AMBER*  LABSPEC 1.019  PHURINE 5.5  GLUCOSEU 250*  HGBUR LARGE*  BILIRUBINUR NEGATIVE  KETONESUR 15*  PROTEINUR 100*  UROBILINOGEN 0.2  NITRITE NEGATIVE  LEUKOCYTESUR LARGE*   Misc. Labs: none  Micro Results: Recent Results (from the past 240 hour(s))  URINE CULTURE     Status: None   Collection Time    02/13/14 12:20 PM      Result Value Ref Range Status   Specimen Description URINE, CATHETERIZED   Final   Special Requests NONE   Final   Culture  Setup Time     Final   Value: 02/13/2014 15:36     Performed at [facility] Count     Final   Value: 45,000 COLONIES/ML     Performed at [facility]   Culture     Final   Value: Multiple bacterial morphotypes present, none predominant. Suggest appropriate recollection if clinically indicated.     Performed at [facility]   Report Status 02/14/2014 FINAL   Final   Studies/Results: No  results found. Medications: Scheduled Meds: . aspirin EC  81 mg Oral Daily  . atorvastatin  40 mg Oral q1800  . enoxaparin (LOVENOX) injection  115 mg Subcutaneous Q24H  . feeding supplement (ENSURE)  1 Container Oral TID BM  . FLUoxetine  10 mg Oral Daily  . insulin aspart  0-15 Units Subcutaneous TID WC  . insulin glargine  5 Units Subcutaneous QHS  . levETIRAcetam  1,000 mg Oral BID  . LORazepam  2 mg Intravenous Once  . pantoprazole  40 mg Oral Q24H  . sodium chloride  3 mL Intravenous Q12H  . warfarin  7.5 mg Oral ONCE-1800  . Warfarin - Pharmacist Dosing Inpatient   Does not apply q1800   Continuous Infusions:  PRN Meds:. Assessment/Plan: 80 y.o with acute encephalopathy presented with acute stroke and ?seizures with h/o chronic strokes.  #Acute Stroke -CT brain with no acute changes. MRI brain with sub cm area of acute infarct in left parietal  cortex -ASA  daily, atorvastatin  daily, continue Coumadin  -Neuro signed off.  Needs outpatient neuro f/u   -pending SNF [facility]   #Seizure -EEG wnl though seizure could have been missed as this was not a 24 EEG -Continue Keppra 1000 mg bid will change iv to oral today   #AKI on CKD 2, resolved  #DM2 -lantus 5u daily. Last Hgb A1c 14 01/28/2014.  -SSI-M -Hold metformin   #GERD  -  protonix daily   #Depression -Prozac  #DVT history 01/30/14:  -therapeutic lovenox until INR 2-3. Also on coumadin per pharmacy  #FEN -NSL -still hyponatremia-will trend BMET -dysphagia 2 diet   Dispo: Disposition is deferred at this time, awaiting improvement of current medical problems.  Anticipated discharge in approximately 1 day(s).   The patient will establish with [facility]  The patient does not have transportation limitations that hinder transportation to clinic appointments.  .Services Needed at time of discharge: Y = Yes, Blank = No PT: SNF  OT: SNF  RN:   Equipment:   Other:     LOS: 3 days   Annett Gula, MD [phone] 02/16/2014, 12:06 PM

## 2014-02-17 LAB — GLUCOSE, CAPILLARY
GLUCOSE-CAPILLARY: 187 mg/dL — AB (ref 70–99)
GLUCOSE-CAPILLARY: 273 mg/dL — AB (ref 70–99)
Glucose-Capillary: 185 mg/dL — ABNORMAL HIGH (ref 70–99)
Glucose-Capillary: 217 mg/dL — ABNORMAL HIGH (ref 70–99)
Glucose-Capillary: 205 mg/dL — ABNORMAL HIGH (ref 70–99)

## 2014-02-17 LAB — BASIC METABOLIC PANEL
Anion gap: 12 (ref 5–15)
BUN: 12 mg/dL (ref 6–23)
CO2: 23 meq/L (ref 19–32)
Calcium: 8.5 mg/dL (ref 8.4–10.5)
Chloride: 101 mEq/L (ref 96–112)
Creatinine, Ser: 1.21 mg/dL (ref 0.50–1.35)
GFR calc Af Amer: 64 mL/min — ABNORMAL LOW (ref >90)
GFR, EST NON AFRICAN AMERICAN: 55 mL/min — AB (ref >90)
GLUCOSE: 237 mg/dL — AB (ref 70–99)
POTASSIUM: 4.2 meq/L (ref 3.7–5.3)
Sodium: 136 mEq/L — ABNORMAL LOW (ref 137–147)

## 2014-02-17 LAB — PROTIME-INR
INR: 1.89 — AB (ref 0.00–1.49)
Prothrombin Time: 21.7 seconds — ABNORMAL HIGH (ref 11.6–15.2)

## 2014-02-17 LAB — CBC
HEMATOCRIT: 29.7 % — AB (ref 39.0–52.0)
HEMOGLOBIN: 9.9 g/dL — AB (ref 13.0–17.0)
MCH: 28.4 pg (ref 26.0–34.0)
MCHC: 33.3 g/dL (ref 30.0–36.0)
MCV: 85.1 fL (ref 78.0–100.0)
Platelets: 249 10*3/uL (ref 150–400)
RBC: 3.49 MIL/uL — ABNORMAL LOW (ref 4.22–5.81)
RDW: 13.1 % (ref 11.5–15.5)
WBC: 3.4 10*3/uL — ABNORMAL LOW (ref 4.0–10.5)

## 2014-02-17 MED ORDER — INSULIN GLARGINE 100 UNIT/ML ~~LOC~~ SOLN
10.0000 [IU] | Freq: Every day | SUBCUTANEOUS | Status: DC
  Administered 2014-02-17: 10 [IU] via SUBCUTANEOUS
  Filled 2014-02-17 (×2): qty 0.1

## 2014-02-17 MED ORDER — WARFARIN SODIUM 7.5 MG PO TABS
7.5000 mg | ORAL_TABLET | Freq: Once | ORAL | Status: AC
  Administered 2014-02-17: 7.5 mg via ORAL
  Filled 2014-02-17: qty 1

## 2014-02-17 NOTE — Clinical Social Work Note (Signed)
CSW continues to follow this patient for d/c planning needs. CSW made aware patient not ready for d/c on this date. CSW made facility aware. CSW to follow tomorrow.  Aniqua Briere Patrick-Jefferson, LCSWA Weekend Clinical Social Worker [phone]

## 2014-02-17 NOTE — Progress Notes (Signed)
ANTICOAGULATION CONSULT NOTE - Follow Up Consult  Pharmacy Consult for Coumadin and Lovenox Indication: DVT  Allergies  Allergen Reactions  . Ramipril     REACTION: Cough    Patient Measurements: Height: 5' 4.96" (165 cm) Weight: 168 lb 14.4 oz (76.613 kg) IBW/kg (Calculated) : 61.41  Vital Signs: Temp: 98.3 F (36.8 C) (09/20 1201) Temp src: Oral (09/20 1201) BP: 127/60 mmHg (09/20 1201) Pulse Rate: 80 (09/20 1201)  Recent Labs  02/15/14 0530 02/16/14 0445 02/17/14 0322  HGB 11.2* 10.3* 9.9*  HCT 33.4* 30.8* 29.7*  PLT 215 238 249  LABPROT 19.8* 21.9* 21.7*  INR 1.68* 1.91* 1.89*  CREATININE 1.20 1.18 1.21    Estimated Creatinine Clearance: 47.3 ml/min (by C-G formula based on Cr of 1.21).  Assessment: 81yom on coumadin pta for recent DVT (confirmed by dopplers 9/2), admitted with AMS. INR on admit was 1.6 - coumadin held as patient unable to swallow and therapeutic lovenox started. He passed his swallow evaluation on 9/17 and coumadin was resumed. INR remains subtherapeutic at 1.89.  **Coumadin not documented as administered 9/19 although it was ordered**  His weight has decreased since admission, 84.5kg to 76.6kg. Will need to monitor weight and adjust lovenox dose accordingly.  H&H appears to be trending down (9.9/29.7 today) but PLT remain okay.  Will continue to monitor.  Goal of Therapy:  INR 2-3 Monitor platelets by anticoagulation protocol: Yes   Plan:  1) Coumadin 7.5mg  x 1 2) Continue lovenox  (1.5mg /kg) q24 until INR > 2 3) INR and CBC in AM  Waynette Buttery, PharmD Clinical Pharmacy Resident Pager: [phone] 02/17/2014 2:25 PM

## 2014-02-17 NOTE — Progress Notes (Signed)
Subjective: [name] awoke to voice this morning and was more interactive. He reports being hungry. No other complaints otherwise.   Objective: Vital signs in last 24 hours: Filed Vitals:   02/16/14 1604 02/16/14 2100 02/17/14 0000 02/17/14 0829  BP: 130/78 123/44 126/52 134/90  Pulse: 102 87 88 45  Temp: 98.3 F (36.8 C) 99.1 F (37.3 C) 98.8 F (37.1 C) 98.5 F (36.9 C)  TempSrc: Oral   Oral  Resp: Height:      Weight:      SpO2: 100% 98% 98% 98%   Weight change:   Intake/Output Summary (Last 24 hours) at 02/17/14 4098 Last data filed at 02/16/14 2100  Gross per 24 hour  Intake    340 ml  Output   1800 ml  Net  -1460 ml   General Apperance: NAD  Head: Normocephalic, atraumatic  Eyes: PERRL, anicteric sclera  Ears: Normal external ear canal  Nose: Nares normal, septum midline, mucosa normal  Throat: Lips, mucosa normal  Neck: Supple, trachea midline  Back: No tenderness or bony abnormality  Lungs: Clear to auscultation bilaterally, no w/r/r Heart: Irregularly irregular, no m/r/g Chest Wall: Nontender, no deformities Abdomen: Soft, nontender, nondistended, no rebound/guarding  Extremities: Normal, atraumatic, warm and well perfused, no edema  Pulses: 2+ throughout  Skin: No rashes or lesions  Neurologic: Arousable to voice. Alert. Responding to most questions. Following commands. RUE remains flaccid. LUE grip strength 5/5. Moving LE spontaneously.  Lab Results: Basic Metabolic Panel:  Recent Labs Lab 02/16/14 0445 02/17/14 0322  NA 134* 136*  K 4.3 4.2  CL 100 101  CO2 20 23  GLUCOSE 213* 237*  BUN 13 12  CREATININE 1.18 1.21  CALCIUM 8.6 8.5   Liver Function Tests:  Recent Labs Lab 02/13/14 1115  AST 30  ALT 22  ALKPHOS 55  BILITOT 0.5  PROT 8.1  ALBUMIN 3.2*   CBC:  Recent Labs Lab 02/13/14 1115  02/16/14 0445 02/17/14 0322  WBC 5.5  < > 3.8* 3.4*  NEUTROABS 3.3  --   --   --   HGB 11.9*  < > 10.3* 9.9*  HCT  35.0*  < > 30.8* 29.7*  MCV 84.3  < > 86.5 85.1  PLT 262  < > 238 249  < > = values in this interval not displayed. Cardiac Enzymes:  Recent Labs Lab 02/13/14 1847  TROPONINI <0.30   CBG:  Recent Labs Lab 02/16/14 0748 02/16/14 1103 02/16/14 1602 02/16/14 1944 02/17/14 0035 02/17/14 0748  GLUCAP 158* 166* 167* 172* 273* 185*   Coagulation:  Recent Labs Lab 02/14/14 1645 02/15/14 0530 02/16/14 0445 02/17/14 0322  LABPROT 19.8* 19.8* 21.9* 21.7*  INR 1.68* 1.68* 1.91* 1.89*   Urinalysis:  Recent Labs Lab 02/13/14 1220  COLORURINE AMBER*  LABSPEC 1.019  PHURINE 5.5  GLUCOSEU 250*  HGBUR LARGE*  BILIRUBINUR NEGATIVE  KETONESUR 15*  PROTEINUR 100*  UROBILINOGEN 0.2  NITRITE NEGATIVE  LEUKOCYTESUR LARGE*   Micro Results: Recent Results (from the past 240 hour(s))  URINE CULTURE     Status: None   Collection Time    02/13/14 12:20 PM      Result Value Ref Range Status   Specimen Description URINE, CATHETERIZED   Final   Special Requests NONE   Final   Culture  Setup Time     Final   Value: 02/13/2014 15:36     Performed at [facility]  Count     Final   Value: 45,000 COLONIES/ML     Performed at [facility]   Culture     Final   Value: Multiple bacterial morphotypes present, none predominant. Suggest appropriate recollection if clinically indicated.     Performed at [facility]   Report Status 02/14/2014 FINAL   Final   Studies/Results: No results found. Medications: I have reviewed the patient's current medications. Scheduled Meds: . aspirin EC  81 mg Oral Daily  . atorvastatin  40 mg Oral q1800  . enoxaparin (LOVENOX) injection  115 mg Subcutaneous Q24H  . feeding supplement (ENSURE)  1 Container Oral TID BM  . FLUoxetine  10 mg Oral Daily  . insulin aspart  0-15 Units Subcutaneous TID WC  . insulin glargine  5 Units Subcutaneous QHS  . levETIRAcetam  1,000 mg Oral BID  . LORazepam  2 mg Intravenous Once    . pantoprazole  40 mg Oral Q24H  . sodium chloride  3 mL Intravenous Q12H  . warfarin  7.5 mg Oral ONCE-1800  . Warfarin - Pharmacist Dosing Inpatient   Does not apply q1800   Continuous Infusions:   PRN Meds:. Assessment/Plan: Principal Problem:   Stroke Active Problems:   DIABETES MELLITUS, TYPE II   HYPERCHOLESTEROLEMIA   DEMENTIA   DEPRESSION   HYPERTENSION   Hyponatremia   History of stroke   Mild malnutrition   Altered mental status   Acute encephalopathy   Seizure disorder  Acute Stroke, ?Seizure: CT brain with no acute changes. MRI brain with sub cm area of acute infarct in left parietal cortex. EEG wnl. SLP evaluated and recommend dysphagia 2 diet. PT/OT recommending SNF. Daughter agrees to SNF placement as long as one can be found in [place]. -Keppra 1000 BID -ASA  daily -atorvastatin  daily -Outpatient follow up with neurology -Continue coumadin  UTI: He received rocephin 1g in the ED. Continued Levaquin. Urine culture resulted with no predominant organisms.  Hyponatremia: He is hyponatremic to 129 on admission - likely 2/2 poor PO intake. 136 this morning. Resolving with increased PO intake.  Depression: previously on Prozac. Family endorses he was better on prozac -Continue Prozac  daily  AKI on CKD stage 2: Cr 1.77 at admission. Baseline around 1.3. Likely pre-renal 2/2 decreased PO intake. Down to 1.2. Resolved.  EKG changes: sinus arrythmia with nonspecific t wave changes. Troponin negative. -continue to monitor  DM2: On metformin  daily and lantus 15u daily. Last Hgb A1c 14 01/28/2014.  -SSI  -Lantus 5u QHS  -Hold metformin   GERD  -  protonix daily   DVT: INR subtherapeutic -therapeutic lovenox  -continue coumadin  FEN:  -dysphagia 2 diet -HLIVF  Dispo: Awaiting SNF placement  The patient does have a current PCP Romero Belling, MD) and does not need an [facility] hospital follow-up appointment after discharge.  The  patient does not have transportation limitations that hinder transportation to clinic appointments.  .Services Needed at time of discharge: Y = Yes, Blank = No PT:   OT:   RN: SNF  Equipment: Wheelchair, wheelchair cusion, hospital bed, hoyer lift  Other:     LOS: 4 days   Griffin Basil, MD 02/17/2014, 9:07 AM

## 2014-02-18 ENCOUNTER — Ambulatory Visit: Payer: Medicare PPO | Admitting: Internal Medicine

## 2014-02-18 LAB — BASIC METABOLIC PANEL
Anion gap: 14 (ref 5–15)
BUN: 13 mg/dL (ref 6–23)
CHLORIDE: 96 meq/L (ref 96–112)
CO2: 24 meq/L (ref 19–32)
Calcium: 9.1 mg/dL (ref 8.4–10.5)
Creatinine, Ser: 1.24 mg/dL (ref 0.50–1.35)
GFR calc Af Amer: 62 mL/min — ABNORMAL LOW (ref >90)
GFR calc non Af Amer: 54 mL/min — ABNORMAL LOW (ref >90)
Glucose, Bld: 250 mg/dL — ABNORMAL HIGH (ref 70–99)
POTASSIUM: 4.2 meq/L (ref 3.7–5.3)
SODIUM: 134 meq/L — AB (ref 137–147)

## 2014-02-18 LAB — CBC
HEMATOCRIT: 34.6 % — AB (ref 39.0–52.0)
Hemoglobin: 11.5 g/dL — ABNORMAL LOW (ref 13.0–17.0)
MCH: 28.5 pg (ref 26.0–34.0)
MCHC: 33.2 g/dL (ref 30.0–36.0)
MCV: 85.9 fL (ref 78.0–100.0)
Platelets: 278 10*3/uL (ref 150–400)
RBC: 4.03 MIL/uL — ABNORMAL LOW (ref 4.22–5.81)
RDW: 13.1 % (ref 11.5–15.5)
WBC: 4.8 10*3/uL (ref 4.0–10.5)

## 2014-02-18 LAB — GLUCOSE, CAPILLARY
GLUCOSE-CAPILLARY: 310 mg/dL — AB (ref 70–99)
Glucose-Capillary: 227 mg/dL — ABNORMAL HIGH (ref 70–99)
Glucose-Capillary: 148 mg/dL — ABNORMAL HIGH (ref 70–99)

## 2014-02-18 LAB — PROTIME-INR
INR: 1.67 — ABNORMAL HIGH (ref 0.00–1.49)
Prothrombin Time: 19.7 seconds — ABNORMAL HIGH (ref 11.6–15.2)

## 2014-02-18 MED ORDER — ASPIRIN 81 MG PO TBEC: 81.0000 mg | DELAYED_RELEASE_TABLET | Freq: Every day | ORAL | Status: AC

## 2014-02-18 MED ORDER — WARFARIN SODIUM 5 MG PO TABS: ORAL_TABLET | ORAL | Status: AC

## 2014-02-18 MED ORDER — INSULIN GLARGINE 100 UNIT/ML ~~LOC~~ SOLN
15.0000 [IU] | Freq: Every day | SUBCUTANEOUS | Status: DC
  Filled 2014-02-18: qty 0.15

## 2014-02-18 MED ORDER — LEVETIRACETAM 1000 MG PO TABS: 1000.0000 mg | ORAL_TABLET | Freq: Two times a day (BID) | ORAL | Status: DC

## 2014-02-18 MED ORDER — ENOXAPARIN SODIUM 120 MG/0.8ML ~~LOC~~ SOLN: SUBCUTANEOUS | Status: DC

## 2014-02-18 MED ORDER — ENSURE PUDDING PO PUDG: 1.0000 | Freq: Three times a day (TID) | ORAL | Status: DC

## 2014-02-18 MED ORDER — FLUOXETINE HCL 10 MG PO CAPS: 10.0000 mg | ORAL_CAPSULE | Freq: Every day | ORAL | Status: DC

## 2014-02-18 MED ORDER — WARFARIN SODIUM 7.5 MG PO TABS
7.5000 mg | ORAL_TABLET | Freq: Once | ORAL | Status: AC
  Administered 2014-02-18: 7.5 mg via ORAL
  Filled 2014-02-18: qty 1

## 2014-02-18 NOTE — Progress Notes (Signed)
Inpatient Diabetes Program Recommendations  AACE/ADA: New Consensus Statement on Inpatient Glycemic Control (2013)  Target Ranges:  Prepandial:   less than 140 mg/dL      Peak postprandial:   less than 180 mg/dL (1-2 hours)      Critically ill patients:  140 - 180 mg/dL     Results for DAXSON, REFFETT (MRN [id number]) as of 02/18/2014 10:14  Ref. Range 02/17/2014 07:48 02/17/2014 11:30 02/17/2014 16:20 02/17/2014 20:21  Glucose-Capillary Latest Range: 70-99 mg/dL 102 (H) 725 (H) 366 (H) 187 (H)    Results for WEYLYN, RICCIUTI (MRN [id number]) as of 02/18/2014 10:14  Ref. Range 02/18/2014 08:33  Glucose-Capillary Latest Range: 70-99 mg/dL 956 (H)     Home DM Meds: Lantus 15 units daily + Metformin 500 mg daily  Current Meds: Lantus 10 units QHS + Novolog Moderate SSI    MD- Fasting glucose remains elevated.  Please consider increasing Lantus to 15 units QHS (home dose)    Will follow Ambrose Finland RN, MSN, CDE Diabetes Coordinator Inpatient Diabetes Program Team Pager: [phone] (8a-10p)

## 2014-02-18 NOTE — Progress Notes (Signed)
Subjective: [name] awake and interactive this morning. No new complaints. He reports he is hungry.  Objective: Vital signs in last 24 hours: Filed Vitals:   02/17/14 2000 02/18/14 0530 02/18/14 0759 02/18/14 1146  BP: 177/73 148/48 168/73 135/68  Pulse: 93 47 48 91  Temp: 99.6 F (37.6 C) 98.3 F (36.8 C) 99.3 F (37.4 C) 98.9 F (37.2 C)  TempSrc: Oral Oral Oral Oral  Resp: Height:      Weight:  169 lb 12.1 oz (77 kg)    SpO2: 98% 98% 98% 99%   Weight change:   Intake/Output Summary (Last 24 hours) at 02/18/14 1347 Last data filed at 02/18/14 0600  Gross per 24 hour  Intake    240 ml  Output    700 ml  Net   -460 ml   General Apperance: NAD  Head: Normocephalic, atraumatic  Eyes: PERRL, anicteric sclera  Ears: Normal external ear canal  Nose: Nares normal, septum midline, mucosa normal  Throat: Lips, mucosa normal  Neck: Supple, trachea midline  Back: No tenderness or bony abnormality  Lungs: Clear to auscultation bilaterally, no w/r/r Heart: Irregularly irregular, no m/r/g Chest Wall: Nontender, no deformities Abdomen: Soft, nontender, nondistended, no rebound/guarding  Extremities: Normal, atraumatic, warm and well perfused, no edema  Pulses: 2+ throughout  Skin: No rashes or lesions  Neurologic: Arousable to voice. Alert. Responding to most questions. Following commands but with poor effort most of the time. RUE moving spontaneously. LUE grip strength 5/5. Moving LE spontaneously.  Lab Results: Basic Metabolic Panel:  Recent Labs Lab 02/17/14 0322 02/18/14 0945  NA 136* 134*  K 4.2 4.2  CL 101 96  CO2 23 24  GLUCOSE 237* 250*  BUN 12 13  CREATININE 1.21 1.24  CALCIUM 8.5 9.1   Liver Function Tests:  Recent Labs Lab 02/13/14 1115  AST 30  ALT 22  ALKPHOS 55  BILITOT 0.5  PROT 8.1  ALBUMIN 3.2*   CBC:  Recent Labs Lab 02/13/14 1115  02/17/14 0322 02/18/14 0945  WBC 5.5  < > 3.4* 4.8  NEUTROABS 3.3  --   --   --    HGB 11.9*  < > 9.9* 11.5*  HCT 35.0*  < > 29.7* 34.6*  MCV 84.3  < > 85.1 85.9  PLT 262  < > 249 278  < > = values in this interval not displayed. Cardiac Enzymes:  Recent Labs Lab 02/13/14 1847  TROPONINI <0.30   CBG:  Recent Labs Lab 02/17/14 0748 02/17/14 1130 02/17/14 1620 02/17/14 2021 02/18/14 0833 02/18/14 1148  GLUCAP 185* 217* 205* 187* 227* 310*   Coagulation:  Recent Labs Lab 02/15/14 0530 02/16/14 0445 02/17/14 0322 02/18/14 0945  LABPROT 19.8* 21.9* 21.7* 19.7*  INR 1.68* 1.91* 1.89* 1.67*   Urinalysis:  Recent Labs Lab 02/13/14 1220  COLORURINE AMBER*  LABSPEC 1.019  PHURINE 5.5  GLUCOSEU 250*  HGBUR LARGE*  BILIRUBINUR NEGATIVE  KETONESUR 15*  PROTEINUR 100*  UROBILINOGEN 0.2  NITRITE NEGATIVE  LEUKOCYTESUR LARGE*   Micro Results: Recent Results (from the past 240 hour(s))  URINE CULTURE     Status: None   Collection Time    02/13/14 12:20 PM      Result Value Ref Range Status   Specimen Description URINE, CATHETERIZED   Final   Special Requests NONE   Final   Culture  Setup Time     Final   Value: 02/13/2014 15:36  Performed at [facility] Count     Final   Value: 45,000 COLONIES/ML     Performed at [facility]   Culture     Final   Value: Multiple bacterial morphotypes present, none predominant. Suggest appropriate recollection if clinically indicated.     Performed at [facility]   Report Status 02/14/2014 FINAL   Final   Medications: I have reviewed the patient's current medications. Scheduled Meds: . aspirin EC  81 mg Oral Daily  . atorvastatin  40 mg Oral q1800  . enoxaparin (LOVENOX) injection  115 mg Subcutaneous Q24H  . feeding supplement (ENSURE)  1 Container Oral TID BM  . FLUoxetine  10 mg Oral Daily  . insulin aspart  0-15 Units Subcutaneous TID WC  . insulin glargine  10 Units Subcutaneous QHS  . levETIRAcetam  1,000 mg Oral BID  . LORazepam  2 mg Intravenous Once    . pantoprazole  40 mg Oral Q24H  . sodium chloride  3 mL Intravenous Q12H  . warfarin  7.5 mg Oral ONCE-1800  . Warfarin - Pharmacist Dosing Inpatient   Does not apply q1800   Continuous Infusions:   PRN Meds:. Assessment/Plan: Principal Problem:   Stroke Active Problems:   DIABETES MELLITUS, TYPE II   HYPERCHOLESTEROLEMIA   DEMENTIA   DEPRESSION   HYPERTENSION   Hyponatremia   History of stroke   Mild malnutrition   Altered mental status   Acute encephalopathy   Seizure disorder  Acute Stroke, likely Seizure: -Keppra 1000 BID -ASA  daily -atorvastatin  daily -Outpatient follow up with neurology -Continue coumadin  UTI: He received rocephin 1g in the ED. Continued Levaquin. Urine culture resulted with no predominant organisms.  Hyponatremia: He is hyponatremic to 129 on admission - likely 2/2 poor PO intake. NA 134 this morning. Likely around baseline chronic hyponatremia.  Depression:  -Continue Prozac  daily  AKI on CKD stage 2: Cr 1.77 at admission. Baseline around 1.3. Likely pre-renal 2/2 decreased PO intake. Down to 1.2. Resolved.  EKG changes: sinus arrythmia with nonspecific t wave changes. Troponin negative.  -continue to monitor  DM2: On metformin  daily and lantus 15u daily. Last Hgb A1c 14 01/28/2014. BG 227 to 310 -SSI  -Lantus 15u QHS  -Hold metformin   GERD  -  protonix daily   DVT: INR subtherapeutic -therapeutic lovenox  -continue coumadin  FEN:  -dysphagia 2 diet -HLIVF  Dispo: Awaiting SNF placement  The patient does have a current PCP Romero Belling, MD) and does not need an [facility] hospital follow-up appointment after discharge.  The patient does not have transportation limitations that hinder transportation to clinic appointments.  .Services Needed at time of discharge: Y = Yes, Blank = No PT:   OT:   RN: SNF  Equipment: Wheelchair, wheelchair cusion, hospital bed, hoyer lift  Other:     LOS: 5 days    Griffin Basil, MD 02/18/2014, 1:47 PM

## 2014-02-18 NOTE — Discharge Instructions (Signed)
Ischemic Stroke Blood carries oxygen to all areas of your body. A stroke happens when your blood does not flow to your brain like normal. If this happens, your brain will not get the oxygen it needs and brain tissue will die. This is an emergency. Problems (symptoms) of a stroke usually happen suddenly. You may notice them when you wake up. They can include:  Loss of feeling or weakness on one side of the body (face, arm, leg).  Feeling confused.  Trouble talking or understanding.  Trouble seeing.  Trouble walking.  Feeling dizzy.  Loss of balance or coordination.  Severe headache without a cause.  Trouble reading or writing. Get help as soon as any of these problems first start. This is important.  RISK FACTORS  Risk factors are things that make it more likely for you to have a stroke. These things include:  High blood pressure (hypertension).  High cholesterol.  Diabetes.  Heart disease.  Having a buildup of fatty deposits in the blood vessels.  Having an abnormal heart rhythm (atrial fibrillation).  Being very overweight (obese).  Smoking.  Taking birth control pills, especially if you smoke.  Not being active.  Having a diet high in fats, salt, and calories.  Drinking too much alcohol.  Using illegal drugs.  Being African American.  Being over the age of 48.  Having a family history of stroke.  Having a history of blood clots, stroke, warning stroke (transient ischemic attack, TIA), or heart attack.  Sickle cell disease. HOME CARE  Take all medicines exactly as told by your doctor. Understand all your medicine instructions.  You may need to take a medicine to thin your blood, like aspirin or warfarin. Take warfarin exactly as told.  Taking too much or too little warfarin is dangerous. Get regular blood tests as told, including the PT and INR tests. The test results help your doctor adjust your dose of warfarin. Your PT and INR levels must be done  as often as told by your doctor.  Food can cause problems with warfarin and affect the results of your blood tests. This is true for foods high in vitamin K, such as spinach, kale, broccoli, cabbage, collard and turnip greens, Brussels sprouts, peas, cauliflower, seaweed, and parsley, as well as beef and pork liver, green tea, and soybean oil. Eat the same amount of food high in vitamin K. Avoid major changes in your diet. Tell your doctor before changing your diet. Talk to a food specialist (dietitian) if you have questions.  Many medicines can cause problems with warfarin and affect your PT and INR test results. Tell your doctor about all medicines you take. This includes vitamins and dietary pills (supplements). Be careful with aspirin and medicines that relieve redness, soreness, and puffiness (inflammation). Do not take or stop medicines unless your doctor tells you to.  Warfarin can cause a lot of bruising or bleeding. Hold pressure over cuts for longer than normal. Talk to your doctor about other side effects of warfarin.  Avoid sports or activities that may cause injury or bleeding.  Be careful when you shave, floss your teeth, or use sharp objects.  Avoid alcoholic drinks or drink very little alcohol while taking warfarin. Tell your doctor if you change how much alcohol you drink.  Tell your dentist and other doctors that you take warfarin before procedures.  If you are able to swallow, eat healthy foods. Eat 5 or more servings of fruits and vegetables a day. Eat soft  foods, pureed foods, or eat small bites of food so you do not choke.  Follow your diet program as told, if you are given one.  Keep a healthy weight.  Stay active. Try to get at least 30 minutes of activity on most or all days.  Do not smoke.  Limit how much alcohol you drink even if you are not taking warfarin. Moderate alcohol use is:  No more than 2 drinks each day for men.  No more than 1 drink each day for  women who are not pregnant.  Keep your home safe so you do not fall. Try:  Putting grab bars in the bedroom and bathroom.  Raising toilet seats.  Putting a seat in the shower.  Go to therapy sessions (physical, occupational, and speech) as told by your doctor.  Use a walker or cane at all times if told to do so.  Keep all doctor visits as told. GET HELP IF:  Your personality changes.  You have trouble swallowing.  You are seeing two of everything.  You are dizzy.  You have a fever.  Your skin starts to break down. GET HELP RIGHT AWAY IF:  The symptoms below may be a sign of an emergency. Do not wait to see if the symptoms go away. Call for help (911 in [place] Do not drive yourself to the hospital.  You have sudden weakness or numbness on the face, arm, or leg (especially on one side of the body).  You have sudden trouble walking or moving your arms or legs.  You have sudden confusion.  You have trouble talking or understanding.  You have sudden trouble seeing in one or both eyes.  You lose your balance or your movements are not smooth.  You have a sudden, severe headache with no known cause.  You have new chest pain or you feel your heart beating in an unsteady way.  You are partly or totally unaware of what is going on around you. Document Released: 05/06/2011 Document Revised: 10/01/2013 Document Reviewed: 12/26/2011 [facility] Patient Information 2015 [place], [place]. This information is not intended to replace advice given to you by your health care provider. Make sure you discuss any questions you have with your health care provider.   Dysphagia Level 2 Diet, Mechanically Altered The dysphagia level 2 diet includes foods that are blended, chopped, ground, or mashed so they are easier to chew and swallow. The foods are soft, moist, and can be chopped into -inch chunks (such as pancakes, pasta, and bananas). In order to be on this diet, you must be able to  chew. This diet helps you transition between the pureed textures of the dysphagia level 1 diet to more solid textures. This diet is helpful for people with mild to moderate swallowing difficulties. It reduces the risk of food getting caught in the windpipe, trachea, or lungs.  You may need help or supervision during meals while following this diet so that you eat safely. You will be on this diet until your health care provider advances the texture of your diet.  WHAT DO I NEED TO KNOW ABOUT THIS DIET? Foods  You may eat foods that are soft and moist.  You may need to use a blender, whisk, or masher to soften some of your foods.  You can moisten foods with gravies, sauces, vegetable or fruit juice, milk, half and half, or water when blending, mashing, or grinding your foods to the right consistency.  If you were on  the dysphagia level 1 diet, you may still eat any of the foods included in that diet.  Avoid foods that are dry, hard, sticky, chewy, coarse, and crunchy. Also avoid large cuts of food.  Take small bites. Each bite should contain  inch or less of food. Liquids  Avoid liquids with seeds and chunks.  Thicken liquids, if instructed by your health care provider. Your health care provider will tell you the consistency to which you should thicken your liquids for safe swallowing. To thicken a liquid, use a commercial thickener or a thickening food (such as rice cereal or potato flakes). Ask your health care provider for specific recommendations on thickeners. See your dietitian or health care provider regularly for help with your dietary changes. WHAT FOODS CAN I EAT? Grains Store-bought soft breads that do not have nuts or seeds. Pancakes, sweet rolls, [place] pastries, and [place] toast that have been moistened with syrup or sauce to form a slurry when blended. Well-cooked pasta, noodles, and bread dressing. Well-cooked noodles and pasta in sauce. Moist macaroni and cheese. Soft  dumplings or spaetzle with gravy or butter. Cooked cereals (including oatmeal). Low-texture dry cereals, such as rice puff, corn, or wheat-flake cereals, with milk (if thin liquids are not allowed, make sure all of the milk is absorbed by the cereal before eating it). Vegetables Very soft, well-cooked vegetables in pieces less than  inch in size. Cooked potatoes that are moist, not crispy, and with sauce. Fruits Canned or cooked fruits that are soft or moist and do not have skin or seeds. Fresh, soft bananas. Fruit juices with a small amount of pulp (if thin liquids are allowed). Gelatin or plain gelatin with canned fruit, except pineapple. Meat and Other Protein Sources Tender, moist meats, poultry, or fish cooked with gravy or sauce and cubed to -inch bites or smaller. Ground meat. Moist meatball or meatloaf. Fish without bones. Moist casseroles without rice. Tuna, egg, or meat salad without chunks or hard-to-chew vegetables, such as celery and onions. Smooth quiche without large chunks. Scrambled, poached, or soft-cooked eggs with butter, margarine, sauce, or gravy. Tofu. Well-cooked, moistened and mashed beans, peas, baked beans, and other legumes. Casseroles without rice (such as tuna noodle casserole or soft moist meat lasagna). Dairy Cream cheese. Yogurt. Cottage cheese. Ask your health care provider if milk is allowed. Sweets/Desserts Pudding. Custard. Soft fruit pies with crust on the bottom only. Crisps and cobblers without seeds or nuts and with soft crusts. Soft, moist cakes. Icing. Pre-gelled cookies. Soft, moist cookies dunked in milk, coffee, or another liquid. Jelly. Soft, smooth chocolate bars that are easily chewed. Jams and preserves without seeds. Ask your health care provider whether you can have frozen desserts. Fats and Oils Butter. Margarine. Cream for cereal, depending on liquid consistency allowed. Gravy. Cream sauces. Mayonnaise. Salad dressings. Cream cheese. Cheese spreads,  plain or with soft fruits or vegetables added. Sour cream. Sour cream dips with soft fruits or vegetables added. Whipped toppings. Other Sauces and salsas that have soft chunks that are about  inch or smaller. The items listed above may not be a complete list of recommended foods or beverages. Contact your dietitian for more options. WHAT FOODS ARE NOT RECOMMENDED? Grains All breads not listed in the recommended list. Breads that are hard or have nuts or seeds. Coarse cereals. Cereals that have nuts, seeds, dried fruits, or coconut. Rice. Corn. Vegetables Whole, raw, frozen, or dried vegetables. Tough, fibrous, chewy, or stringy cooked vegetables, such as celery, peas,  broccoli, cabbage, Brussels sprouts, and asparagus. Potato skins. Potato and other vegetable chips. Fried or French-fried potatoes. Cooked corn and peas. Fruits Whole raw, frozen, or dried fruits, including coconut. Pineapple. Fruits with seeds. Meat and Other Protein Sources Dry, tough meats, such as bacon, sausage, and hot dogs. Cheese slices and cubes. Peanut butter. Hard boiled or fried eggs. Nuts. Seeds. Pizza. Sandwiches. Dry casseroles or casseroles with rice or large chunks. Dairy Yogurt with nuts, seeds, or large chunks. Sweets/Desserts Coarse, hard, chewy, or sticky desserts. Any dessert with nuts, seeds, coconut, pineapple, or dried fruit. Ask your health care provider whether you can have frozen desserts. Fats and Oils Avoid fats with chunky, large textures, such as those with nuts or fruits. Other Soups and casseroles with large chunks. The items listed above may not be a complete list of foods and beverages to avoid. Contact your dietitian for more information. Document Released: 05/17/2005 Document Revised: 01/17/2013 Document Reviewed: 04/30/2013 [facility] Patient Information 2015 [place], [place]. This information is not intended to replace advice given to you by your health care provider. Make sure you discuss  any questions you have with your health care provider.

## 2014-02-18 NOTE — Progress Notes (Signed)
ANTICOAGULATION CONSULT NOTE - Initial Consult  Pharmacy Consult for warfarin, Lovenox Indication: DVT  Allergies  Allergen Reactions  . Ramipril     REACTION: Cough    Patient Measurements: Height: 5' 4.96" (165 cm) Weight: 169 lb 12.1 oz (77 kg) IBW/kg (Calculated) : 61.41 Heparin Dosing Weight: 62 kg  Vital Signs: Temp: 99.3 F (37.4 C) (09/21 0759) Temp src: Oral (09/21 0759) BP: 168/73 mmHg (09/21 0759) Pulse Rate: 48 (09/21 0759)  Labs:  Recent Labs  02/16/14 0445 02/17/14 0322 02/18/14 0945  HGB 10.3* 9.9* 11.5*  HCT 30.8* 29.7* 34.6*  PLT 238 249 278  LABPROT 21.9* 21.7* 19.7*  INR 1.91* 1.89* 1.67*  CREATININE 1.18 1.21 1.24    Estimated Creatinine Clearance: 46.2 ml/min (by C-G formula based on Cr of 1.24).   Medical History: Past Medical History  Diagnosis Date  . Hypertension   . Stroke   . Arthritis   . Diabetes mellitus without complication     insulin dependent  . Hyperglycemia     Medications:  See EMR  Assessment: On warfarin for history of DVT.  Bridging with Lovenox due to persistent subtherapeutic INR.  Current INR 1.67.  Pt missed dose on 9/19.     Goal of Therapy:  INR 2-3 Anti-Xa level 0.6-1 units/ml 4hrs after LMWH dose given Monitor platelets by anticoagulation protocol: Yes   Plan:  Warfarin 7.5 mg po x1 Lovenox 115 mg [place] q24h Daily PT/INR CBC q72h Dc Lovenox when INR >2 Monitor for s/sx bleeding   Agapito Games, PharmD, BCPS Clinical Pharmacist Pager: [phone] 02/18/2014 11:12 AM

## 2014-02-18 NOTE — Care Management Note (Signed)
    Page 1 of 1   02/18/2014     3:44:37 PM CARE MANAGEMENT NOTE 02/18/2014  Patient:  Colin [name],Colin [name]   Account Number:  [id number]  Date Initiated:  02/18/2014  Documentation initiated by:  GRAVES-BIGELOW,Shalondra Wunschel  Subjective/Objective Assessment:   Pt admitted for AMS. Plan to be d/c to SNF.     Action/Plan:   CSW assisting with disposition needs.   Anticipated DC Date:  02/18/2014   Anticipated DC Plan:  SKILLED NURSING FACILITY  In-house referral  Clinical Social Worker      DC Planning Services  CM consult      Choice offered to / List presented to:             Status of service:  Completed, signed off Medicare Important Message given?  YES (If response is "NO", the following Medicare IM given date fields will be blank) Date Medicare IM given:  02/18/2014 Medicare IM given by:  GRAVES-BIGELOW,Korey Prashad Date Additional Medicare IM given:   Additional Medicare IM given by:    Discharge Disposition:  SKILLED NURSING FACILITY  Per UR Regulation:  Reviewed for med. necessity/level of care/duration of stay  If discussed at Long Length of Stay Meetings, dates discussed:   02/19/2014    Comments:

## 2014-02-18 NOTE — Progress Notes (Signed)
CSW (Clinical Child psychotherapist) notified facility that per nursing pt is ready for dc today. Facility still awaiting insurance authorization. As discussed with family during initial assessment, it pt is ready for dc today and insurance authorization not available, pt will dc home with daughter.  Jahn Franchini, LCSWA [phone]

## 2014-02-18 NOTE — Progress Notes (Signed)
CSW (Clinical Child psychotherapist) informed family that Colin [name] has not received insurance authorization. Pt family want to accept bed offer at [facility] and they can accept pt with pending auth. Both facilities notified. CSW prepared pt dc packet and placed with shadow chart. CSW arranged non-emergent ambulance transport. Pt family, pt nurse, and facility informed. CSW signing off.  Siobahn Worsley, LCSWA [phone]

## 2014-02-18 NOTE — Progress Notes (Signed)
Attempted to call report to [facility] in [place] twice.

## 2014-02-18 NOTE — Progress Notes (Signed)
  Date: 02/18/2014  Patient name: Colin [name]  Medical record number: [id number]  Date of birth: May 04, 1933   This patient has been seen and the plan of care was discussed with the house staff. Please see their note for complete details. I concur with their findings with the following additions/corrections:  Colin [name] is stable for D/C to SNF today.  He is improved in his MS and movement.  His mood is also improved per his daughter.  His INR remains subtherapeutic and he will need coumadin and lovenox until INR > 2.   Inez Catalina, MD 02/18/2014, 4:00 PM

## 2014-02-18 NOTE — Discharge Summary (Signed)
Name: Colin [name] MRN: [id number] DOB: 10/30/1932 80 y.o. PCP: Romero Belling, MD  Date of Admission: 02/13/2014 10:47 AM Date of Discharge: 02/18/2014 Attending Physician: Inez Catalina, MD  Discharge Diagnosis: Principal Problem:   Stroke Active Problems:   DIABETES MELLITUS, TYPE II   HYPERCHOLESTEROLEMIA   DEMENTIA   DEPRESSION   HYPERTENSION   Hyponatremia   History of stroke   Mild malnutrition   Altered mental status   Acute encephalopathy   Seizure disorder  Discharge Medications:   Medication List    STOP taking these medications       fondaparinux 7.5 MG/0.6ML Soln injection  Commonly known as:  ARIXTRA      TAKE these medications       aspirin 81 MG EC tablet  Take 1 tablet (81 mg total) by mouth daily.     atorvastatin 40 MG tablet  Commonly known as:  LIPITOR  Take 1 tablet (40 mg total) by mouth daily at 6 PM.     enoxaparin 120 MG/0.8ML injection  Commonly known as:  LOVENOX  115 mg daily until INR 2-3     feeding supplement (ENSURE) Pudg  Take 1 Container by mouth 3 (three) times daily between meals.     FLUoxetine 10 MG capsule  Commonly known as:  PROZAC  Take 1 capsule (10 mg total) by mouth daily.     insulin glargine 100 UNIT/ML injection  Commonly known as:  LANTUS  Inject 0.15 mLs (15 Units total) into the skin daily.     levETIRAcetam 1000 MG tablet  Commonly known as:  KEPPRA  Take 1 tablet (1,000 mg total) by mouth 2 (two) times daily.     metFORMIN 500 MG tablet  Commonly known as:  GLUCOPHAGE  Take 500 mg by mouth daily.     omeprazole 20 MG capsule  Commonly known as:  PRILOSEC  Take 20 mg by mouth daily.     warfarin 5 MG tablet  Commonly known as:  COUMADIN  Please continue to titrate to INR 2-3. Last dose 7.5mg  02/17/2014. Last INR 1.67 on 02/18/2014        Disposition and follow-up:   Colin [name] was discharged from [facility] in Stable condition.  At the hospital follow up  visit please address:  1.  Post stroke seizures and mental status/depression  2.  Lovenox bridging to warfarin for DVT  3.  Labs / imaging needed at time of follow-up: PT/INR, BMP  4.  Pending labs/ test needing follow-up: none  Follow-up Appointments:     Follow-up Information   Follow up with Xu,Jindong, MD On 03/22/2014. (9:30 AM for neurology hospital follow up )    Specialty:  Neurology   Contact information:   [address] [place] [address] [phone]       Follow up with [facility]    . Schedule an appointment as soon as possible for a visit in 1 week. (For hospital follow up and to establish new PCP care)    Contact information:   [address] [place] [address] [phone]      Discharge Instructions: Discharge Instructions   Call MD for:  difficulty breathing, headache or visual disturbances    Complete by:  As directed      Call MD for:  persistant dizziness or light-headedness    Complete by:  As directed      Call MD for:  persistant  nausea and vomiting    Complete by:  As directed      Call MD for:  severe uncontrolled pain    Complete by:  As directed      Call MD for:  temperature >100.4    Complete by:  As directed      Diet - low sodium heart healthy    Complete by:  As directed      Increase activity slowly    Complete by:  As directed            Consultations: Neurology  Procedures Performed:  Dg Chest 1 View  02/13/2014   CLINICAL DATA:  Altered mental status  EXAM: CHEST - 1 VIEW  COMPARISON:  Chest radiograph 02/13/2014.  FINDINGS: Stable cardiac and mediastinal contours status post median sternotomy and CABG procedure. No consolidative pulmonary opacities. Likely chronic left posterior ninth rib fracture. Possible calcified right pulmonary nodule versus overlying ossific density within the lateral aspect of mid right lung. No pleural effusion or pneumothorax.  IMPRESSION:  No acute cardiopulmonary process.   Electronically Signed   By: Annia Belt M.D.   On: 02/13/2014 15:58   Dg Chest 2 View  01/28/2014   CLINICAL DATA:  Shortness of breath and weakness with history of diabetes and previous tobacco use. PA and lateral chest x-ray of May 27, 2010.  EXAM: CHEST  2 VIEW  COMPARISON:  PA and lateral chest of May 27, 2010  FINDINGS: The lungs are adequately inflated and clear. The heart and pulmonary vascularity are normal. The patient has undergone previous CABG. There are 6 intact sternal wires present. There is no pleural effusion. The bony thorax is unremarkable.  IMPRESSION: There is no CHF nor other acute cardiopulmonary abnormality.   Electronically Signed   By: David  [place]   On: 01/28/2014 11:05   Ct Head Wo Contrast  02/13/2014   CLINICAL DATA:  Altered mental status  EXAM: CT HEAD WITHOUT CONTRAST  TECHNIQUE: Contiguous axial images were obtained from the base of the skull through the vertex without intravenous contrast.  COMPARISON:  01/28/2014  FINDINGS: The bony calvarium is intact. No gross soft tissue abnormality is noted. Diffuse atrophic changes are seen. No acute large territory infarct is identified. No hemorrhage or space-occupying mass lesion is noted.  IMPRESSION: Chronic changes without acute abnormality.   Electronically Signed   By: Alcide Clever M.D.   On: 02/13/2014 11:45   Ct Head Wo Contrast  01/28/2014   CLINICAL DATA:  Decreased oral intake, numerous falls, history hypertension, diabetes, stroke  EXAM: CT HEAD WITHOUT CONTRAST  TECHNIQUE: Contiguous axial images were obtained from the base of the skull through the vertex without intravenous contrast.  COMPARISON:  07/25/2009  FINDINGS: Generalized atrophy.  Normal ventricular morphology.  No midline shift or mass effect.  Small vessel chronic ischemic changes of deep cerebral white matter.  Old LEFT basal ganglia and RIGHT thalamic lacunar infarcts.  Small cortical infarct high LEFT  parietal region appear subacute to old in age though new since prior CT.  No intracranial hemorrhage, mass lesion, or additional acute infarction.  Benign-appearing BILATERAL basal ganglia calcifications.  No extra-axial fluid collections.  Visualized paranasal sinuses and mastoid air cells clear.  Bones unremarkable.  IMPRESSION: Small subacute to old high LEFT parietal cortical infarct new since 2011.  Atrophy with small vessel chronic ischemic changes of deep cerebral white matter.  Old lacunar infarcts LEFT basal ganglia and RIGHT thalamus.   Electronically Signed  By: Ulyses Southward M.D.   On: 02-07-14 10:58   Mr Maxine Glenn Head Wo Contrast  02/13/2014   CLINICAL DATA:  Altered mental status. Aphasia. Hypertension diabetes and hypercholesterolemia. Dementia.  EXAM: MRI HEAD WITHOUT CONTRAST  MRA HEAD WITHOUT CONTRAST  TECHNIQUE: Multiplanar, multiecho pulse sequences of the brain and surrounding structures were obtained without intravenous contrast. Angiographic images of the head were obtained using MRA technique without contrast.  COMPARISON:  None.  MRI 02-07-2014  FINDINGS: MRI HEAD FINDINGS  Resolving areas of subacute infarct in the left splenium of the corpus callosum and left posterior body of the corpus callosum as noted previously.  New area of restricted diffusion in the left parietal lobe measuring under 1 cm consistent with acute infarct since the prior MRI. No other areas of acute infarct  Moderate to advanced atrophy. Advanced chronic ischemic changes throughout the white matter. Chronic left parietal infarct. Chronic ischemia in the pons and left cerebellum. Negative for hemorrhage. Negative for mass or edema  MRA HEAD FINDINGS  Both vertebral arteries are patent to the basilar. PICA patent bilaterally. Basilar widely patent. Superior cerebellar arteries are patent bilaterally. Moderate stenosis distal left posterior cerebral artery and severe stenosis distal right posterior cerebral artery   Atherosclerotic disease in the cavernous carotid artery with moderate stenosis on the left and mild stenosis on the right.  Moderate to severe stenosis proximal right A1 segment. Right anterior cerebral artery is patent. Right M1 segment is patent. Moderate disease in the distal right M1 branches  Moderate stenosis in the proximal left A1 segment. Left M1 segment shows mild narrowing proximally. Mild stenosis in the distal left middle cerebral artery branches  Negative for cerebral aneurysm  IMPRESSION: Sub cm area of acute infarct in the left parietal cortex.  Areas of subacute infarction in the left corpus callosum as noted on the recent MRI of Feb 07, 2014  Pronounced atrophy and chronic ischemic changes.  Moderate intracranial atherosclerotic disease without large vessel occlusion.   Electronically Signed   By: Marlan Palau M.D.   On: 02/13/2014 15:46   Mr Brain Wo Contrast  02/13/2014   CLINICAL DATA:  Altered mental status. Aphasia. Hypertension diabetes and hypercholesterolemia. Dementia.  EXAM: MRI HEAD WITHOUT CONTRAST  MRA HEAD WITHOUT CONTRAST  TECHNIQUE: Multiplanar, multiecho pulse sequences of the brain and surrounding structures were obtained without intravenous contrast. Angiographic images of the head were obtained using MRA technique without contrast.  COMPARISON:  None.  MRI 2014-02-07  FINDINGS: MRI HEAD FINDINGS  Resolving areas of subacute infarct in the left splenium of the corpus callosum and left posterior body of the corpus callosum as noted previously.  New area of restricted diffusion in the left parietal lobe measuring under 1 cm consistent with acute infarct since the prior MRI. No other areas of acute infarct  Moderate to advanced atrophy. Advanced chronic ischemic changes throughout the white matter. Chronic left parietal infarct. Chronic ischemia in the pons and left cerebellum. Negative for hemorrhage. Negative for mass or edema  MRA HEAD FINDINGS  Both vertebral arteries are  patent to the basilar. PICA patent bilaterally. Basilar widely patent. Superior cerebellar arteries are patent bilaterally. Moderate stenosis distal left posterior cerebral artery and severe stenosis distal right posterior cerebral artery  Atherosclerotic disease in the cavernous carotid artery with moderate stenosis on the left and mild stenosis on the right.  Moderate to severe stenosis proximal right A1 segment. Right anterior cerebral artery is patent. Right M1 segment is patent. Moderate disease in  the distal right M1 branches  Moderate stenosis in the proximal left A1 segment. Left M1 segment shows mild narrowing proximally. Mild stenosis in the distal left middle cerebral artery branches  Negative for cerebral aneurysm  IMPRESSION: Sub cm area of acute infarct in the left parietal cortex.  Areas of subacute infarction in the left corpus callosum as noted on the recent MRI of 02/03/14  Pronounced atrophy and chronic ischemic changes.  Moderate intracranial atherosclerotic disease without large vessel occlusion.   Electronically Signed   By: Marlan Palau M.D.   On: 02/13/2014 15:46   Mr Brain Wo Contrast  01/29/2014   CLINICAL DATA:  Decreased oral intake. History of falls. Hypertensive diabetic 80 year old male.  EXAM: MRI HEAD WITHOUT CONTRAST  TECHNIQUE: Multiplanar, multiecho pulse sequences of the brain and surrounding structures were obtained without intravenous contrast.  COMPARISON:  02-03-14 head CT.  12/14/2005 brain MR.  FINDINGS: Small acute nonhemorrhagic infarcts involving the left aspect of the posterior body of the corpus callosum and left aspect of the splenium of the corpus callosum.  Remote infarcts basal ganglia and mid aspect of the left coronal radiata. Remote left parietal lobe infarct with encephalomalacia. Remote small thalamic infarcts bilaterally. Remote right paracentral pontine infarct. Remote left cerebellar infarct.  Prominent small vessel disease type changes.  Global  atrophy without hydrocephalus.  No intracranial mass lesion noted on this unenhanced exam.  Cervical spondylotic changes C4-5. Cervical medullary junction, pituitary region, pineal region and orbital structures unremarkable.  Major intracranial vascular structures are patent.  IMPRESSION: Small acute nonhemorrhagic infarcts involving the left aspect of the posterior body of the corpus callosum and left aspect of the splenium of the corpus callosum.  Remote infarcts and prominent small vessel disease type changes as detailed above.  Global atrophy without hydrocephalus.   Electronically Signed   By: Bridgett Larsson M.D.   On: 01/29/2014 00:25   Dg Chest Portable 1 View  02/13/2014   CLINICAL DATA:  Altered mental status  EXAM: PORTABLE CHEST - 1 VIEW  COMPARISON:  Chest radiograph 2014-02-03  FINDINGS: Stable cardiac and mediastinal contours. Low lung volumes. Elevation right hemidiaphragm. Minimal heterogeneous opacities right lung base. No pleural effusion or pneumothorax. Regional skeleton is unremarkable.  IMPRESSION: Elevation right hemidiaphragm. Minimal heterogeneous opacities right lung base favored to represent atelectasis. Infection not excluded.   Electronically Signed   By: Annia Belt M.D.   On: 02/13/2014 12:56    Admission HPI: Colin [name] is a 80 year old man with history of DM2, HTN, recently hospitalized 02-04-23 to 9/3 for small acute stroke, cognitive decline, R LE DVT started on warfarin presenting nonverbal x 2 days and R arm weakness x 1 day. His daughter and daughter's fiance report his sister visited him Sunday and caused him to be depressed since he didn't want her to see him in his debilitated condition. On Tuesday, he had decreased PO intake and he could not move his R arm. His family notes chills, diarrhea on Saturday that has since resolved. He had shaking of his left arm today that lasted a few seconds- no incontinence or previous episodes. His family denies fever, nausea, vomiting,  pain, hematochezia, melena. He has been swallowing slowly but family denies choking/coughing.   Previously on Prozac and was taken off a while ago. His family notes his depression was better controlled on Prozac. His family has been giving him all of his medications.   Hospital Course by problem list: Principal Problem:   Stroke Active Problems:  DIABETES MELLITUS, TYPE II   HYPERCHOLESTEROLEMIA   DEMENTIA   DEPRESSION   HYPERTENSION   Hyponatremia   History of stroke   Mild malnutrition   Altered mental status   Acute encephalopathy   Seizure disorder   1. Acute stroke, likely post stroke seizure: CT brain with no acute changes. MRI brain with sub cm area of acute infarct in left parietal cortex. Neuro consulted. Recommend Keppra  BID, EEG, and no need for repeat stroke workup. EEG wnl. SLP evaluated and recommend dysphagia 2 diet. PT/OT recommending SNF. He was increased to Keppra 1000 BID. His mental status improved and he was more alert. He was given  ASA daily and continued his therapeutic lovenox bridging to warfarin. Neurology did not recommend repeat work up of stroke. They will follow up with him as an outpatient. At discharge, he is more alert and awake. Following most commands.  2. AKI on CKD stage 2: Cr 1.77 at admission. Baseline around 1.3. Likely pre-renal 2/2 decreased PO intake. He is afebrile with no leukocytosis - does not meet SIRS criteria. He does have large leukocytes, numerous WBC, few squamous epithelial cells and bacteria on UA. He received rocephin 1g in the ED. He was continued on Levaquin. This was discontinued when his urine culture resulted with no predominant organisms. His AKI improved with fluid resuscitation and improved PO intake down to 1.24.  3. EKG changes: sinus arrythmia with nonspecific t wave changes. Troponins were negative. He remained stable.  4. DM2: On metformin  daily and lantus 15u daily. Last Hgb A1c 14 01/28/2014. Continued  SSI, Lantus 5u QHS and held metformin. Transitioned to Lantus 15u QHS as PO intake improved. Will restart metformin at discharge.  5. GERD: Stable, continued  protonix daily   6. Hyponatremia: He is hyponatremic to 129 - likely 2/2 poor PO intake. Baseline chronic hyponatremia with baseline around 134-136. Improved with 0.9 normal saline infusion and improved PO intake.  7. Depression: He was previously on Prozac. Family concerned that he has become more depressed since this was discontinued. Restarted Prozac.  8. DVT: diagnosed at last hospitalization. Continued therapeutic lovenox as he was subtherapeutic on warfarin. Continuing to bridge to warfarin at discharge.  Discharge Vitals:   BP 135/68  Pulse 91  Temp(Src) 98.9 F (37.2 C) (Oral)  Resp 15  Ht 5' 4.96" (1.65 m)  Wt 169 lb 12.1 oz (77 kg)  BMI 28.28 kg/m2  SpO2 99%  General Apperance: NAD  Head: Normocephalic, atraumatic  Eyes: PERRL, anicteric sclera  Ears: Normal external ear canal  Nose: Nares normal, septum midline, mucosa normal  Throat: Lips, mucosa normal  Neck: Supple, trachea midline  Back: No tenderness or bony abnormality  Lungs: Clear to auscultation bilaterally, no w/r/r  Heart: Irregularly irregular, no m/r/g  Chest Wall: Nontender, no deformities  Abdomen: Soft, nontender, nondistended, no rebound/guarding  Extremities: Normal, atraumatic, warm and well perfused, no edema  Pulses: 2+ throughout  Skin: No rashes or lesions  Neurologic: Arousable to voice. Alert. Responding to most questions. Following commands but with poor effort most of the time. RUE moving spontaneously. LUE grip strength 5/5. Moving LE spontaneously.   Discharge Labs:  Results for orders placed during the hospital encounter of 02/13/14 (from the past 24 hour(s))  GLUCOSE, CAPILLARY     Status: Abnormal   Collection Time    02/17/14  4:20 PM      Result Value Ref Range   Glucose-Capillary 205 (*) 70 - 99 mg/dL  Comment 1  Documented in Chart     Comment 2 Notify RN    GLUCOSE, CAPILLARY     Status: Abnormal   Collection Time    02/17/14  8:21 PM      Result Value Ref Range   Glucose-Capillary 187 (*) 70 - 99 mg/dL  GLUCOSE, CAPILLARY     Status: Abnormal   Collection Time    02/18/14  8:33 AM      Result Value Ref Range   Glucose-Capillary 227 (*) 70 - 99 mg/dL   Comment 1 Documented in Chart     Comment 2 Notify RN    CBC     Status: Abnormal   Collection Time    02/18/14  9:45 AM      Result Value Ref Range   WBC 4.8  4.0 - 10.5 K/uL   RBC 4.03 (*) 4.22 - 5.81 MIL/uL   Hemoglobin 11.5 (*) 13.0 - 17.0 g/dL   HCT 78.2 (*) 95.6 - 21.3 %   MCV 85.9  78.0 - 100.0 fL   MCH 28.5  26.0 - 34.0 pg   MCHC 33.2  30.0 - 36.0 g/dL   RDW 08.6  57.8 - 46.9 %   Platelets 278  150 - 400 K/uL  PROTIME-INR     Status: Abnormal   Collection Time    02/18/14  9:45 AM      Result Value Ref Range   Prothrombin Time 19.7 (*) 11.6 - 15.2 seconds   INR 1.67 (*) 0.00 - 1.49  BASIC METABOLIC PANEL     Status: Abnormal   Collection Time    02/18/14  9:45 AM      Result Value Ref Range   Sodium 134 (*) 137 - 147 mEq/L   Potassium 4.2  3.7 - 5.3 mEq/L   Chloride 96  96 - 112 mEq/L   CO2 24  19 - 32 mEq/L   Glucose, Bld 250 (*) 70 - 99 mg/dL   BUN 13  6 - 23 mg/dL   Creatinine, Ser 6.29  0.50 - 1.35 mg/dL   Calcium 9.1  8.4 - 52.8 mg/dL   GFR calc non Af Amer 54 (*) >90 mL/min   GFR calc Af Amer 62 (*) >90 mL/min   Anion gap 14  5 - 15  GLUCOSE, CAPILLARY     Status: Abnormal   Collection Time    02/18/14 11:48 AM      Result Value Ref Range   Glucose-Capillary 310 (*) 70 - 99 mg/dL   Comment 1 Documented in Chart     Comment 2 Notify RN      Signed: Griffin Basil, MD 02/18/2014, 3:35 PM    Services Ordered on Discharge: SNF Equipment Ordered on Discharge: None

## 2014-02-19 NOTE — Discharge Summary (Signed)
I saw Colin [name] at time of discharge and assisted in the discharge planning.

## 2014-03-22 ENCOUNTER — Encounter: Payer: Self-pay | Admitting: Neurology

## 2014-03-22 ENCOUNTER — Ambulatory Visit (INDEPENDENT_AMBULATORY_CARE_PROVIDER_SITE_OTHER): Payer: Medicare PPO | Admitting: Neurology

## 2014-03-22 VITALS — BP 138/61 | HR 53

## 2014-03-22 DIAGNOSIS — IMO0002 Reserved for concepts with insufficient information to code with codable children: Secondary | ICD-10-CM | POA: Insufficient documentation

## 2014-03-22 DIAGNOSIS — I639 Cerebral infarction, unspecified: Secondary | ICD-10-CM

## 2014-03-22 DIAGNOSIS — E785 Hyperlipidemia, unspecified: Secondary | ICD-10-CM

## 2014-03-22 DIAGNOSIS — E1165 Type 2 diabetes mellitus with hyperglycemia: Secondary | ICD-10-CM | POA: Insufficient documentation

## 2014-03-22 DIAGNOSIS — I82401 Acute embolism and thrombosis of unspecified deep veins of right lower extremity: Secondary | ICD-10-CM

## 2014-03-22 DIAGNOSIS — E1159 Type 2 diabetes mellitus with other circulatory complications: Secondary | ICD-10-CM

## 2014-03-22 DIAGNOSIS — Z86718 Personal history of other venous thrombosis and embolism: Secondary | ICD-10-CM | POA: Insufficient documentation

## 2014-03-22 NOTE — Patient Instructions (Signed)
-   continue coumadin and adjust dose to target INR 2-3  - d/c ASA when INR reaches 2-3  - TCD bubble study  - Follow up with your primary care physician for stroke risk factor modification. Recommend maintain blood pressure goal <130/80, diabetes with hemoglobin A1c goal below 6.5% and lipids with LDL cholesterol goal below 70 mg/dL.  - continue lipitor for stroke prevention  - continue keppra for seizure prophylaxis  - RTC in 2 months

## 2014-03-22 NOTE — Progress Notes (Signed)
STROKE NEUROLOGY FOLLOW UP NOTE  NAME: Colin [name] DOB: 10-Apr-1933  REASON FOR VISIT: stroke follow up HISTORY FROM: chart  Today we had the pleasure of seeing Colin [name] in follow-up at our [facility]. Pt was accompanied by nurse assistant from ALS.   History Summary Colin [name] is a 80 y.o. male with history of DM2, CAD with CABGx4 in 1997, HTN, stroke, and arthritis was admitted on 01/28/14 for dehydration and declining health. 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 Pt found to have extensive right LE DVT. He was put on heparin drip and bridged to coumadin on discharge. He dose have extensive stroke risk factors so his stroke could be due to small vessel disease as his uncontrolled DM and HLD, but also could be paradoxical emboli from DVT provided he has PFO. He was discharged home with home health.   However, on 02/13/14, he was readmitted to hospital due to lethargy and not moving right arm as well as shaking and jerking of left arms. Had MRI again showed new punctate infarcts in the left parietal cortex. His INR was 1.6. He was treated with lovenox bridging to coumadin and given ASA 81mg . He was also concerned for seizure like activity but EEG WNL, he was put on keppra for seizure control. He was then discharged to SNF.   Interval History During the interval time, the patient has been doing fair. He is in SNF and improving. Eating is better but still has cognitive impairment. He is on coumadin and I have no INR result available with the documents provided from SNF. It seems like his INR still not stable and currently has weakly INR checking and his coumadin from 7mg  daily increased to 7.5mg  daily.  REVIEW OF SYSTEMS: Full 14 system review of systems performed and notable only for those listed below and in HPI above, all others are negative:  Constitutional: N/A  Cardiovascular: N/A  Ear/Nose/Throat: N/A  Skin: N/A  Eyes: N/A    Respiratory: N/A  Gastroitestinal: N/A  Genitourinary: N/A Hematology/Lymphatic: N/A  Endocrine: N/A  Musculoskeletal: N/A  Allergy/Immunology: N/A  Neurological: N/A  Psychiatric: N/A  The following represents the patient's updated allergies and side effects list: Allergies  Allergen Reactions  . Ramipril     REACTION: Cough    Labs since last visit of relevance include the following: Results for orders placed during the hospital encounter of 02/13/14  URINE CULTURE      Result Value Ref Range   Specimen Description URINE, CATHETERIZED     Special Requests NONE     Culture  Setup Time       Value: 02/13/2014 15:36     Performed at [facility] Count       Value: 45,000 COLONIES/ML     Performed at [facility]   Culture       Value: Multiple bacterial morphotypes present, none predominant. Suggest appropriate recollection if clinically indicated.     Performed at [facility]   Report Status 02/14/2014 FINAL    CBC      Result Value Ref Range   WBC 5.5  4.0 - 10.5 K/uL   RBC 4.15 (*) 4.22 - 5.81 MIL/uL   Hemoglobin 11.9 (*) 13.0 - 17.0 g/dL   HCT 60.4 (*) 54.0 - 98.1 %   MCV 84.3  78.0 - 100.0 fL   MCH 28.7  26.0 - 34.0 pg   MCHC 34.0  30.0 - 36.0  g/dL   RDW 96.0  45.4 - 09.8 %   Platelets 262  150 - 400 K/uL  URINALYSIS, ROUTINE W REFLEX MICROSCOPIC      Result Value Ref Range   Color, Urine AMBER (*) YELLOW   APPearance TURBID (*) CLEAR   Specific Gravity, Urine 1.019  1.005 - 1.030   pH 5.5  5.0 - 8.0   Glucose, UA 250 (*) NEGATIVE mg/dL   Hgb urine dipstick LARGE (*) NEGATIVE   Bilirubin Urine NEGATIVE  NEGATIVE   Ketones, ur 15 (*) NEGATIVE mg/dL   Protein, ur 119 (*) NEGATIVE mg/dL   Urobilinogen, UA 0.2  0.0 - 1.0 mg/dL   Nitrite NEGATIVE  NEGATIVE   Leukocytes, UA LARGE (*) NEGATIVE  COMPREHENSIVE METABOLIC PANEL      Result Value Ref Range   Sodium 129 (*) 137 - 147 mEq/L   Potassium 4.2  3.7 - 5.3 mEq/L    Chloride 89 (*) 96 - 112 mEq/L   CO2 26  19 - 32 mEq/L   Glucose, Bld 184 (*) 70 - 99 mg/dL   BUN 25 (*) 6 - 23 mg/dL   Creatinine, Ser 1.47 (*) 0.50 - 1.35 mg/dL   Calcium 9.4  8.4 - 82.9 mg/dL   Total Protein 8.1  6.0 - 8.3 g/dL   Albumin 3.2 (*) 3.5 - 5.2 g/dL   AST 30  0 - 37 U/L   ALT 22  0 - 53 U/L   Alkaline Phosphatase 55  39 - 117 U/L   Total Bilirubin 0.5  0.3 - 1.2 mg/dL   GFR calc non Af Amer 35 (*) >90 mL/min   GFR calc Af Amer 40 (*) >90 mL/min   Anion gap 14  5 - 15  DIFFERENTIAL      Result Value Ref Range   Neutrophils Relative % 60  43 - 77 %   Neutro Abs 3.3  1.7 - 7.7 K/uL   Lymphocytes Relative 29  12 - 46 %   Lymphs Abs 1.6  0.7 - 4.0 K/uL   Monocytes Relative 11  3 - 12 %   Monocytes Absolute 0.6  0.1 - 1.0 K/uL   Eosinophils Relative 0  0 - 5 %   Eosinophils Absolute 0.0  0.0 - 0.7 K/uL   Basophils Relative 0  0 - 1 %   Basophils Absolute 0.0  0.0 - 0.1 K/uL  URINE MICROSCOPIC-ADD ON      Result Value Ref Range   Squamous Epithelial / LPF FEW (*) RARE   WBC, UA TOO NUMEROUS TO COUNT  <3 WBC/hpf   RBC / HPF TOO NUMEROUS TO COUNT  <3 RBC/hpf   Bacteria, UA FEW (*) RARE   Casts GRANULAR CAST (*) NEGATIVE  LACTIC ACID, PLASMA      Result Value Ref Range   Lactic Acid, Venous 2.2  0.5 - 2.2 mmol/L  PROTIME-INR      Result Value Ref Range   Prothrombin Time 19.3 (*) 11.6 - 15.2 seconds   INR 1.63 (*) 0.00 - 1.49  BASIC METABOLIC PANEL      Result Value Ref Range   Sodium 132 (*) 137 - 147 mEq/L   Potassium 4.2  3.7 - 5.3 mEq/L   Chloride 94 (*) 96 - 112 mEq/L   CO2 23  19 - 32 mEq/L   Glucose, Bld 158 (*) 70 - 99 mg/dL   BUN 23  6 - 23 mg/dL   Creatinine, Ser 5.62 (*)  0.50 - 1.35 mg/dL   Calcium 8.6  8.4 - 29.5 mg/dL   GFR calc non Af Amer 44 (*) >90 mL/min   GFR calc Af Amer 51 (*) >90 mL/min   Anion gap 15  5 - 15  BASIC METABOLIC PANEL      Result Value Ref Range   Sodium 136 (*) 137 - 147 mEq/L   Potassium 4.1  3.7 - 5.3 mEq/L   Chloride  100  96 - 112 mEq/L   CO2 22  19 - 32 mEq/L   Glucose, Bld 105 (*) 70 - 99 mg/dL   BUN 21  6 - 23 mg/dL   Creatinine, Ser 6.21 (*) 0.50 - 1.35 mg/dL   Calcium 8.3 (*) 8.4 - 10.5 mg/dL   GFR calc non Af Amer 44 (*) >90 mL/min   GFR calc Af Amer 51 (*) >90 mL/min   Anion gap 14  5 - 15  TROPONIN I      Result Value Ref Range   Troponin I <0.30  <0.30 ng/mL  GLUCOSE, CAPILLARY      Result Value Ref Range   Glucose-Capillary 125 (*) 70 - 99 mg/dL  GLUCOSE, CAPILLARY      Result Value Ref Range   Glucose-Capillary 138 (*) 70 - 99 mg/dL  GLUCOSE, CAPILLARY      Result Value Ref Range   Glucose-Capillary 118 (*) 70 - 99 mg/dL  GLUCOSE, CAPILLARY      Result Value Ref Range   Glucose-Capillary 100 (*) 70 - 99 mg/dL  PROTIME-INR      Result Value Ref Range   Prothrombin Time 19.8 (*) 11.6 - 15.2 seconds   INR 1.68 (*) 0.00 - 1.49  GLUCOSE, CAPILLARY      Result Value Ref Range   Glucose-Capillary 90  70 - 99 mg/dL  CBC      Result Value Ref Range   WBC 3.6 (*) 4.0 - 10.5 K/uL   RBC 3.90 (*) 4.22 - 5.81 MIL/uL   Hemoglobin 11.2 (*) 13.0 - 17.0 g/dL   HCT 30.8 (*) 65.7 - 84.6 %   MCV 85.6  78.0 - 100.0 fL   MCH 28.7  26.0 - 34.0 pg   MCHC 33.5  30.0 - 36.0 g/dL   RDW 96.2  95.2 - 84.1 %   Platelets 215  150 - 400 K/uL  PROTIME-INR      Result Value Ref Range   Prothrombin Time 19.8 (*) 11.6 - 15.2 seconds   INR 1.68 (*) 0.00 - 1.49  BASIC METABOLIC PANEL      Result Value Ref Range   Sodium 137  137 - 147 mEq/L   Potassium 4.3  3.7 - 5.3 mEq/L   Chloride 101  96 - 112 mEq/L   CO2 22  19 - 32 mEq/L   Glucose, Bld 106 (*) 70 - 99 mg/dL   BUN 14  6 - 23 mg/dL   Creatinine, Ser 3.24  0.50 - 1.35 mg/dL   Calcium 8.3 (*) 8.4 - 10.5 mg/dL   GFR calc non Af Amer 56 (*) >90 mL/min   GFR calc Af Amer 65 (*) >90 mL/min   Anion gap 14  5 - 15  GLUCOSE, CAPILLARY      Result Value Ref Range   Glucose-Capillary 70  70 - 99 mg/dL  GLUCOSE, CAPILLARY      Result Value Ref Range    Glucose-Capillary 141 (*) 70 - 99 mg/dL  GLUCOSE, CAPILLARY  Result Value Ref Range   Glucose-Capillary 87  70 - 99 mg/dL  GLUCOSE, CAPILLARY      Result Value Ref Range   Glucose-Capillary 117 (*) 70 - 99 mg/dL   Comment 1 Notify RN    GLUCOSE, CAPILLARY      Result Value Ref Range   Glucose-Capillary 91  70 - 99 mg/dL  GLUCOSE, CAPILLARY      Result Value Ref Range   Glucose-Capillary 122 (*) 70 - 99 mg/dL   Comment 1 Documented in Chart    GLUCOSE, CAPILLARY      Result Value Ref Range   Glucose-Capillary 182 (*) 70 - 99 mg/dL   Comment 1 Documented in Chart     Comment 2 Notify RN    PROTIME-INR      Result Value Ref Range   Prothrombin Time 21.9 (*) 11.6 - 15.2 seconds   INR 1.91 (*) 0.00 - 1.49  BASIC METABOLIC PANEL      Result Value Ref Range   Sodium 134 (*) 137 - 147 mEq/L   Potassium 4.3  3.7 - 5.3 mEq/L   Chloride 100  96 - 112 mEq/L   CO2 20  19 - 32 mEq/L   Glucose, Bld 213 (*) 70 - 99 mg/dL   BUN 13  6 - 23 mg/dL   Creatinine, Ser 1.611.18  0.50 - 1.35 mg/dL   Calcium 8.6  8.4 - 09.610.5 mg/dL   GFR calc non Af Amer 57 (*) >90 mL/min   GFR calc Af Amer 66 (*) >90 mL/min   Anion gap 14  5 - 15  CBC      Result Value Ref Range   WBC 3.8 (*) 4.0 - 10.5 K/uL   RBC 3.56 (*) 4.22 - 5.81 MIL/uL   Hemoglobin 10.3 (*) 13.0 - 17.0 g/dL   HCT 04.530.8 (*) 40.939.0 - 81.152.0 %   MCV 86.5  78.0 - 100.0 fL   MCH 28.9  26.0 - 34.0 pg   MCHC 33.4  30.0 - 36.0 g/dL   RDW 91.413.0  78.211.5 - 95.615.5 %   Platelets 238  150 - 400 K/uL  GLUCOSE, CAPILLARY      Result Value Ref Range   Glucose-Capillary 189 (*) 70 - 99 mg/dL   Comment 1 Notify RN    GLUCOSE, CAPILLARY      Result Value Ref Range   Glucose-Capillary 212 (*) 70 - 99 mg/dL  GLUCOSE, CAPILLARY      Result Value Ref Range   Glucose-Capillary 175 (*) 70 - 99 mg/dL  GLUCOSE, CAPILLARY      Result Value Ref Range   Glucose-Capillary 158 (*) 70 - 99 mg/dL  GLUCOSE, CAPILLARY      Result Value Ref Range   Glucose-Capillary 166  (*) 70 - 99 mg/dL  GLUCOSE, CAPILLARY      Result Value Ref Range   Glucose-Capillary 167 (*) 70 - 99 mg/dL  PROTIME-INR      Result Value Ref Range   Prothrombin Time 21.7 (*) 11.6 - 15.2 seconds   INR 1.89 (*) 0.00 - 1.49  CBC      Result Value Ref Range   WBC 3.4 (*) 4.0 - 10.5 K/uL   RBC 3.49 (*) 4.22 - 5.81 MIL/uL   Hemoglobin 9.9 (*) 13.0 - 17.0 g/dL   HCT 21.329.7 (*) 08.639.0 - 57.852.0 %   MCV 85.1  78.0 - 100.0 fL   MCH 28.4  26.0 - 34.0 pg  MCHC 33.3  30.0 - 36.0 g/dL   RDW 45.4  09.8 - 11.9 %   Platelets 249  150 - 400 K/uL  BASIC METABOLIC PANEL      Result Value Ref Range   Sodium 136 (*) 137 - 147 mEq/L   Potassium 4.2  3.7 - 5.3 mEq/L   Chloride 101  96 - 112 mEq/L   CO2 23  19 - 32 mEq/L   Glucose, Bld 237 (*) 70 - 99 mg/dL   BUN 12  6 - 23 mg/dL   Creatinine, Ser 1.47  0.50 - 1.35 mg/dL   Calcium 8.5  8.4 - 82.9 mg/dL   GFR calc non Af Amer 55 (*) >90 mL/min   GFR calc Af Amer 64 (*) >90 mL/min   Anion gap 12  5 - 15  GLUCOSE, CAPILLARY      Result Value Ref Range   Glucose-Capillary 172 (*) 70 - 99 mg/dL  GLUCOSE, CAPILLARY      Result Value Ref Range   Glucose-Capillary 273 (*) 70 - 99 mg/dL  GLUCOSE, CAPILLARY      Result Value Ref Range   Glucose-Capillary 185 (*) 70 - 99 mg/dL   Comment 1 Documented in Chart     Comment 2 Notify RN    GLUCOSE, CAPILLARY      Result Value Ref Range   Glucose-Capillary 217 (*) 70 - 99 mg/dL   Comment 1 Documented in Chart     Comment 2 Notify RN    GLUCOSE, CAPILLARY      Result Value Ref Range   Glucose-Capillary 205 (*) 70 - 99 mg/dL   Comment 1 Documented in Chart     Comment 2 Notify RN    CBC      Result Value Ref Range   WBC 4.8  4.0 - 10.5 K/uL   RBC 4.03 (*) 4.22 - 5.81 MIL/uL   Hemoglobin 11.5 (*) 13.0 - 17.0 g/dL   HCT 56.2 (*) 13.0 - 86.5 %   MCV 85.9  78.0 - 100.0 fL   MCH 28.5  26.0 - 34.0 pg   MCHC 33.2  30.0 - 36.0 g/dL   RDW 78.4  69.6 - 29.5 %   Platelets 278  150 - 400 K/uL  PROTIME-INR       Result Value Ref Range   Prothrombin Time 19.7 (*) 11.6 - 15.2 seconds   INR 1.67 (*) 0.00 - 1.49  BASIC METABOLIC PANEL      Result Value Ref Range   Sodium 134 (*) 137 - 147 mEq/L   Potassium 4.2  3.7 - 5.3 mEq/L   Chloride 96  96 - 112 mEq/L   CO2 24  19 - 32 mEq/L   Glucose, Bld 250 (*) 70 - 99 mg/dL   BUN 13  6 - 23 mg/dL   Creatinine, Ser 2.84  0.50 - 1.35 mg/dL   Calcium 9.1  8.4 - 13.2 mg/dL   GFR calc non Af Amer 54 (*) >90 mL/min   GFR calc Af Amer 62 (*) >90 mL/min   Anion gap 14  5 - 15  GLUCOSE, CAPILLARY      Result Value Ref Range   Glucose-Capillary 187 (*) 70 - 99 mg/dL  GLUCOSE, CAPILLARY      Result Value Ref Range   Glucose-Capillary 227 (*) 70 - 99 mg/dL   Comment 1 Documented in Chart     Comment 2 Notify RN    GLUCOSE, CAPILLARY  Result Value Ref Range   Glucose-Capillary 310 (*) 70 - 99 mg/dL   Comment 1 Documented in Chart     Comment 2 Notify RN    GLUCOSE, CAPILLARY      Result Value Ref Range   Glucose-Capillary 148 (*) 70 - 99 mg/dL   Comment 1 Documented in Chart     Comment 2 Notify RN    CBG MONITORING, ED      Result Value Ref Range   Glucose-Capillary 178 (*) 70 - 99 mg/dL   Comment 1 Notify RN    CBG MONITORING, ED      Result Value Ref Range   Glucose-Capillary 155 (*) 70 - 99 mg/dL    The neurologically relevant items on the patient's problem list were reviewed on today's visit.  Neurologic Examination  A problem focused neurological exam (12 or more points of the single system neurologic examination, vital signs counts as 1 point, cranial nerves count for 8 points) was performed.  Blood pressure 138/61, pulse 53, height 0' (0 m), weight 0 lb (0 kg).  General - Well nourished, well developed, in no apparent distress.  Ophthalmologic - not cooperative on exam.  Cardiovascular - Regular rate and rhythm with no murmur.  Mental Status -  Awake alert but not orientated to place, time or people. Psychomotor slowing and  needs time to respond to questions Significant paucity of language, but expression, repetition, comprehension seems intact, but naming impaired.  Cranial Nerves II - XII - II - Visual field intact OU, but difficult to test visual distinction as he is not cooperative. III, IV, VI - Extraocular movements seem intact. V - Facial sensation intact bilaterally. VII - right nasolabial fold flattening. VIII - Hard of hearing & vestibular intact bilaterally. X - Palate elevates symmetrically. XI - Chin turning & shoulder shrug intact bilaterally. XII - Tongue protrusion intact.  Motor Strength - The patient's strength was normal in all extremities and pronator drift was absent except right LE 2/5 proximal due to pain and pt stated that he has broken bone before.  Bulk was normal and fasciculations were absent.   Motor Tone - Muscle tone was assessed at the neck and appendages and was normal.  Reflexes - The patient's reflexes were decreased in all extremities and he had no pathological reflexes.  Sensory - Light touch, temperature/pinprick were assessed and were symmetrical.    Coordination - The patient had normal movements in the hands with no ataxia or dysmetria.  Tremor was absent.  Gait and Station - in wheelchair, gait not tested.  Data reviewed: I personally reviewed the images and agree with the radiology interpretations.  Dg Chest 2 View  01/28/2014 There is no CHF nor other acute cardiopulmonary abnormality.  Ct Head Wo Contrast  01/28/2014 Small subacute to old high LEFT parietal cortical infarct new since 2011. Atrophy with small vessel chronic ischemic changes of deep cerebral white matter. Old lacunar infarcts LEFT basal ganglia and RIGHT thalamus.  Mr Brain Wo Contrast  01/29/2014 Small acute nonhemorrhagic infarcts involving the left aspect of the posterior body of the corpus callosum and left aspect of the splenium of the corpus callosum. Remote infarcts and prominent small vessel  disease type changes as detailed above. Global atrophy without hydrocephalus.  LE Venous Doppler  - Findings consistent with acute deep vein thrombosis involving the right saphenofemoral junction, common femoral vein, profunda vein, femoral vein, and popliteal vein. - Deep vein thrombosis involving the left posterior tibial vein. -  Findings consistent with acute deep vein thrombosis involving the left lower extremity. - No evidence of Baker&'s cyst on the right or left. 2D Echocardiogram EF 55-60% with no source of embolus.  Carotid Doppler No evidence of hemodynamically significant internal carotid artery stenosis. Vertebral artery flow is antegrade.  MRI and MRA brain 02/13/14 Sub cm area of acute infarct in the left parietal cortex.  Areas of subacute infarction in the left corpus callosum as noted on the recent MRI of Feb 26, 2014  Pronounced atrophy and chronic ischemic changes.  Moderate intracranial atherosclerotic disease without large vessel occlusion  Component     Latest Ref Rng 02/26/14 01/29/2014 02/18/2014  Cholesterol     0 - 200 mg/dL  161 (H)   Triglycerides     <150 mg/dL  096   HDL     >04 mg/dL  33 (L)   Total CHOL/HDL Ratio       6.5   VLDL     0 - 40 mg/dL  21   LDL (calc)     0 - 99 mg/dL  540 (H)   Hemoglobin J8J     <5.7 % 14.0 (H)    Mean Plasma Glucose     <117 mg/dL 191 (H)    Prothrombin Time     11.6 - 15.2 seconds   19.7 (H)  INR     0.00 - 1.49   1.67 (H)    Assessment: As you may recall, he is a 80 y.o. Caucasian male with PMH of DM2, CAD with CABGx4 in 1997, HTN, stroke, and arthritis was admitted on 26-Feb-2014 for dehydration and declining health. 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 Pt found to have extensive right LE DVT. He was put on heparin drip and bridged to coumadin on discharge. However, he was readmitted on 02/13/14 due to right arm weakness with questionable shaking and jerking on the left and AMS. EEG WNL but he was  put on keppra. MRI showed new parietal punctate infarcts. His INR was not therapeutic. He was bridged with lovenox and put on ASA 81mg  and sent to SNF. He dose have extensive stroke risk factors with high LDL and A1C level, so his stroke could be due to small vessel disease as his uncontrolled DM and HLD, but the appearance is more consistent with paradoxical emboli from DVT. However, whether he has PFO has not been tested. Will do TCD bubble study to confirm PFO. He needs close INR monitoring and adjusting coumadin dose in SNF.    Plan:  - continue coumadin and adjust dose to target INR 2-3 - d/c ASA when INR reaches 2-3 - TCD bubble study - Follow up with your primary care physician for stroke risk factor modification. Recommend maintain blood pressure goal <130/80, diabetes with hemoglobin A1c goal below 6.5% and lipids with LDL cholesterol goal below 70 mg/dL.  - continue lipitor for stroke prevention - continue keppra for seizure prophylaxis - RTC in 2 months  Orders Placed This Encounter  Procedures  . [place] TCD WITH BUBBLES    Standing Status: Future     Number of Occurrences:      Standing Expiration Date: 09/22/2014    Order Specific Question:  Reason for Exam (SYMPTOM  OR DIAGNOSIS REQUIRED)    Answer:  CVA with DVT, to confirm PFO    Order Specific Question:  Preferred imaging location?    Answer:  Internal    Meds ordered this encounter  Medications  . fondaparinux (  ARIXTRA) 7.5 MG/0.6ML SOLN injection    Sig:   . lisinopril (PRINIVIL,ZESTRIL) 5 MG tablet    Sig: Take 5 mg by mouth daily.   Marland Kitchen. lovastatin (MEVACOR) 20 MG tablet    Sig:   . docusate sodium (COLACE) 100 MG capsule    Sig: Take 100 mg by mouth 2 (two) times daily.  Marland Kitchen. acetaminophen (TYLENOL) 500 MG tablet    Sig: Take 500 mg by mouth every 6 (six) hours as needed.  . insulin lispro (HUMALOG) 100 UNIT/ML injection    Sig: Inject 3 Units into the skin 3 (three) times daily before meals.  Marland Kitchen. ampicillin (PRINCIPEN)  250 MG capsule    Sig: Take 250 mg by mouth 4 (four) times daily.      Marvel PlanJindong Mikiya Nebergall, MD PhD [facility] [address], [address] [place], [place] [address] [phone]

## 2014-04-09 ENCOUNTER — Telehealth: Payer: Self-pay | Admitting: Radiology

## 2014-05-17 ENCOUNTER — Telehealth: Payer: Self-pay

## 2014-05-17 NOTE — Telephone Encounter (Signed)
Unable to reach pt to discuss flu shot.  

## 2014-06-10 ENCOUNTER — Ambulatory Visit: Payer: Medicare PPO | Admitting: Neurology

## 2014-07-27 ENCOUNTER — Emergency Department (HOSPITAL_COMMUNITY)
Admission: EM | Admit: 2014-07-27 | Discharge: 2014-07-27 | Disposition: A | Discharge status: Home / Self Care | Payer: Medicare HMO | Attending: Emergency Medicine | Admitting: Emergency Medicine

## 2014-07-27 ENCOUNTER — Encounter (HOSPITAL_COMMUNITY): Payer: Self-pay | Admitting: Emergency Medicine

## 2014-07-27 DIAGNOSIS — E119 Type 2 diabetes mellitus without complications: Secondary | ICD-10-CM | POA: Insufficient documentation

## 2014-07-27 DIAGNOSIS — Z87891 Personal history of nicotine dependence: Secondary | ICD-10-CM | POA: Diagnosis not present

## 2014-07-27 DIAGNOSIS — Z794 Long term (current) use of insulin: Secondary | ICD-10-CM | POA: Insufficient documentation

## 2014-07-27 DIAGNOSIS — Z79899 Other long term (current) drug therapy: Secondary | ICD-10-CM | POA: Insufficient documentation

## 2014-07-27 DIAGNOSIS — R531 Weakness: Secondary | ICD-10-CM | POA: Diagnosis not present

## 2014-07-27 DIAGNOSIS — M1990 Unspecified osteoarthritis, unspecified site: Secondary | ICD-10-CM | POA: Diagnosis not present

## 2014-07-27 DIAGNOSIS — Z8673 Personal history of transient ischemic attack (TIA), and cerebral infarction without residual deficits: Secondary | ICD-10-CM | POA: Diagnosis not present

## 2014-07-27 DIAGNOSIS — Z7982 Long term (current) use of aspirin: Secondary | ICD-10-CM | POA: Diagnosis not present

## 2014-07-27 DIAGNOSIS — I10 Essential (primary) hypertension: Secondary | ICD-10-CM | POA: Insufficient documentation

## 2014-07-27 DIAGNOSIS — R451 Restlessness and agitation: Secondary | ICD-10-CM | POA: Insufficient documentation

## 2014-07-27 DIAGNOSIS — Z7901 Long term (current) use of anticoagulants: Secondary | ICD-10-CM | POA: Diagnosis not present

## 2014-07-27 HISTORY — DX: Unspecified dementia, unspecified severity, without behavioral disturbance, psychotic disturbance, mood disturbance, and anxiety: F03.90

## 2014-07-27 LAB — BASIC METABOLIC PANEL
Anion gap: 4 — ABNORMAL LOW (ref 5–15)
BUN: 14 mg/dL (ref 6–23)
CO2: 31 mmol/L (ref 19–32)
Calcium: 9.2 mg/dL (ref 8.4–10.5)
Chloride: 102 mmol/L (ref 96–112)
Creatinine, Ser: 1.19 mg/dL (ref 0.50–1.35)
GFR calc Af Amer: 65 mL/min — ABNORMAL LOW (ref >90)
GFR calc non Af Amer: 56 mL/min — ABNORMAL LOW (ref >90)
Glucose, Bld: 206 mg/dL — ABNORMAL HIGH (ref 70–99)
Potassium: 3.6 mmol/L (ref 3.5–5.1)
Sodium: 137 mmol/L (ref 135–145)

## 2014-07-27 LAB — CBC WITH DIFFERENTIAL/PLATELET
Basophils Absolute: 0 10*3/uL (ref 0.0–0.1)
Basophils Relative: 1 % (ref 0–1)
Eosinophils Absolute: 0 10*3/uL (ref 0.0–0.7)
Eosinophils Relative: 1 % (ref 0–5)
HCT: 37.7 % — ABNORMAL LOW (ref 39.0–52.0)
Hemoglobin: 12.7 g/dL — ABNORMAL LOW (ref 13.0–17.0)
Lymphocytes Relative: 45 % (ref 12–46)
Lymphs Abs: 1.8 10*3/uL (ref 0.7–4.0)
MCH: 29.3 pg (ref 26.0–34.0)
MCHC: 33.7 g/dL (ref 30.0–36.0)
MCV: 87.1 fL (ref 78.0–100.0)
Monocytes Absolute: 0.4 10*3/uL (ref 0.1–1.0)
Monocytes Relative: 10 % (ref 3–12)
Neutro Abs: 1.8 10*3/uL (ref 1.7–7.7)
Neutrophils Relative %: 45 % (ref 43–77)
Platelets: 205 10*3/uL (ref 150–400)
RBC: 4.33 MIL/uL (ref 4.22–5.81)
RDW: 12.9 % (ref 11.5–15.5)
WBC: 4 10*3/uL (ref 4.0–10.5)

## 2014-07-27 LAB — TROPONIN I: Troponin I: <0.03 ng/mL (ref <0.031)
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
## 2014-07-27 NOTE — ED Provider Notes (Signed)
Pt here for agitation.  Had argument with family members.  Report chronic cp.  Will check delta trop if trop 0.5.  If trop 1 then consult cards and admit.    5:01 PM Trop negative, Dr. Criss AlvineGoldston is aware.  He felt sxs likely social driven and less likely medical.  Dr. Criss AlvineGoldston recommend discharge.  Labs are otherwise at pt's baseline.   BP 114/85 mmHg  Pulse 90  Temp(Src) 98.4 F (36.9 C) (Oral)  Resp 20  SpO2 98%  I have reviewed nursing notes and vital signs. I personally reviewed the imaging tests through PACS system  I reviewed available ER/hospitalization records thought the EMR  Results for orders placed or performed during the hospital encounter of 07/27/14  Basic metabolic panel  Result Value Ref Range   Sodium 137 135 - 145 mmol/L   Potassium 3.6 3.5 - 5.1 mmol/L   Chloride 102 96 - 112 mmol/L   CO2 31 19 - 32 mmol/L   Glucose, Bld 206 (H) 70 - 99 mg/dL   BUN 14 6 - 23 mg/dL   Creatinine, Ser 9.141.19 0.50 - 1.35 mg/dL   Calcium 9.2 8.4 - 78.210.5 mg/dL   GFR calc non Af Amer 56 (L) >90 mL/min   GFR calc Af Amer 65 (L) >90 mL/min   Anion gap 4 (L) 5 - 15  CBC with Differential  Result Value Ref Range   WBC 4.0 4.0 - 10.5 K/uL   RBC 4.33 4.22 - 5.81 MIL/uL   Hemoglobin 12.7 (L) 13.0 - 17.0 g/dL   HCT 95.637.7 (L) 21.339.0 - 08.652.0 %   MCV 87.1 78.0 - 100.0 fL   MCH 29.3 26.0 - 34.0 pg   MCHC 33.7 30.0 - 36.0 g/dL   RDW 57.812.9 46.911.5 - 62.915.5 %   Platelets 205 150 - 400 K/uL   Neutrophils Relative % 45 43 - 77 %   Neutro Abs 1.8 1.7 - 7.7 K/uL   Lymphocytes Relative 45 12 - 46 %   Lymphs Abs 1.8 0.7 - 4.0 K/uL   Monocytes Relative 10 3 - 12 %   Monocytes Absolute 0.4 0.1 - 1.0 K/uL   Eosinophils Relative 1 0 - 5 %   Eosinophils Absolute 0.0 0.0 - 0.7 K/uL   Basophils Relative 1 0 - 1 %   Basophils Absolute 0.0 0.0 - 0.1 K/uL  Troponin I  Result Value Ref Range   Troponin I <0.03 <0.031 ng/mL   No results found.    Fayrene HelperBowie Neah Sporrer, PA-C 07/27/14 1702  Vida RollerBrian D Miller,  MD 07/28/14 [phone]

## 2014-07-27 NOTE — ED Notes (Addendum)
Pt reporting nitermittent left sided chest pain. EDP aware.

## 2014-07-27 NOTE — ED Notes (Signed)
Pt refusing catheter at this time. Pt drinking sprite to try to urinate

## 2014-07-27 NOTE — Discharge Instructions (Signed)
Return here as needed. Follow up with your doctor for a recheck. °

## 2014-07-27 NOTE — ED Notes (Addendum)
Pt here from home with report of sudden onset agitation with some trouble breathing while some "family drama" was going on. EMS reports that O2 sats were low on arrival but came up with 2 L [place], pt now on RA. History of dementia. NAD at this time. Left deficits from previous stroke.

## 2014-07-30 ENCOUNTER — Ambulatory Visit (INDEPENDENT_AMBULATORY_CARE_PROVIDER_SITE_OTHER): Payer: Medicare PPO | Admitting: Cardiology

## 2014-07-30 VITALS — BP 124/70 | HR 95

## 2014-07-30 DIAGNOSIS — Z8673 Personal history of transient ischemic attack (TIA), and cerebral infarction without residual deficits: Secondary | ICD-10-CM

## 2014-07-30 DIAGNOSIS — I82401 Acute embolism and thrombosis of unspecified deep veins of right lower extremity: Secondary | ICD-10-CM

## 2014-07-30 DIAGNOSIS — I10 Essential (primary) hypertension: Secondary | ICD-10-CM

## 2014-07-30 DIAGNOSIS — I2510 Atherosclerotic heart disease of native coronary artery without angina pectoris: Secondary | ICD-10-CM

## 2014-07-30 DIAGNOSIS — Z79899 Other long term (current) drug therapy: Secondary | ICD-10-CM

## 2014-07-30 DIAGNOSIS — E785 Hyperlipidemia, unspecified: Secondary | ICD-10-CM

## 2014-07-30 DIAGNOSIS — I639 Cerebral infarction, unspecified: Secondary | ICD-10-CM

## 2014-07-30 DIAGNOSIS — E1159 Type 2 diabetes mellitus with other circulatory complications: Secondary | ICD-10-CM

## 2014-07-30 NOTE — Patient Instructions (Addendum)
Your physician recommends that you return for lab work FASTING sometime this month.  Your physician recommends that you schedule a follow-up appointment in: 4-5 months with Dr.Harding - 30 min appointment.   [facility] to discuss your hospital visit and  starting the Coumadin.

## 2014-08-02 ENCOUNTER — Telehealth: Payer: Self-pay | Admitting: Cardiology

## 2014-08-02 NOTE — Telephone Encounter (Signed)
CALLED ALL PHONE NUMBERS AVAILABLE - NO AWAY TO LEAVE MESSAGE   NOTIFIED OPERATOR TO CONTACT  TRIAGE OR SHARON RN   PATIENT CAN GO NEXT WEEK TO HAVE LABS DONE- FASTING ANY SOLST[facility]  NEAR HIM.

## 2014-08-02 NOTE — Telephone Encounter (Signed)
Pt unable to come for lab work today,can he come on Monday?

## 2014-08-04 ENCOUNTER — Encounter: Payer: Self-pay | Admitting: Cardiology

## 2014-08-04 DIAGNOSIS — I2510 Atherosclerotic heart disease of native coronary artery without angina pectoris: Secondary | ICD-10-CM | POA: Insufficient documentation

## 2014-08-04 DIAGNOSIS — E785 Hyperlipidemia, unspecified: Secondary | ICD-10-CM | POA: Insufficient documentation

## 2014-08-04 DIAGNOSIS — I10 Essential (primary) hypertension: Secondary | ICD-10-CM | POA: Insufficient documentation

## 2014-08-04 NOTE — Assessment & Plan Note (Signed)
Lab Results  Component Value Date   CHOL 214* 01/29/2014   HDL 33* 01/29/2014   LDLCALC 160* 01/29/2014   TRIG 103 01/29/2014   CHOLHDL 6.5 01/29/2014   I will stop the lower potency statin (lovastatin) in lieu of continuing atorvastatin. Recheck followup fasting lipid panel with LFTs/chem 10.  History of hyponatremia and is on an ACE inhibitor. - Therefore we'll check chem 10
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
## 2014-08-04 NOTE — Assessment & Plan Note (Signed)
Difficult to assess any symptoms. As stated doesn't seem to have any active cardiac issues. He is not on beta blocker reportedly because of bradycardia during his hospital stay for stroke. He is on ACE inhibitor previously to statins.  No active symptoms. Continue aspirin - Will need to reassess whether or not to restart a beta blocker.

## 2014-08-04 NOTE — Assessment & Plan Note (Signed)
Apparently now he is sitting back on metformin, without any insulin. He needs a PCP to manage this.  [facility]. He will atretic need to establish followup with them or with the same location as his son-in-law.

## 2014-08-04 NOTE — Assessment & Plan Note (Signed)
Patient is supposed to be on warfarin. He is not been taking it since his discharge from rehabilitation center. This needs to be addressed probably by the neurology team or his primary physician.  Echo review his chart it would seem that he probably should be on for the correlation. We will contact the family to have him restart it. If necessary he can return here to have his levels followed.

## 2014-08-04 NOTE — Assessment & Plan Note (Addendum)
Blood pressure is well controlled. Continue simply taking low-dose ACE inhibitor.  Renal function appeared to be both a stable. Lab Results  Component Value Date   CREATININE 1.19 07/27/2014   Lab Results  Component Value Date   K 3.6 07/27/2014

## 2015-01-29 ENCOUNTER — Inpatient Hospital Stay (HOSPITAL_COMMUNITY): Payer: Medicare HMO

## 2015-01-29 ENCOUNTER — Emergency Department (HOSPITAL_COMMUNITY): Payer: Medicare HMO

## 2015-01-29 ENCOUNTER — Encounter (HOSPITAL_COMMUNITY): Payer: Self-pay | Admitting: Emergency Medicine

## 2015-01-29 ENCOUNTER — Inpatient Hospital Stay (HOSPITAL_COMMUNITY)
Admission: EM | Admit: 2015-01-29 | Discharge: 2015-02-03 | DRG: 871 | Disposition: A | Discharge status: Skilled Nursing Facility | Payer: Medicare HMO | Attending: Internal Medicine | Admitting: Internal Medicine

## 2015-01-29 DIAGNOSIS — I10 Essential (primary) hypertension: Secondary | ICD-10-CM | POA: Diagnosis present

## 2015-01-29 DIAGNOSIS — I129 Hypertensive chronic kidney disease with stage 1 through stage 4 chronic kidney disease, or unspecified chronic kidney disease: Secondary | ICD-10-CM | POA: Diagnosis present

## 2015-01-29 DIAGNOSIS — E876 Hypokalemia: Secondary | ICD-10-CM | POA: Diagnosis present

## 2015-01-29 DIAGNOSIS — I252 Old myocardial infarction: Secondary | ICD-10-CM | POA: Diagnosis not present

## 2015-01-29 DIAGNOSIS — Z8673 Personal history of transient ischemic attack (TIA), and cerebral infarction without residual deficits: Secondary | ICD-10-CM | POA: Diagnosis not present

## 2015-01-29 DIAGNOSIS — N138 Other obstructive and reflux uropathy: Secondary | ICD-10-CM

## 2015-01-29 DIAGNOSIS — E1165 Type 2 diabetes mellitus with hyperglycemia: Secondary | ICD-10-CM | POA: Diagnosis present

## 2015-01-29 DIAGNOSIS — R7989 Other specified abnormal findings of blood chemistry: Secondary | ICD-10-CM | POA: Diagnosis present

## 2015-01-29 DIAGNOSIS — E860 Dehydration: Secondary | ICD-10-CM | POA: Diagnosis present

## 2015-01-29 DIAGNOSIS — E785 Hyperlipidemia, unspecified: Secondary | ICD-10-CM | POA: Diagnosis present

## 2015-01-29 DIAGNOSIS — Z951 Presence of aortocoronary bypass graft: Secondary | ICD-10-CM

## 2015-01-29 DIAGNOSIS — N401 Enlarged prostate with lower urinary tract symptoms: Secondary | ICD-10-CM | POA: Diagnosis present

## 2015-01-29 DIAGNOSIS — E43 Unspecified severe protein-calorie malnutrition: Secondary | ICD-10-CM | POA: Diagnosis present

## 2015-01-29 DIAGNOSIS — IMO0002 Reserved for concepts with insufficient information to code with codable children: Secondary | ICD-10-CM | POA: Diagnosis present

## 2015-01-29 DIAGNOSIS — D509 Iron deficiency anemia, unspecified: Secondary | ICD-10-CM | POA: Diagnosis present

## 2015-01-29 DIAGNOSIS — N3091 Cystitis, unspecified with hematuria: Secondary | ICD-10-CM | POA: Diagnosis present

## 2015-01-29 DIAGNOSIS — R1310 Dysphagia, unspecified: Secondary | ICD-10-CM | POA: Diagnosis present

## 2015-01-29 DIAGNOSIS — Z7401 Bed confinement status: Secondary | ICD-10-CM

## 2015-01-29 DIAGNOSIS — E1122 Type 2 diabetes mellitus with diabetic chronic kidney disease: Secondary | ICD-10-CM | POA: Diagnosis present

## 2015-01-29 DIAGNOSIS — F0390 Unspecified dementia without behavioral disturbance: Secondary | ICD-10-CM | POA: Diagnosis present

## 2015-01-29 DIAGNOSIS — G40909 Epilepsy, unspecified, not intractable, without status epilepticus: Secondary | ICD-10-CM | POA: Diagnosis present

## 2015-01-29 DIAGNOSIS — Z6824 Body mass index (BMI) 24.0-24.9, adult: Secondary | ICD-10-CM | POA: Diagnosis not present

## 2015-01-29 DIAGNOSIS — R1031 Right lower quadrant pain: Secondary | ICD-10-CM | POA: Diagnosis present

## 2015-01-29 DIAGNOSIS — N179 Acute kidney failure, unspecified: Secondary | ICD-10-CM | POA: Diagnosis present

## 2015-01-29 DIAGNOSIS — R338 Other retention of urine: Secondary | ICD-10-CM | POA: Diagnosis present

## 2015-01-29 DIAGNOSIS — Z9861 Coronary angioplasty status: Secondary | ICD-10-CM

## 2015-01-29 DIAGNOSIS — Z7901 Long term (current) use of anticoagulants: Secondary | ICD-10-CM

## 2015-01-29 DIAGNOSIS — N183 Chronic kidney disease, stage 3 unspecified: Secondary | ICD-10-CM | POA: Diagnosis present

## 2015-01-29 DIAGNOSIS — R109 Unspecified abdominal pain: Secondary | ICD-10-CM

## 2015-01-29 DIAGNOSIS — N320 Bladder-neck obstruction: Secondary | ICD-10-CM | POA: Diagnosis present

## 2015-01-29 DIAGNOSIS — R011 Cardiac murmur, unspecified: Secondary | ICD-10-CM | POA: Diagnosis not present

## 2015-01-29 DIAGNOSIS — A412 Sepsis due to unspecified staphylococcus: Principal | ICD-10-CM | POA: Diagnosis present

## 2015-01-29 DIAGNOSIS — Z7982 Long term (current) use of aspirin: Secondary | ICD-10-CM

## 2015-01-29 DIAGNOSIS — R531 Weakness: Secondary | ICD-10-CM | POA: Diagnosis present

## 2015-01-29 DIAGNOSIS — I4581 Long QT syndrome: Secondary | ICD-10-CM | POA: Diagnosis present

## 2015-01-29 DIAGNOSIS — B957 Other staphylococcus as the cause of diseases classified elsewhere: Secondary | ICD-10-CM | POA: Diagnosis not present

## 2015-01-29 DIAGNOSIS — I2510 Atherosclerotic heart disease of native coronary artery without angina pectoris: Secondary | ICD-10-CM | POA: Diagnosis present

## 2015-01-29 DIAGNOSIS — N390 Urinary tract infection, site not specified: Secondary | ICD-10-CM

## 2015-01-29 DIAGNOSIS — R509 Fever, unspecified: Secondary | ICD-10-CM

## 2015-01-29 DIAGNOSIS — Z79899 Other long term (current) drug therapy: Secondary | ICD-10-CM | POA: Diagnosis not present

## 2015-01-29 DIAGNOSIS — E871 Hypo-osmolality and hyponatremia: Secondary | ICD-10-CM | POA: Diagnosis present

## 2015-01-29 DIAGNOSIS — Z87891 Personal history of nicotine dependence: Secondary | ICD-10-CM | POA: Diagnosis not present

## 2015-01-29 DIAGNOSIS — A411 Sepsis due to other specified staphylococcus: Secondary | ICD-10-CM

## 2015-01-29 DIAGNOSIS — R0789 Other chest pain: Secondary | ICD-10-CM | POA: Diagnosis present

## 2015-01-29 DIAGNOSIS — Z86718 Personal history of other venous thrombosis and embolism: Secondary | ICD-10-CM

## 2015-01-29 DIAGNOSIS — R9431 Abnormal electrocardiogram [ECG] [EKG]: Secondary | ICD-10-CM | POA: Diagnosis present

## 2015-01-29 DIAGNOSIS — N1330 Unspecified hydronephrosis: Secondary | ICD-10-CM | POA: Diagnosis present

## 2015-01-29 LAB — GLUCOSE, CAPILLARY
GLUCOSE-CAPILLARY: 167 mg/dL — AB (ref 65–99)
GLUCOSE-CAPILLARY: 270 mg/dL — AB (ref 65–99)
Glucose-Capillary: 165 mg/dL — ABNORMAL HIGH (ref 65–99)

## 2015-01-29 LAB — TROPONIN I
Troponin I: <0.03 ng/mL (ref <0.031)
Troponin I: <0.03 ng/mL (ref <0.031)
Troponin I: <0.03 ng/mL (ref <0.031)

## 2015-01-29 LAB — URINALYSIS, ROUTINE W REFLEX MICROSCOPIC
BILIRUBIN URINE: NEGATIVE
GLUCOSE, UA: NEGATIVE mg/dL
Ketones, ur: NEGATIVE mg/dL
Nitrite: NEGATIVE
PH: 6 (ref 5.0–8.0)
Protein, ur: NEGATIVE mg/dL
Specific Gravity, Urine: 1.005 (ref 1.005–1.030)
Urobilinogen, UA: 0.2 mg/dL (ref 0.0–1.0)

## 2015-01-29 LAB — BASIC METABOLIC PANEL
Anion gap: 9 (ref 5–15)
BUN: 25 mg/dL — ABNORMAL HIGH (ref 6–20)
CALCIUM: 8.8 mg/dL — AB (ref 8.9–10.3)
CO2: 28 mmol/L (ref 22–32)
CREATININE: 2 mg/dL — AB (ref 0.61–1.24)
Chloride: 94 mmol/L — ABNORMAL LOW (ref 101–111)
GFR calc Af Amer: 35 mL/min — ABNORMAL LOW (ref >60)
GFR calc non Af Amer: 30 mL/min — ABNORMAL LOW (ref >60)
Glucose, Bld: 248 mg/dL — ABNORMAL HIGH (ref 65–99)
Potassium: 3.1 mmol/L — ABNORMAL LOW (ref 3.5–5.1)
Sodium: 131 mmol/L — ABNORMAL LOW (ref 135–145)

## 2015-01-29 LAB — URINE MICROSCOPIC-ADD ON

## 2015-01-29 LAB — CBC
HCT: 29.7 % — ABNORMAL LOW (ref 39.0–52.0)
Hemoglobin: 9.7 g/dL — ABNORMAL LOW (ref 13.0–17.0)
MCH: 27.8 pg (ref 26.0–34.0)
MCHC: 32.7 g/dL (ref 30.0–36.0)
MCV: 85.1 fL (ref 78.0–100.0)
PLATELETS: 297 10*3/uL (ref 150–400)
RBC: 3.49 MIL/uL — ABNORMAL LOW (ref 4.22–5.81)
RDW: 13.9 % (ref 11.5–15.5)
WBC: 8.1 10*3/uL (ref 4.0–10.5)

## 2015-01-29 LAB — PROTIME-INR
INR: 1.22 (ref 0.00–1.49)
PROTHROMBIN TIME: 15.5 s — AB (ref 11.6–15.2)

## 2015-01-29 LAB — CBG MONITORING, ED: Glucose-Capillary: 248 mg/dL — ABNORMAL HIGH (ref 65–99)

## 2015-01-29 LAB — I-STAT TROPONIN, ED: Troponin i, poc: 0.01 ng/mL (ref 0.00–0.08)

## 2015-01-29 LAB — LACTIC ACID, PLASMA
LACTIC ACID, VENOUS: 1.7 mmol/L (ref 0.5–2.0)
Lactic Acid, Venous: 1.4 mmol/L (ref 0.5–2.0)

## 2015-01-29 LAB — LIPASE, BLOOD: Lipase: 21 U/L — ABNORMAL LOW (ref 22–51)

## 2015-01-29 MED ORDER — IOHEXOL 300 MG/ML  SOLN
25.0000 mL | INTRAMUSCULAR | Status: AC
  Administered 2015-01-29 (×2): 25 mL via ORAL

## 2015-01-29 MED ORDER — DEXTROSE 5 % IV SOLN
1.0000 g | Freq: Once | INTRAVENOUS | Status: AC
  Administered 2015-01-29: 1 g via INTRAVENOUS
  Filled 2015-01-29: qty 10

## 2015-01-29 MED ORDER — ACETAMINOPHEN 650 MG RE SUPP
650.0000 mg | Freq: Four times a day (QID) | RECTAL | Status: DC | PRN
  Filled 2015-01-29: qty 1

## 2015-01-29 MED ORDER — HYDRALAZINE HCL 20 MG/ML IJ SOLN: 5.0000 mg | INTRAMUSCULAR | Status: DC | PRN

## 2015-01-29 MED ORDER — ACETAMINOPHEN 325 MG PO TABS
650.0000 mg | ORAL_TABLET | Freq: Four times a day (QID) | ORAL | Status: DC | PRN
  Administered 2015-01-30 – 2015-02-01 (×3): 650 mg via ORAL
  Filled 2015-01-29 (×3): qty 2

## 2015-01-29 MED ORDER — MORPHINE SULFATE (PF) 2 MG/ML IV SOLN
1.0000 mg | INTRAVENOUS | Status: DC | PRN
  Administered 2015-01-29 – 2015-02-03 (×10): 1 mg via INTRAVENOUS
  Filled 2015-01-29 (×10): qty 1

## 2015-01-29 MED ORDER — INSULIN ASPART 100 UNIT/ML ~~LOC~~ SOLN
0.0000 [IU] | Freq: Every day | SUBCUTANEOUS | Status: DC
  Administered 2015-01-29: 3 [IU] via SUBCUTANEOUS
  Administered 2015-01-30: 4 [IU] via SUBCUTANEOUS
  Administered 2015-01-31: 2 [IU] via SUBCUTANEOUS

## 2015-01-29 MED ORDER — POTASSIUM CHLORIDE IN NACL 20-0.9 MEQ/L-% IV SOLN
INTRAVENOUS | Status: DC
  Administered 2015-01-29: 100 mL/h via INTRAVENOUS
  Administered 2015-01-30 – 2015-01-31 (×2): via INTRAVENOUS
  Filled 2015-01-29 (×10): qty 1000

## 2015-01-29 MED ORDER — SODIUM CHLORIDE 0.9 % IV BOLUS (SEPSIS)
1000.0000 mL | Freq: Once | INTRAVENOUS | Status: AC
  Administered 2015-01-29: 1000 mL via INTRAVENOUS

## 2015-01-29 MED ORDER — ENOXAPARIN SODIUM 30 MG/0.3ML ~~LOC~~ SOLN
30.0000 mg | SUBCUTANEOUS | Status: DC
  Administered 2015-01-29 – 2015-01-31 (×3): 30 mg via SUBCUTANEOUS
  Filled 2015-01-29 (×6): qty 0.3

## 2015-01-29 MED ORDER — SODIUM CHLORIDE 0.9 % IJ SOLN
3.0000 mL | Freq: Two times a day (BID) | INTRAMUSCULAR | Status: DC
  Administered 2015-01-30 – 2015-01-31 (×2): 3 mL via INTRAVENOUS

## 2015-01-29 MED ORDER — PROMETHAZINE HCL 25 MG/ML IJ SOLN
6.2500 mg | Freq: Four times a day (QID) | INTRAMUSCULAR | Status: DC | PRN
Start: 2015-01-29 — End: 2015-02-03

## 2015-01-29 MED ORDER — INSULIN ASPART 100 UNIT/ML ~~LOC~~ SOLN
0.0000 [IU] | Freq: Three times a day (TID) | SUBCUTANEOUS | Status: DC
  Administered 2015-01-29 (×2): 2 [IU] via SUBCUTANEOUS
  Administered 2015-01-30 (×2): 3 [IU] via SUBCUTANEOUS
  Administered 2015-01-30: 2 [IU] via SUBCUTANEOUS
  Administered 2015-01-31: 3 [IU] via SUBCUTANEOUS
  Administered 2015-01-31: 1 [IU] via SUBCUTANEOUS
  Administered 2015-01-31: 2 [IU] via SUBCUTANEOUS
  Administered 2015-02-01: 7 [IU] via SUBCUTANEOUS
  Administered 2015-02-01 – 2015-02-02 (×2): 2 [IU] via SUBCUTANEOUS
  Administered 2015-02-02 – 2015-02-03 (×2): 1 [IU] via SUBCUTANEOUS
  Administered 2015-02-03: 2 [IU] via SUBCUTANEOUS

## 2015-01-29 MED ORDER — ENOXAPARIN SODIUM 40 MG/0.4ML ~~LOC~~ SOLN: 40.0000 mg | SUBCUTANEOUS | Status: DC

## 2015-01-29 MED ORDER — SODIUM CHLORIDE 0.9 % IV SOLN: INTRAVENOUS | Status: DC

## 2015-01-29 MED ORDER — DEXTROSE 5 % IV SOLN
1.0000 g | INTRAVENOUS | Status: DC
  Administered 2015-01-30: 1 g via INTRAVENOUS
  Filled 2015-01-29: qty 10

## 2015-01-29 MED ORDER — ONDANSETRON HCL 4 MG/2ML IJ SOLN: 4.0000 mg | Freq: Four times a day (QID) | INTRAMUSCULAR | Status: DC | PRN
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
## 2015-01-29 NOTE — Progress Notes (Signed)
CT abdomen and pelvis revealed moderate to large scrotal hydrocele primarily on the right with severe bladder distention and diffuse bladder wall thickening with small diverticulum of the bladder fundus. This was associated with bilateral hydroureter and torturous dilated ureters with moderate bilateral hydronephrosis with mild perinephric stranding. Patient was also found to have a very enlarged prostate measuring up to 6.5 cm in diameter. I have ordered for nursing staff to place a Foley catheter with permission to use coude' catheter to ease passage of catheter in setting of enlarged prostate. He is currently being treated for a urinary tract infection and I have ordered a renal ultrasound for more formal evaluation of his hydronephrosis.  1515 Note 1800 cc urine obtained with insertion of catheter  Colin [name] , ANP

## 2015-01-29 NOTE — ED Notes (Signed)
Family reported that pt. complained of mid chest pain with SOB this evening , denies nausea or diaphoresis , history of CAD / CABG.

## 2015-01-29 NOTE — Progress Notes (Signed)
Utilization review completed.  
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
## 2015-01-29 NOTE — ED Notes (Signed)
The pt has very poor hygiene

## 2015-01-29 NOTE — ED Notes (Signed)
Patient's brief changed and linen changed prior to transporting pt off the floor to 2 west by this nurse.
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
## 2015-01-29 NOTE — Progress Notes (Signed)
A gentleman who introduced himself as "Byron" and "the patient's son-in-law", wanted information on the patient's status.  States he is the "primary caregiver for the patient at his residence".  His answers to questions were very inconsistent.  The patient's daughter was on speaker phone during our conversation re: his condition.  She expressed anger with the social worker over the 'insinuation' that he was not being properly cared for.  "What did y'all expect....that we give him a bath before we bring him to the hospital?" She was very defensive, stating the patient had been refusing all medications and food at home, yet there were inconsistencies upon further questioning.  The daughter stated that they would give him his Metformin medication "when his blood sugar was high", and later stated he had not been taking his Metformin for over 3 months.  When asked about his Coumadin, she stated that he "takes his medicines and throws them on the floor...Marland KitchenMarland KitchenMarland Kitchenhe won't take them."  It was observed on admission that this patient was in an unkempt state, requiring immediate bathing to quell the odor in the room brought on by his body odor.  Shaving revealed clumps of dirt in his beard, which was shaved off, and pus in his right eye (without redness).  This was removed during bathing.  A condom cath was placed to protect skin integument.
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
## 2015-01-29 NOTE — Care Management Note (Addendum)
Case Management Note Donn Pierini RN, BSN Unit 2W-Case Manager [phone]  Patient Details  Name: Colin [name] MRN: [id number] Date of Birth: 05/28/1933  Subjective/Objective:     Pt admitted with UTI, fever, and chest pain              Action/Plan:  PTA pt lived at home with daughter, unsure if family able to give the care to pt that he needs will have CSW assess and PT/OT evals to give recommendations. Per nursing pt was admitted in an "unkept" condition. NCM to continue to follow for d/c needs and planning.   Expected Discharge Date:                  Expected Discharge Plan:  Skilled Nursing Facility  In-House Referral:  Clinical Social Work  Discharge planning Services  CM Consult  Post Acute Care Choice:    Choice offered to:     DME Arranged:    DME Agency:     HH Arranged:    HH Agency:     Status of Service:  In process, will continue to follow  Medicare Important Message Given:    Date Medicare IM Given:    Medicare IM give by:    Date Additional Medicare IM Given:    Additional Medicare Important Message give by:     If discussed at Long Length of Stay Meetings, dates discussed:    Additional Comments:  01/29/15- spoke with son-n-law-Byron- pt has all needed DME at home that includes hospital bed, lift, w/c, and scooter. Per Ortencia Kick he and daughter provide all needed care for pt although at times pt refuses to take his meds/eat/bath. Pt had HH for a short time after he went to SNF last sept/2015 but since then family has been taking care of pt.   Darrold Span, RN 01/29/2015, 10:13 AM
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
## 2015-01-30 ENCOUNTER — Inpatient Hospital Stay (HOSPITAL_COMMUNITY): Payer: Medicare HMO

## 2015-01-30 DIAGNOSIS — R1031 Right lower quadrant pain: Secondary | ICD-10-CM

## 2015-01-30 DIAGNOSIS — N1330 Unspecified hydronephrosis: Secondary | ICD-10-CM

## 2015-01-30 DIAGNOSIS — E1165 Type 2 diabetes mellitus with hyperglycemia: Secondary | ICD-10-CM

## 2015-01-30 DIAGNOSIS — N320 Bladder-neck obstruction: Secondary | ICD-10-CM

## 2015-01-30 DIAGNOSIS — I10 Essential (primary) hypertension: Secondary | ICD-10-CM

## 2015-01-30 DIAGNOSIS — E871 Hypo-osmolality and hyponatremia: Secondary | ICD-10-CM

## 2015-01-30 DIAGNOSIS — R011 Cardiac murmur, unspecified: Secondary | ICD-10-CM

## 2015-01-30 DIAGNOSIS — R0789 Other chest pain: Secondary | ICD-10-CM

## 2015-01-30 DIAGNOSIS — G40909 Epilepsy, unspecified, not intractable, without status epilepticus: Secondary | ICD-10-CM

## 2015-01-30 DIAGNOSIS — Z86718 Personal history of other venous thrombosis and embolism: Secondary | ICD-10-CM

## 2015-01-30 DIAGNOSIS — N390 Urinary tract infection, site not specified: Secondary | ICD-10-CM

## 2015-01-30 DIAGNOSIS — F0390 Unspecified dementia without behavioral disturbance: Secondary | ICD-10-CM

## 2015-01-30 DIAGNOSIS — I4581 Long QT syndrome: Secondary | ICD-10-CM

## 2015-01-30 DIAGNOSIS — N138 Other obstructive and reflux uropathy: Secondary | ICD-10-CM

## 2015-01-30 DIAGNOSIS — N179 Acute kidney failure, unspecified: Secondary | ICD-10-CM

## 2015-01-30 DIAGNOSIS — N183 Chronic kidney disease, stage 3 (moderate): Secondary | ICD-10-CM

## 2015-01-30 LAB — COMPREHENSIVE METABOLIC PANEL
ALK PHOS: 44 U/L (ref 38–126)
ALT: 12 U/L — AB (ref 17–63)
AST: 14 U/L — AB (ref 15–41)
Albumin: 2.1 g/dL — ABNORMAL LOW (ref 3.5–5.0)
Anion gap: 9 (ref 5–15)
BUN: 21 mg/dL — AB (ref 6–20)
CALCIUM: 8.2 mg/dL — AB (ref 8.9–10.3)
CHLORIDE: 105 mmol/L (ref 101–111)
CO2: 26 mmol/L (ref 22–32)
CREATININE: 1.98 mg/dL — AB (ref 0.61–1.24)
GFR calc Af Amer: 35 mL/min — ABNORMAL LOW (ref >60)
GFR calc non Af Amer: 30 mL/min — ABNORMAL LOW (ref >60)
GLUCOSE: 214 mg/dL — AB (ref 65–99)
Potassium: 3.2 mmol/L — ABNORMAL LOW (ref 3.5–5.1)
SODIUM: 140 mmol/L (ref 135–145)
Total Bilirubin: 0.3 mg/dL (ref 0.3–1.2)
Total Protein: 6.1 g/dL — ABNORMAL LOW (ref 6.5–8.1)

## 2015-01-30 LAB — GLUCOSE, CAPILLARY
GLUCOSE-CAPILLARY: 202 mg/dL — AB (ref 65–99)
GLUCOSE-CAPILLARY: 216 mg/dL — AB (ref 65–99)
GLUCOSE-CAPILLARY: 162 mg/dL — AB (ref 65–99)
Glucose-Capillary: 326 mg/dL — ABNORMAL HIGH (ref 65–99)

## 2015-01-30 LAB — CBC
HCT: 27.9 % — ABNORMAL LOW (ref 39.0–52.0)
Hemoglobin: 9.2 g/dL — ABNORMAL LOW (ref 13.0–17.0)
MCH: 28.1 pg (ref 26.0–34.0)
MCHC: 33 g/dL (ref 30.0–36.0)
MCV: 85.3 fL (ref 78.0–100.0)
PLATELETS: 298 10*3/uL (ref 150–400)
RBC: 3.27 MIL/uL — ABNORMAL LOW (ref 4.22–5.81)
RDW: 14.1 % (ref 11.5–15.5)
WBC: 6.8 10*3/uL (ref 4.0–10.5)

## 2015-01-30 LAB — URINALYSIS, ROUTINE W REFLEX MICROSCOPIC
GLUCOSE, UA: 100 mg/dL — AB
KETONES UR: 15 mg/dL — AB
Nitrite: POSITIVE — AB
PH: 7 (ref 5.0–8.0)
Specific Gravity, Urine: 1.009 (ref 1.005–1.030)
Urobilinogen, UA: 1 mg/dL (ref 0.0–1.0)

## 2015-01-30 LAB — URINE CULTURE

## 2015-01-30 LAB — PROTIME-INR
INR: 1.29 (ref 0.00–1.49)
Prothrombin Time: 16.2 seconds — ABNORMAL HIGH (ref 11.6–15.2)

## 2015-01-30 LAB — URINE MICROSCOPIC-ADD ON

## 2015-01-30 LAB — HEMOGLOBIN A1C
HEMOGLOBIN A1C: 8.9 % — AB (ref 4.8–5.6)
MEAN PLASMA GLUCOSE: 209 mg/dL

## 2015-01-30 MED ORDER — TAMSULOSIN HCL 0.4 MG PO CAPS
0.4000 mg | ORAL_CAPSULE | Freq: Every day | ORAL | Status: DC
  Administered 2015-01-30 – 2015-02-02 (×4): 0.4 mg via ORAL
  Filled 2015-01-30 (×7): qty 1

## 2015-01-30 MED ORDER — VANCOMYCIN HCL IN DEXTROSE 750-5 MG/150ML-% IV SOLN
750.0000 mg | INTRAVENOUS | Status: DC
  Administered 2015-01-31 – 2015-02-01 (×2): 750 mg via INTRAVENOUS
  Filled 2015-01-30 (×3): qty 150

## 2015-01-30 MED ORDER — POLYETHYLENE GLYCOL 3350 17 G PO PACK
17.0000 g | PACK | Freq: Every day | ORAL | Status: DC
  Administered 2015-01-30 – 2015-02-03 (×5): 17 g via ORAL
  Filled 2015-01-30 (×6): qty 1

## 2015-01-30 MED ORDER — VANCOMYCIN HCL 10 G IV SOLR
1250.0000 mg | Freq: Once | INTRAVENOUS | Status: AC
  Administered 2015-01-30: 1250 mg via INTRAVENOUS
  Filled 2015-01-30 (×2): qty 1250

## 2015-01-30 MED ORDER — DEXTROSE 5 % IV SOLN
2.0000 g | INTRAVENOUS | Status: DC
  Administered 2015-01-31 – 2015-02-02 (×3): 2 g via INTRAVENOUS
  Filled 2015-01-30 (×3): qty 2

## 2015-01-30 MED ORDER — POTASSIUM CHLORIDE CRYS ER 20 MEQ PO TBCR
40.0000 meq | EXTENDED_RELEASE_TABLET | Freq: Two times a day (BID) | ORAL | Status: AC
  Administered 2015-01-30 (×2): 40 meq via ORAL
  Filled 2015-01-30 (×2): qty 2

## 2015-01-30 MED ORDER — ENSURE ENLIVE PO LIQD
237.0000 mL | Freq: Two times a day (BID) | ORAL | Status: DC
  Administered 2015-01-30 – 2015-02-01 (×5): 237 mL via ORAL

## 2015-01-30 MED ORDER — INSULIN DETEMIR 100 UNIT/ML ~~LOC~~ SOLN
5.0000 [IU] | Freq: Two times a day (BID) | SUBCUTANEOUS | Status: DC
  Administered 2015-01-30 (×2): 5 [IU] via SUBCUTANEOUS
  Filled 2015-01-30 (×4): qty 0.05

## 2015-01-30 NOTE — Evaluation (Signed)
Occupational Therapy Evaluation Patient Details Name: Colin [name] MRN: [id number] DOB: 03/18/1933 Today's Date: 01/30/2015    History of Present Illness Pt is an 81 y/o male with a PMH of CAD with prior CABG, dementia, stroke, CKD-III, DM, HTN, and apparent history of DVT. Pt was brought to the ED by his family with reports of chest pain. The family states that at baseline the pt is nonambulatory.    Clinical Impression   Plan is for patient to discharge > SNF. No acute OT needs identified, all needs can be met in SNF. Please send text page to OT services if any questions, concerns, or with new orders: [phone] OR call office at [phone].   Likely, pt is close to baseline. No family present during OT/PT evaluation and pt presents as a poor historian.   If d/c plan changes, please contact OT department for new evaluation and education to family as needed and as appropriate. Thank you for the order.      Follow Up Recommendations  SNF;Supervision/Assistance - 24 hour    Equipment Recommendations  Other (comment) (TBD next venue of care)    Recommendations for Other Services  None at this time  Precautions / Restrictions Precautions Precautions: Fall Restrictions Weight Bearing Restrictions: No    Mobility Bed Mobility Overal bed mobility: Needs Assistance;+2 for physical assistance Bed Mobility: Sidelying to Sit   Sidelying to sit: Mod assist;+2 for physical assistance       General bed mobility comments: Pt was received on his R side in the bed. Pt required assist to initiate movement of LE's to EOB and bed pad was used to assist with scooting. Min assist +2 provided to elevate trunk to full sitting position.   Transfers Overall transfer level: Needs assistance Equipment used: 2 person hand held assist Transfers: Sit to/from [facility] Sit to Stand: Mod assist;+2 physical assistance Stand pivot transfers: Mod assist;+2 physical assistance       General transfer comment: Pt was able to power-up to full standing position with +2 assist for balance/support. Bed pad was used under hips for extra support. Pt was able to take a few pivotal steps around to the recliner.     Balance Overall balance assessment: Needs assistance Sitting-balance support: Feet supported;Bilateral upper extremity supported Sitting balance-Leahy Scale: Poor   Postural control: Posterior lean;Right lateral lean;Left lateral lean Standing balance support: Bilateral upper extremity supported Standing balance-Leahy Scale: Poor Standing balance comment: holding onto therapists during standing    ADL Overall ADL's : Needs assistance/impaired General ADL Comments: Pt overall total assist with ADLs from bed level. Pt able to sit EOB prior to transfer EOB>recliner. Pt would moan and scream out, secondary to pain? Agitation? Unsure of patient's baseline. According to chart, pt's daughter and son-in-law were assisting him PTA.     Pertinent Vitals/Pain Pain Assessment: Faces Faces Pain Scale: Hurts even more Pain Location: Pt indicated that his bottom was hurting, and then when asked about it later, pt states his bottom never hurts. Also reports his abdomen is hurting. Pain Descriptors / Indicators: Grimacing;Guarding Pain Intervention(s): Limited activity within patient's tolerance;Monitored during session;Repositioned     Hand Dominance Right   Extremity/Trunk Assessment Upper Extremity Assessment Upper Extremity Assessment: Generalized weakness (pt unable to follow commands consistently, difficult to gather full UE assessment)   Lower Extremity Assessment Lower Extremity Assessment: Defer to PT evaluation   Cervical / Trunk Assessment Cervical / Trunk Assessment: Kyphotic   Communication Communication Communication: Expressive difficulties  Cognition Arousal/Alertness: Lethargic Behavior During Therapy: Flat affect (minor agitation with  questions) Overall Cognitive Status: History of cognitive impairments - at baseline              Home Living Family/patient expects to be discharged to:: Skilled nursing facility Additional Comments: Pt lives with daughter and son-in-law per previous admission note. Pt unable to provide history and no family available at the time of eval.       Prior Functioning/Environment Level of Independence: Needs assistance  Gait / Transfers Assistance Needed: From a previous admission note: nonambulatory; son-in-law "lifts and carries pt"  ADL's / Homemaking Assistance Needed: total A. set up for feeding        OT Diagnosis: Generalized weakness   OT Problem List:  n/a, no acute OT needs identified    OT Treatment/Interventions:   n/a, no acute OT needs identified    OT Goals(Current goals can be found in the care plan section) Acute Rehab OT Goals Patient Stated Goal: none stated OT Goal Formulation: All assessment and education complete, DC therapy (defer OT services > SNF)  OT Frequency:   n/a, no acute OT needs identified    Barriers to D/C:  None known at this time        Co-evaluation PT/OT/SLP Co-Evaluation/Treatment: Yes Reason for Co-Treatment: For patient/therapist safety;Complexity of the patient's impairments (multi-system involvement) PT goals addressed during session: Mobility/safety with mobility;Balance OT goals addressed during session: ADL's and self-care;Strengthening/ROM      End of Session Activity Tolerance: Treatment limited secondary to agitation Patient left: in chair;with call bell/phone within reach;with chair alarm set   Time: 1027-2536 OT Time Calculation (min): 27 min Charges:  OT General Charges $OT Visit: 1 Procedure OT Evaluation $Initial OT Evaluation Tier I: 1 Procedure  Siarra Gilkerson , MS, OTR/L, CLT Pager: [phone]  01/30/2015, 1:11 PM

## 2015-01-30 NOTE — Progress Notes (Signed)
  Echocardiogram 2D Echocardiogram has been performed.  Arvil Chaco 01/30/2015, 1:09 PM

## 2015-01-30 NOTE — Progress Notes (Signed)
Md aware of gram positive  blood cultures which was called in to me by the lab.  Cova Knieriem, Charlaine Dalton RN

## 2015-01-30 NOTE — Progress Notes (Signed)
Enema not given patient had a bowel movement.   Addysin Porco, Charlaine Dalton RN

## 2015-01-30 NOTE — Progress Notes (Addendum)
Inpatient Diabetes Program Recommendations  AACE/ADA: New Consensus Statement on Inpatient Glycemic Control (2013)  Target Ranges:  Prepandial:   less than 140 mg/dL      Peak postprandial:   less than 180 mg/dL (1-2 hours)      Critically ill patients:  140 - 180 mg/dL   Results for GORMAN, SAFI (MRN [id number]) as of 01/30/2015 10:05  Ref. Range 01/29/2015 01:33 01/29/2015 11:08 01/29/2015 16:28 01/29/2015 20:53 01/30/2015 06:15  Glucose-Capillary Latest Ref Range: 65-99 mg/dL 562 (H) 130 (H) 865 (H) 270 (H) 202 (H)   Reason for Admission: CP/+ UTI  Diabetes history: DM 2  Outpatient Diabetes medications: None Current orders for Inpatient glycemic control: Novolog Sensitive + HS scale  Inpatient Diabetes Program Recommendations Insulin - Basal: Patient received a total amount of 12 units of Novolog yesterday, may want to consider starting Lantus 6 units Q 24hrs since fasting is still elevated at 202 this am.    Note: A1c was 8.9% on 01/29/2015, increased glucose at home. Patient may need to be placed back on Metformin at time of discharge and follow up with his PCP.  Thanks,  Christena Deem RN, MSN, [facility] Inpatient Diabetes Coordinator Team Pager [phone]

## 2015-01-30 NOTE — Progress Notes (Signed)
Initial Nutrition Assessment  DOCUMENTATION CODES:   Not applicable  INTERVENTION:   Continue Ensure Enlive po BID, each supplement provides 350 kcal and 20 grams of protein  NUTRITION DIAGNOSIS:   Predicted suboptimal nutrient intake related to dysphagia, lethargy/confusion as evidenced by  (chart review)  GOAL:   Patient will meet greater than or equal to 90% of their needs  MONITOR:   PO intake, Supplement acceptance, Labs, Weight trends, I & O's  REASON FOR ASSESSMENT:   Consult Assessment of nutrition requirement/status  ASSESSMENT:   81 y.o. Male with a PMH of CAD with prior CABG, dementia, stroke, CKD-III, DM, HTN, and apparent history of DVT. Pt was brought to the ED by his family with reports of chest pain.  Pt unable to speak given dysarthria.  RN's caring for pt during RD visit.  S/p swallow evaluation 8/31.  Pt presented with normal oropharyngeal swallow.  Diet advanced to Soft.  Expect PO intake to be suboptimal.  Ensure Enlive oral nutrition supplements in place.  RD unable to complete Nutrition Focused Physical Exam at this time.  Diet Order:  DIET SOFT Room service appropriate?: Yes; Fluid consistency:: Thin  Skin:  Reviewed, no issues  Last BM:  8/31  Height:   Ht Readings from Last 1 Encounters:  01/29/15  (1.651 m)    Weight:   Wt Readings from Last 1 Encounters:  01/30/15 137 lb 9.1 oz (62.4 kg)    Ideal Body Weight:  56.8 kg  BMI:  Body mass index is 22.89 kg/(m^2).  Estimated Nutritional Needs:   Kcal:  1500-1700  Protein:  70-80 gm  Fluid:  1.5-1.7 L  EDUCATION NEEDS:   No education needs identified at this time  Maureen Chatters, RD, LDN Pager #: [phone] After-Hours Pager #: [phone]
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
## 2015-01-30 NOTE — Evaluation (Signed)
Physical Therapy Evaluation Patient Details Name: Colin [name] MRN: [id number] DOB: [birth year] Today's Date: 01/30/2015   History of Present Illness  Pt is an 81 y/o male with a PMH of CAD with prior CABG, dementia, stroke, CKD-III, DM, HTN, and apparent history of DVT. Pt was brought to the ED by his family with reports of chest pain. The family states that at baseline the pt is nonambulatory.   Clinical Impression  Pt admitted with above diagnosis. Pt currently with functional limitations due to the deficits listed below (see PT Problem List). At the time of PT eval pt was able to perform transfers with +2 assist for safety, balance, and support. Pt gave a limited history and it is unclear if information is reliable. No family present during session. Strength is grossly decreased with poor tolerance for functional activity. At baseline it appears that pt is non-ambulatory, but is able to complete transfers with assist. Pt is likely close to his baseline of function at this time. Pt will benefit from skilled PT to increase their independence and safety with mobility to allow discharge to the venue listed below.       Follow Up Recommendations SNF;Supervision/Assistance - 24 hour    Equipment Recommendations  Wheelchair (measurements PT);Wheelchair cushion (measurements PT)    Recommendations for Other Services       Precautions / Restrictions Precautions Precautions: Fall Restrictions Weight Bearing Restrictions: No      Mobility  Bed Mobility Overal bed mobility: Needs Assistance;+2 for physical assistance Bed Mobility: Sidelying to Sit   Sidelying to sit: Mod assist;+2 for physical assistance       General bed mobility comments: Pt was received on his R side in the bed. Pt required assist to initiate movement of LE's to EOB and bed pad was used to assist with scooting. Min assist +2 provided to elevate trunk to full sitting position.   Transfers Overall transfer level:  Needs assistance Equipment used: 2 person hand held assist Transfers: Sit to/from [employer] Sit to Stand: Mod assist;+2 physical assistance Stand pivot transfers: Mod assist;+2 physical assistance       General transfer comment: Pt was able to power-up to full standing position with +2 assist for balance/support. Bed pad was used under hips for extra support. Pt was able to take a few pivotal steps around to the recliner.   Ambulation/Gait         Gait velocity: Decreased Gait velocity interpretation: Below normal speed for age/gender General Gait Details: Unable - it appears that pt is non-ambulatory at baseline  Stairs            Wheelchair Mobility    Modified Rankin (Stroke Patients Only)       Balance Overall balance assessment: Needs assistance Sitting-balance support: Feet supported;Bilateral upper extremity supported Sitting balance-Leahy Scale: Poor   Postural control: Posterior lean;Right lateral lean;Left lateral lean Standing balance support: Bilateral upper extremity supported Standing balance-Leahy Scale: Poor                               Pertinent Vitals/Pain Pain Assessment: Faces Faces Pain Scale: Hurts even more Pain Location: Pt indicated that his bottom was hurting, and then when asked about it later, pt states his bottom never hurts. Also reports his abdomen is hurting.  Pain Descriptors / Indicators: Grimacing;Guarding Pain Intervention(s): Limited activity within patient's tolerance;Monitored during session;Repositioned    Home Living Family/patient expects to be  discharged to:: Skilled nursing facility                 Additional Comments: Pt lives with daughter and son-in-law per previous admission note. Pt unable to provide history and no family available at the time of eval.     Prior Function Level of Independence: Needs assistance   Gait / Transfers Assistance Needed: From a previous admission  note: nonambulatory; son-in-law "lifts and carries pt"   ADL's / Homemaking Assistance Needed: total A. set up for feeding        Hand Dominance   Dominant Hand: Right    Extremity/Trunk Assessment   Upper Extremity Assessment: Defer to OT evaluation           Lower Extremity Assessment: Generalized weakness (Limited AROM noted during session. )      Cervical / Trunk Assessment: Kyphotic  Communication   Communication: Expressive difficulties  Cognition Arousal/Alertness: Lethargic Behavior During Therapy: Flat affect (Minor agitation with questioning) Overall Cognitive Status: History of cognitive impairments - at baseline                      General Comments      Exercises        Assessment/Plan    PT Assessment Patient needs continued PT services  PT Diagnosis Generalized weakness   PT Problem List Decreased strength;Decreased range of motion;Decreased activity tolerance;Decreased balance;Decreased mobility;Decreased knowledge of use of DME;Decreased safety awareness;Decreased knowledge of precautions;Pain  PT Treatment Interventions DME instruction;Gait training;Functional mobility training;Therapeutic activities;Therapeutic exercise;Neuromuscular re-education;Patient/family education   PT Goals (Current goals can be found in the Care Plan section) Acute Rehab PT Goals PT Goal Formulation: Patient unable to participate in goal setting Time For Goal Achievement: 02/13/15 Potential to Achieve Goals: Fair    Frequency Min 2X/week   Barriers to discharge Decreased caregiver support Pt presented to the ED in a state that would suggest poor hygiene and lack of family support at home. Pt may be refusing care at home as well.     Co-evaluation PT/OT/SLP Co-Evaluation/Treatment: Yes Reason for Co-Treatment: For patient/therapist safety PT goals addressed during session: Mobility/safety with mobility;Balance         End of Session Equipment  Utilized During Treatment: Gait belt Activity Tolerance: Patient limited by fatigue;Patient limited by lethargy Patient left: in chair;with call bell/phone within reach;with chair alarm set Nurse Communication: Mobility status         Time: 6578-4696 PT Time Calculation (min) (ACUTE ONLY): 27 min   Charges:   PT Evaluation $Initial PT Evaluation Tier I: 1 Procedure     PT G Codes:        Conni Slipper 02-17-2015, 12:27 PM   Conni Slipper, PT, DPT Acute Rehabilitation Services Pager: [phone]

## 2015-01-30 NOTE — Progress Notes (Addendum)
[facility] PROGRESS NOTE  Assessment/Plan: Atypical chest pain: - No events on telemetry, cardiac biomarkers negative 3, EKG showed normal sinus rhythm normal axis. - Was given GI cocktail with improvement.  Bacteremia: - Blood cultures grew 2 out of 2 gram-positive cocci in clusters, I will go ahead and start vancomycin empirically. - We'll get a echocardiogram and will probably need a TEE. - Repeat blood cultures in the morning.   Acute renal failure superimposed on stage 3 chronic kidney disease /Obstructive nephropathy with UTI (lower urinary tract infection) - He started empirically on IV Rocephin, fully had to be placed due to urinary retention. Urine cultures are pending. - He put out about 4 L of urine, will recheck his UA for blood - Continue to monitor strict I's and O's, and creatinine is now improving, as he is now in oliguric phase. - Foley in place on 8.31.2016, start Flomax.   Hyponatremia/Hypokalemia: Replete potassium orally. Hyponatremia has resolved.  RLQ abdominal pain: - CT scan of the abdomen and pelvis was negative for acute appendicitis likely due to obstructive uropathy. Urinary bladder significantly distended, I ve reviewed the CT scan personally.  Dementia/history of stroke and dysphagia: Nonambulatory at baseline unable to speak due to dysarthria. Swallowing evaluation recommended regular solids and thin liquid . Patient seemed to disheveled, so Child psychotherapist was consulted.  Uncontrolled diabetes mellitus type 2: Hemoglobin A1c was 8.9 continue sliding scale insulin. Add low-dose like long-acting insulin  Seizure disorder: Continue Keppra no sinus seizures.   Iron deficiency anemia: Hemoglobin appears to be at baseline.  Essential hypertension  Severe protein caloric malnutrition: Ensure 3 times a day. Consult nutritionist.  Anemia, iron deficiency: - Continue iron therapy 3 times a day.  Prolonged QT: due to  hypokalemia.  Right lower extremity DVT: Diagnosed on 01/31/2014, INR subtherapeutic, cannot start IV heparin and Coumadin until his hematuria improves and oral resolves. He is on lifelong Coumadin. His hemoglobin has remained stable since 2015.  Code Status: none Family Communication: none  Disposition Plan: inpatient   Consultants:  none  Procedures: Renal ultrasound on 01/29/2015 Moderate bilateral hydronephrosis. 2. Somewhat echogenic renal parenchyma suggesting chronic renal medical disease.  Thickened urinary bladder wall suggesting a degree of bladder obstruction with enlargement of the prostate.  Antibiotics:  Rocephin 8.31.2016  HPI/Subjective: When I went in the room the patient pretended to be sleeping, I did not proceed with questioning he did allowed his physical exam.  Objective: Filed Vitals:   01/29/15 1419 01/29/15 2033 01/30/15 0342 01/30/15 0400  BP: 119/86 164/59 188/86 133/89  Pulse:  93 91 87  Temp: 98.3 F (36.8 C) 99 F (37.2 C) 98.4 F (36.9 C)   TempSrc: Oral Oral Oral   Resp: 17 18 20    Height:      Weight:    62.4 kg (137 lb 9.1 oz)  SpO2: 99% 98% 100% 98%    Intake/Output Summary (Last 24 hours) at 01/30/15 0959 Last data filed at 01/30/15 0947  Gross per 24 hour  Intake   1877 ml  Output   5300 ml  Net  -3423 ml   Filed Weights   01/29/15 1000 01/30/15 0400  Weight: 62.7 kg (138 lb 3.7 oz) 62.4 kg (137 lb 9.1 oz)    Exam:  General: in no acute distress.  HEENT: No bruits, no goiter.  Heart: Regular rate and rhythm. Lungs: Good air movement, clear Abdomen: Soft, nontender, nondistended, positive bowel sounds.  Neuro: Grossly intact, nonfocal.  Data Reviewed: Basic Metabolic Panel:  Recent Labs Lab 01/29/15 0137 01/30/15 0517  NA 131* 140  K 3.1* 3.2*  CL 94* 105  CO2 28 26  GLUCOSE 248* 214*  BUN 25* 21*  CREATININE 2.00* 1.98*  CALCIUM 8.8* 8.2*   Liver Function Tests:  Recent Labs Lab 01/30/15 0517   AST 14*  ALT 12*  ALKPHOS 44  BILITOT 0.3  PROT 6.1*  ALBUMIN 2.1*    Recent Labs Lab 01/29/15 0853  LIPASE 21*   No results for input(s): AMMONIA in the last 168 hours. CBC:  Recent Labs Lab 01/29/15 0137 01/30/15 0517  WBC 8.1 6.8  HGB 9.7* 9.2*  HCT 29.7* 27.9*  MCV 85.1 85.3  PLT 297 298   Cardiac Enzymes:  Recent Labs Lab 01/29/15 0853 01/29/15 1621 01/29/15 2246  TROPONINI <0.03 <0.03 <0.03   BNP (last 3 results) No results for input(s): BNP in the last 8760 hours.  ProBNP (last 3 results) No results for input(s): PROBNP in the last 8760 hours.  CBG:  Recent Labs Lab 01/29/15 0133 01/29/15 1108 01/29/15 1628 01/29/15 2053 01/30/15 0615  GLUCAP 248* 167* 165* 270* 202*    Recent Results (from the past 240 hour(s))  Blood culture (routine x 2)     Status: None (Preliminary result)   Collection Time: 01/29/15  3:00 AM  Result Value Ref Range Status   Specimen Description BLOOD RIGHT ARM  Final   Special Requests BOTTLES DRAWN AEROBIC ONLY 10CC  Final   Culture  Setup Time   Final    GRAM POSITIVE COCCI IN CLUSTERS AEROBIC BOTTLE ONLY CRITICAL RESULT CALLED TO, READ BACK BY AND VERIFIED WITH: Tilden Fossa RN [id number] 01/29/15 A BROWNING    Culture PENDING  Incomplete   Report Status PENDING  Incomplete  Blood culture (routine x 2)     Status: None (Preliminary result)   Collection Time: 01/29/15  3:15 AM  Result Value Ref Range Status   Specimen Description BLOOD LEFT ARM  Final   Special Requests BOTTLES DRAWN AEROBIC AND ANAEROBIC 5CC  Final   Culture  Setup Time   Final    GRAM POSITIVE COCCI IN CLUSTERS AEROBIC BOTTLE ONLY CRITICAL RESULT CALLED TO, READ BACK BY AND VERIFIED WITH: B CARAHER,RN AT 5409 01/30/15 BY L BENFIELD    Culture GRAM POSITIVE COCCI  Final   Report Status PENDING  Incomplete     Studies: Ct Abdomen Pelvis Wo Contrast  01/29/2015   CLINICAL DATA:  81 year old male with chest pain for 2 days and right lower quadrant  abdominal pain. Initial encounter.  EXAM: CT ABDOMEN AND PELVIS WITHOUT CONTRAST  TECHNIQUE: Multidetector CT imaging of the abdomen and pelvis was performed following the standard protocol without IV contrast.  COMPARISON:  CT Abdomen and Pelvis 08/11/2009.  FINDINGS: Negative lung bases. No pericardial or pleural effusion. Sequelae of CABG.  Multilevel chronic left posterior and lateral rib fractures. Osteopenia. No acute osseous abnormality identified.  Partially visualized moderate to large scrotal hydrocele mostly on the right. No pelvic free fluid. Severe bladder distension. Diffuse bladder wall thickening. Small diverticulum at the bladder fundus (series 2, image 49). Associated bilateral hydroureter. Tortuous, dilated ureters. Moderate bilateral hydronephrosis. Mild perinephric stranding. The prostate is enlarged measuring up to 6.5 cm diameter. No definite urologic calculus ; tiny calcifications about both renal pelves appear to be vascular related.  Stool mixed with contrast throughout the colon. Mild diverticulosis of the descending colon. Negative appendix. No dilated small bowel.  Mostly decompressed stomach. Decompressed duodenum.  Noncontrast liver, gallbladder, spleen, pancreas and adrenal glands are within normal limits. No abdominal free fluid. Aortoiliac calcified atherosclerosis noted. No lymphadenopathy identified.  IMPRESSION: 1. Severe bladder distension (estimated volume 770 mL) with moderate to severe bilateral hydronephrosis and hydroureter. Diffuse bladder wall thickening. Small diverticulum at the fundus. Enlarged prostate up to 6.5 cm diameter. 2. No other acute or inflammatory process identified in the abdomen or pelvis. Negative appendix.   Electronically Signed   By: Odessa Fleming M.D.   On: 01/29/2015 13:20   Dg Chest 2 View  01/29/2015   CLINICAL DATA:  Acute onset of left-sided chest pain. Initial encounter.  EXAM: CHEST  2 VIEW  COMPARISON:  Chest radiograph performed 02/13/2014   FINDINGS: The lungs are well-aerated and clear. There is no evidence of focal opacification, pleural effusion or pneumothorax.  The heart is borderline normal in size. The patient is status post median sternotomy, with evidence of prior CABG. No acute osseous abnormalities are seen.  IMPRESSION: No acute cardiopulmonary process seen.   Electronically Signed   By: Roanna Raider M.D.   On: 01/29/2015 03:22   US Renal  01/29/2015   CLINICAL DATA:  Acute urinary retention, diabetes, hypertension  EXAM: RENAL / URINARY TRACT ULTRASOUND COMPLETE  COMPARISON:  CT abdomen pelvis of 01/29/2015  FINDINGS: Right Kidney:  Length: 11.8 cm. There is moderate hydronephrosis present. The parenchyma of the kidney is somewhat echogenic which may indicate chronic renal medical disease.  Left Kidney:  Length: 10.7 cm. Moderate hydronephrosis is also noted on the left. The left renal parenchymal also is echogenic suggesting chronic renal medical disease.  Bladder:  The urinary bladder is moderately distended and somewhat thick-walled suggesting a degree of bladder outlet obstruction. There is an enlarged prostate with significant hypertrophy of the median lobe. No intrinsic bladder lesion is seen.  IMPRESSION: 1. Moderate bilateral hydronephrosis. 2. Somewhat echogenic renal parenchyma suggesting chronic renal medical disease. 3. Thickened urinary bladder wall suggesting a degree of bladder obstruction with enlargement of the prostate.   Electronically Signed   By: Dwyane Dee M.D.   On: 01/29/2015 16:02    Scheduled Meds: . cefTRIAXone (ROCEPHIN)  IV  1 g Intravenous Q24H  . enoxaparin (LOVENOX) injection  30 mg Subcutaneous Q24H  . insulin aspart  0-5 Units Subcutaneous QHS  . insulin aspart  0-9 Units Subcutaneous TID WC  . sodium chloride  3 mL Intravenous Q12H  . vancomycin  1,250 mg Intravenous Once   Followed by  . [START ON 01/31/2015] vancomycin  750 mg Intravenous Q24H   Continuous Infusions: . 0.9 % NaCl with  KCl 20 mEq / L 100 mL/hr at 01/30/15 0202    Time Spent: 35 min   Marinda Elk  [facility] Pager [phone]. If 7PM-7AM, please contact night-coverage at [email or web address], password [facility] 01/30/2015, 9:59 AM  LOS: 1 day

## 2015-01-31 DIAGNOSIS — E876 Hypokalemia: Secondary | ICD-10-CM

## 2015-01-31 LAB — BASIC METABOLIC PANEL
ANION GAP: 7 (ref 5–15)
BUN: 19 mg/dL (ref 6–20)
CALCIUM: 8.1 mg/dL — AB (ref 8.9–10.3)
CHLORIDE: 106 mmol/L (ref 101–111)
CO2: 26 mmol/L (ref 22–32)
Creatinine, Ser: 1.79 mg/dL — ABNORMAL HIGH (ref 0.61–1.24)
GFR calc Af Amer: 40 mL/min — ABNORMAL LOW (ref >60)
GFR calc non Af Amer: 34 mL/min — ABNORMAL LOW (ref >60)
GLUCOSE: 252 mg/dL — AB (ref 65–99)
Potassium: 4.1 mmol/L (ref 3.5–5.1)
Sodium: 139 mmol/L (ref 135–145)

## 2015-01-31 LAB — PSA, TOTAL AND FREE
PSA FREE PCT: 19.6 %
PSA FREE: 1.06 ng/mL
Prostate Specific Ag, Serum: 5.4 ng/mL — ABNORMAL HIGH (ref 0.0–4.0)

## 2015-01-31 LAB — CBC
HEMATOCRIT: 30.4 % — AB (ref 39.0–52.0)
HEMOGLOBIN: 9.7 g/dL — AB (ref 13.0–17.0)
MCH: 27.6 pg (ref 26.0–34.0)
MCHC: 31.9 g/dL (ref 30.0–36.0)
MCV: 86.4 fL (ref 78.0–100.0)
Platelets: 275 10*3/uL (ref 150–400)
RBC: 3.52 MIL/uL — AB (ref 4.22–5.81)
RDW: 14.3 % (ref 11.5–15.5)
WBC: 6.9 10*3/uL (ref 4.0–10.5)

## 2015-01-31 LAB — GLUCOSE, CAPILLARY
GLUCOSE-CAPILLARY: 146 mg/dL — AB (ref 65–99)
GLUCOSE-CAPILLARY: 220 mg/dL — AB (ref 65–99)
GLUCOSE-CAPILLARY: 211 mg/dL — AB (ref 65–99)
Glucose-Capillary: 174 mg/dL — ABNORMAL HIGH (ref 65–99)

## 2015-01-31 LAB — PROTIME-INR
INR: 1.17 (ref 0.00–1.49)
Prothrombin Time: 15.1 seconds (ref 11.6–15.2)

## 2015-01-31 MED ORDER — SODIUM CHLORIDE 0.9 % IV SOLN
INTRAVENOUS | Status: DC
Start: 2015-01-31 — End: 2015-02-01
  Administered 2015-01-31 – 2015-02-01 (×2): via INTRAVENOUS

## 2015-01-31 MED ORDER — SODIUM CHLORIDE 0.9 % IV BOLUS (SEPSIS)
500.0000 mL | Freq: Once | INTRAVENOUS | Status: AC
  Administered 2015-01-31: 500 mL via INTRAVENOUS

## 2015-01-31 MED ORDER — INSULIN DETEMIR 100 UNIT/ML ~~LOC~~ SOLN
10.0000 [IU] | Freq: Two times a day (BID) | SUBCUTANEOUS | Status: DC
  Administered 2015-01-31 – 2015-02-03 (×6): 10 [IU] via SUBCUTANEOUS
  Filled 2015-01-31 (×7): qty 0.1

## 2015-01-31 NOTE — Care Management Important Message (Signed)
Important Message  Patient Details  Name: Deldrick Linch MRN: [id number] Date of Birth: [birth year]   Medicare Important Message Given:  [facility] notification given    Kyla Balzarine 01/31/2015, 10:35 AMImportant Message  Patient Details  Name: Daymon Hora MRN: [id number] Date of Birth: 1933/01/26   Medicare Important Message Given:  Yes-second notification given    Kyla Balzarine 01/31/2015, 10:35 AM

## 2015-01-31 NOTE — Progress Notes (Addendum)
CSW attempted to call pt daughter- both phone lines were not working and unable to leave message.  CSW called pt fiance Bryon- he stated he would ask pt dtr to return call- did not receive call, called back and no answer.  CSW called pt daughter later in the afternoon- she answered and CSW introduced self- line was disconnected- CSW attempted to call multiple times from land line and from cell phone- no answer or would go straight to voicemail.  CSW will continue to follow- will ask weekend social worker to attempted to follow up with family member  Merlyn Lot, [facility] Clinical Social Worker [phone]

## 2015-01-31 NOTE — Progress Notes (Signed)
[facility] PROGRESS NOTE  Assessment/Plan: Atypical chest pain: - No events on telemetry, cardiac biomarkers negative 3, EKG showed normal sinus rhythm normal axis. - Was given GI cocktail with improvement.  Bacteremia: - Blood cultures grew 2 out of 2 gram-positive cocci in clusters, vancomycin started empirically on 01/30/2015 - 2-D echocardiogram showed an EF of 60% with grade 1 diastolic heart failure no vegetations.  - Repeat blood cultures in the morning.   Acute renal failure superimposed on stage 3 chronic kidney disease /Obstructive nephropathy with UTI (lower urinary tract infection)/hematuria - He started empirically on IV Rocephin 8.31.2016, foley had to be placed due to urinary retention. - Urine cultures are pending. - He continues to per large amounts of urine, I will go ahead and increase his IV hydration to try to keep up. - Continue to monitor strict I's and O's, and creatinine is now improving. - Foley in place on 8.31.2016, start Flomax.   Hyponatremia/Hypokalemia: Resolved with IV hydration.  RLQ abdominal pain: - CT scan of the abdomen and pelvis was negative for acute appendicitis likely due to obstructive uropathy. Urinary bladder significantly distended, I have reviewed the CT scan personally.  Dementia/history of stroke and dysphagia: Nonambulatory at baseline. Swallowing evaluation recommended regular solids and thin liquid . Patient seemed to disheveled, so Child psychotherapist was consulted.  Uncontrolled diabetes mellitus type 2: Hemoglobin A1c was 8.9 continue sliding scale insulin. Glucose continues to be high will increase long-acting insulin.  Seizure disorder: Continue Keppra no sinus seizures.   Iron deficiency anemia: Hemoglobin appears to be at baseline.  Essential hypertension  Severe protein caloric malnutrition: Ensure 3 times a day. Consult nutritionist.  Anemia, iron deficiency: - Continue iron therapy 3 times a  day.  Prolonged QT: due to hypokalemia.  Right lower extremity DVT: Diagnosed on 01/31/2014, INR subtherapeutic, cannot start IV heparin and Coumadin until his hematuria improves. He will need lifelong Coumadin. His hemoglobin has remained stable since 2015.  Code Status: none Family Communication: none  Disposition Plan: inpatient   Consultants:  none  Procedures: Renal ultrasound on 01/29/2015 Moderate bilateral hydronephrosis. 2. Somewhat echogenic renal parenchyma suggesting chronic renal medical disease.  Thickened urinary bladder wall suggesting a degree of bladder obstruction with enlargement of the prostate.  Antibiotics:  Rocephin 8.31.2016  HPI/Subjective: Patient relates has no new complains.  Objective: Filed Vitals:   01/30/15 0400 01/30/15 1327 01/30/15 2134 01/31/15 0309  BP: 133/89 151/53 122/96 128/70  Pulse: 87 52 98 85  Temp:  98.4 F (36.9 C) 98.1 F (36.7 C) 98 F (36.7 C)  TempSrc:  Oral Oral Oral  Resp:  19 18 24   Height:      Weight: 62.4 kg (137 lb 9.1 oz)     SpO2: 98% 100% 100% 100%    Intake/Output Summary (Last 24 hours) at 01/31/15 1025 Last data filed at 01/31/15 0808  Gross per 24 hour  Intake   1420 ml  Output   3278 ml  Net  -1858 ml   Filed Weights   01/29/15 1000 01/30/15 0400  Weight: 62.7 kg (138 lb 3.7 oz) 62.4 kg (137 lb 9.1 oz)    Exam:  General: in no acute distress. He is awake alert and oriented 2 HEENT: No bruits, no goiter.  Heart: Regular rate and rhythm. Lungs: Good air movement, clear Abdomen: Soft, nontender, nondistended, positive bowel sounds.  Neuro: Grossly intact, nonfocal.   Data Reviewed: Basic Metabolic Panel:  Recent Labs Lab 01/29/15 0137 01/30/15 0517  01/31/15 0034  NA 131* 140 139  K 3.1* 3.2* 4.1  CL 94* 105 106  CO2 GLUCOSE 248* 214* 252*  BUN 25* 21* 19  CREATININE 2.00* 1.98* 1.79*  CALCIUM 8.8* 8.2* 8.1*   Liver Function Tests:  Recent Labs Lab  01/30/15 0517  AST 14*  ALT 12*  ALKPHOS 44  BILITOT 0.3  PROT 6.1*  ALBUMIN 2.1*    Recent Labs Lab 01/29/15 0853  LIPASE 21*   No results for input(s): AMMONIA in the last 168 hours. CBC:  Recent Labs Lab 01/29/15 0137 01/30/15 0517 01/31/15 0034  WBC 8.1 6.8 6.9  HGB 9.7* 9.2* 9.7*  HCT 29.7* 27.9* 30.4*  MCV 85.1 85.3 86.4  PLT 297 298 275   Cardiac Enzymes:  Recent Labs Lab 01/29/15 0853 01/29/15 1621 01/29/15 2246  TROPONINI <0.03 <0.03 <0.03   BNP (last 3 results) No results for input(s): BNP in the last 8760 hours.  ProBNP (last 3 results) No results for input(s): PROBNP in the last 8760 hours.  CBG:  Recent Labs Lab 01/30/15 0615 01/30/15 1123 01/30/15 1617 01/30/15 2117 01/31/15 0608  GLUCAP 202* 216* 162* 326* 146*    Recent Results (from the past 240 hour(s))  Blood culture (routine x 2)     Status: None (Preliminary result)   Collection Time: 01/29/15  3:00 AM  Result Value Ref Range Status   Specimen Description BLOOD RIGHT ARM  Final   Special Requests BOTTLES DRAWN AEROBIC ONLY 10CC  Final   Culture  Setup Time   Final    GRAM POSITIVE COCCI IN CLUSTERS AEROBIC BOTTLE ONLY CRITICAL RESULT CALLED TO, READ BACK BY AND VERIFIED WITH: Tilden Fossa RN 0865 01/29/15 A BROWNING    Culture   Final    GRAM POSITIVE COCCI CULTURE REINCUBATED FOR BETTER GROWTH    Report Status PENDING  Incomplete  Blood culture (routine x 2)     Status: None (Preliminary result)   Collection Time: 01/29/15  3:15 AM  Result Value Ref Range Status   Specimen Description BLOOD LEFT ARM  Final   Special Requests BOTTLES DRAWN AEROBIC AND ANAEROBIC 5CC  Final   Culture  Setup Time   Final    GRAM POSITIVE COCCI IN CLUSTERS AEROBIC BOTTLE ONLY CRITICAL RESULT CALLED TO, READ BACK BY AND VERIFIED WITH: B CARAHER,RN AT 7846 01/30/15 BY L BENFIELD    Culture GRAM POSITIVE COCCI  Final   Report Status PENDING  Incomplete  Urine culture     Status: None    Collection Time: 01/29/15  4:45 AM  Result Value Ref Range Status   Specimen Description URINE, CLEAN CATCH  Final   Special Requests NONE  Final   Culture MULTIPLE SPECIES PRESENT, SUGGEST RECOLLECTION  Final   Report Status 01/30/2015 FINAL  Final     Studies: Ct Abdomen Pelvis Wo Contrast  01/29/2015   CLINICAL DATA:  81 year old male with chest pain for 2 days and right lower quadrant abdominal pain. Initial encounter.  EXAM: CT ABDOMEN AND PELVIS WITHOUT CONTRAST  TECHNIQUE: Multidetector CT imaging of the abdomen and pelvis was performed following the standard protocol without IV contrast.  COMPARISON:  CT Abdomen and Pelvis 08/11/2009.  FINDINGS: Negative lung bases. No pericardial or pleural effusion. Sequelae of CABG.  Multilevel chronic left posterior and lateral rib fractures. Osteopenia. No acute osseous abnormality identified.  Partially visualized moderate to large scrotal hydrocele mostly on the right. No pelvic free fluid. Severe bladder  distension. Diffuse bladder wall thickening. Small diverticulum at the bladder fundus (series 2, image 49). Associated bilateral hydroureter. Tortuous, dilated ureters. Moderate bilateral hydronephrosis. Mild perinephric stranding. The prostate is enlarged measuring up to 6.5 cm diameter. No definite urologic calculus ; tiny calcifications about both renal pelves appear to be vascular related.  Stool mixed with contrast throughout the colon. Mild diverticulosis of the descending colon. Negative appendix. No dilated small bowel. Mostly decompressed stomach. Decompressed duodenum.  Noncontrast liver, gallbladder, spleen, pancreas and adrenal glands are within normal limits. No abdominal free fluid. Aortoiliac calcified atherosclerosis noted. No lymphadenopathy identified.  IMPRESSION: 1. Severe bladder distension (estimated volume 770 mL) with moderate to severe bilateral hydronephrosis and hydroureter. Diffuse bladder wall thickening. Small diverticulum at  the fundus. Enlarged prostate up to 6.5 cm diameter. 2. No other acute or inflammatory process identified in the abdomen or pelvis. Negative appendix.   Electronically Signed   By: Odessa Fleming M.D.   On: 01/29/2015 13:20   US Renal  01/29/2015   CLINICAL DATA:  Acute urinary retention, diabetes, hypertension  EXAM: RENAL / URINARY TRACT ULTRASOUND COMPLETE  COMPARISON:  CT abdomen pelvis of 01/29/2015  FINDINGS: Right Kidney:  Length: 11.8 cm. There is moderate hydronephrosis present. The parenchyma of the kidney is somewhat echogenic which may indicate chronic renal medical disease.  Left Kidney:  Length: 10.7 cm. Moderate hydronephrosis is also noted on the left. The left renal parenchymal also is echogenic suggesting chronic renal medical disease.  Bladder:  The urinary bladder is moderately distended and somewhat thick-walled suggesting a degree of bladder outlet obstruction. There is an enlarged prostate with significant hypertrophy of the median lobe. No intrinsic bladder lesion is seen.  IMPRESSION: 1. Moderate bilateral hydronephrosis. 2. Somewhat echogenic renal parenchyma suggesting chronic renal medical disease. 3. Thickened urinary bladder wall suggesting a degree of bladder obstruction with enlargement of the prostate.   Electronically Signed   By: Dwyane Dee M.D.   On: 01/29/2015 16:02    Scheduled Meds: . cefTRIAXone (ROCEPHIN)  IV  2 g Intravenous Q24H  . enoxaparin (LOVENOX) injection  30 mg Subcutaneous Q24H  . feeding supplement (ENSURE ENLIVE)  237 mL Oral BID BM  . insulin aspart  0-5 Units Subcutaneous QHS  . insulin aspart  0-9 Units Subcutaneous TID WC  . insulin detemir  5 Units Subcutaneous BID  . polyethylene glycol  17 g Oral Daily  . sodium chloride  3 mL Intravenous Q12H  . tamsulosin  0.4 mg Oral QPC supper  . vancomycin  750 mg Intravenous Q24H   Continuous Infusions: . 0.9 % NaCl with KCl 20 mEq / L 100 mL/hr at 01/31/15 0044    Time Spent: 35 min   Marinda Elk  [facility] Pager [phone]. If 7PM-7AM, please contact night-coverage at [email or web address], password [facility] 01/31/2015, 10:25 AM  LOS: 2 days

## 2015-01-31 NOTE — Progress Notes (Signed)
Inpatient Diabetes Program Recommendations  AACE/ADA: New Consensus Statement on Inpatient Glycemic Control (2013)  Target Ranges:  Prepandial:   less than 140 mg/dL      Peak postprandial:   less than 180 mg/dL (1-2 hours)      Critically ill patients:  140 - 180 mg/dL   Results for Colin [name], Colin [name] (MRN [id number]) as of 01/31/2015 10:05  Ref. Range 01/30/2015 06:15 01/30/2015 11:23 01/30/2015 16:17 01/30/2015 21:17 01/31/2015 06:08  Glucose-Capillary Latest Ref Range: 65-99 mg/dL 409 (H) 811 (H) 914 (H) 326 (H) 146 (H)   Reason for Glycemic Control Review: Hyperglycemia  Inpatient Diabetes Program Recommendations Patient consuming Ensure. Glucose increased to 326 mg/dl after Ensure. Please consider changing Ensure (44 grams of carbs) to Glucerna (23 grams of carbs).  Thanks,  Christena Deem RN, MSN, [facility] Inpatient Diabetes Coordinator Team Pager [phone]

## 2015-02-01 DIAGNOSIS — D509 Iron deficiency anemia, unspecified: Secondary | ICD-10-CM

## 2015-02-01 LAB — GLUCOSE, CAPILLARY
GLUCOSE-CAPILLARY: 103 mg/dL — AB (ref 65–99)
GLUCOSE-CAPILLARY: 230 mg/dL — AB (ref 65–99)
Glucose-Capillary: 177 mg/dL — ABNORMAL HIGH (ref 65–99)
Glucose-Capillary: 183 mg/dL — ABNORMAL HIGH (ref 65–99)

## 2015-02-01 LAB — BASIC METABOLIC PANEL
Anion gap: 7 (ref 5–15)
BUN: 13 mg/dL (ref 6–20)
CHLORIDE: 107 mmol/L (ref 101–111)
CO2: 27 mmol/L (ref 22–32)
Calcium: 8.2 mg/dL — ABNORMAL LOW (ref 8.9–10.3)
Creatinine, Ser: 1.57 mg/dL — ABNORMAL HIGH (ref 0.61–1.24)
GFR calc non Af Amer: 40 mL/min — ABNORMAL LOW (ref >60)
GFR, EST AFRICAN AMERICAN: 46 mL/min — AB (ref >60)
Glucose, Bld: 111 mg/dL — ABNORMAL HIGH (ref 65–99)
POTASSIUM: 3.6 mmol/L (ref 3.5–5.1)
SODIUM: 141 mmol/L (ref 135–145)

## 2015-02-01 LAB — PROTIME-INR
INR: 1.24 (ref 0.00–1.49)
PROTHROMBIN TIME: 15.7 s — AB (ref 11.6–15.2)

## 2015-02-01 MED ORDER — METOPROLOL TARTRATE 25 MG PO TABS
25.0000 mg | ORAL_TABLET | Freq: Two times a day (BID) | ORAL | Status: DC
  Administered 2015-02-01 – 2015-02-03 (×4): 25 mg via ORAL
  Filled 2015-02-01 (×5): qty 1

## 2015-02-01 MED ORDER — WARFARIN - PHARMACIST DOSING INPATIENT: Freq: Every day | Status: DC

## 2015-02-01 MED ORDER — SODIUM CHLORIDE 0.9 % IV SOLN
INTRAVENOUS | Status: AC
  Administered 2015-02-01: 17:00:00 via INTRAVENOUS

## 2015-02-01 MED ORDER — WARFARIN SODIUM 5 MG PO TABS
5.0000 mg | ORAL_TABLET | Freq: Once | ORAL | Status: AC
Start: 2015-02-01 — End: 2015-02-01
  Administered 2015-02-01: 5 mg via ORAL
  Filled 2015-02-01: qty 1

## 2015-02-01 MED ORDER — ENOXAPARIN SODIUM 40 MG/0.4ML ~~LOC~~ SOLN
40.0000 mg | SUBCUTANEOUS | Status: DC
  Administered 2015-02-01 – 2015-02-02 (×2): 40 mg via SUBCUTANEOUS
  Filled 2015-02-01 (×3): qty 0.4

## 2015-02-01 NOTE — Progress Notes (Signed)
MD notified and he reviewed pt saved rhythm strip. New orders received; pt in bed comfortably with family at bedside. Will continue to closely monitor. Dionne Bucy RN

## 2015-02-01 NOTE — Progress Notes (Signed)
[facility] PROGRESS NOTE  Assessment/Plan: Atypical chest pain: - No events on telemetry, cardiac biomarkers negative 3, EKG showed normal sinus rhythm normal axis. - Was given GI cocktail with improvement.  Bacteremia: - Blood cultures grew 2 out of 2 gram-positive cocci in clusters speciation and sensitivity pending, vancomycin started empirically on 01/30/2015 - 2-D echocardiogram showed an EF of 60% with grade 1 diastolic heart failure no vegetations.  - Repeat blood cultures done. - PT eval rec SNF.   Acute renal failure superimposed on stage 3 chronic kidney disease /Obstructive nephropathy with UTI (lower urinary tract infection)/hematuria - He started empirically on IV Rocephin 8.31.2016,- Urine cultures showed multiple species, but pt with perinephric stranding. - Foley was placed on admission due to urinary retention. - He continues to have copious amount of urine output, KVO IV fluid midl JVD on physical exam - Creatinine continues to improve. - Foley in place on 8.31.2016, start Flomax.   Hyponatremia/Hypokalemia: Resolved with IV hydration.  RLQ abdominal pain: - CT scan of the abdomen and pelvis was negative for acute appendicitis likely due to obstructive uropathy. Urinary bladder significantly distended, I have reviewed the CT scan personally.  Dementia/history of stroke and dysphagia: Nonambulatory at baseline. Swallowing evaluation recommended regular solids and thin liquid . Patient seemed to disheveled, so Child psychotherapist was consulted.  Uncontrolled diabetes mellitus type 2: Continue sliding scale insulin and long-acting insulin.  Seizure disorder: Continue Keppra no sinus seizures.   Iron deficiency anemia: Hemoglobin appears to be at baseline.  Essential hypertension  Severe protein caloric malnutrition: Ensure 3 times a day. Consult nutritionist.  Anemia, iron deficiency: - Continue iron therapy 3 times a day.  Prolonged QT: due to  hypokalemia.  Right lower extremity DVT: Diagnosed on 01/31/2014, INR subtherapeutic, cannot start IV heparin and Coumadin until his hematuria improves. He will need lifelong Coumadin. His hemoglobin has remained stable since 2015.  Severe protein caloric malnutrition: Continue ensure TID.  Code Status: none Family Communication: none  Disposition Plan: inpatient   Consultants:  none  Procedures: Renal ultrasound on 01/29/2015 Moderate bilateral hydronephrosis. 2. Somewhat echogenic renal parenchyma suggesting chronic renal medical disease.  Thickened urinary bladder wall suggesting a degree of bladder obstruction with enlargement of the prostate.  Antibiotics:  Rocephin 8.31.2016  HPI/Subjective: Patient relates has no new complains.  Objective: Filed Vitals:   01/31/15 0309 01/31/15 1430 01/31/15 2001 02/01/15 0518  BP: 128/70 177/79 172/68 151/70  Pulse: 85 80 108 91  Temp: 98 F (36.7 C) 98.2 F (36.8 C) 98.7 F (37.1 C) 98.8 F (37.1 C)  TempSrc: Oral Oral Oral Oral  Resp: 24 20 18 18   Height:      Weight:    64.9 kg (143 lb 1.3 oz)  SpO2: 100% 99% 100% 99%    Intake/Output Summary (Last 24 hours) at 02/01/15 0911 Last data filed at 02/01/15 0825  Gross per 24 hour  Intake 5395.77 ml  Output   5400 ml  Net  -4.23 ml   Filed Weights   01/29/15 1000 01/30/15 0400 02/01/15 0518  Weight: 62.7 kg (138 lb 3.7 oz) 62.4 kg (137 lb 9.1 oz) 64.9 kg (143 lb 1.3 oz)    Exam:  General: in no acute distress. He is awake alert and oriented 2 HEENT: No bruits, no goiter. +JVD Heart: Regular rate and rhythm. Lungs: Good air movement, clear Abdomen: Soft, nontender, nondistended, positive bowel sounds.  Neuro: Grossly intact, nonfocal.   Data Reviewed: Basic Metabolic Panel:  Recent Labs Lab 01/29/15 0137 01/30/15 0517 01/31/15 0034 02/01/15 0351  NA 131* 140 139 141  K 3.1* 3.2* 4.1 3.6  CL 94* 105 106 107  CO2 28 26 26 27   GLUCOSE 248* 214* 252*  111*  BUN 25* 21* 19 13  CREATININE 2.00* 1.98* 1.79* 1.57*  CALCIUM 8.8* 8.2* 8.1* 8.2*   Liver Function Tests:  Recent Labs Lab 01/30/15 0517  AST 14*  ALT 12*  ALKPHOS 44  BILITOT 0.3  PROT 6.1*  ALBUMIN 2.1*    Recent Labs Lab 01/29/15 0853  LIPASE 21*   No results for input(s): AMMONIA in the last 168 hours. CBC:  Recent Labs Lab 01/29/15 0137 01/30/15 0517 01/31/15 0034  WBC 8.1 6.8 6.9  HGB 9.7* 9.2* 9.7*  HCT 29.7* 27.9* 30.4*  MCV 85.1 85.3 86.4  PLT 297 298 275   Cardiac Enzymes:  Recent Labs Lab 01/29/15 0853 01/29/15 1621 01/29/15 2246  TROPONINI <0.03 <0.03 <0.03   BNP (last 3 results) No results for input(s): BNP in the last 8760 hours.  ProBNP (last 3 results) No results for input(s): PROBNP in the last 8760 hours.  CBG:  Recent Labs Lab 01/31/15 0608 01/31/15 1124 01/31/15 1634 01/31/15 2101 02/01/15 0630  GLUCAP 146* 220* 174* 211* 103*    Recent Results (from the past 240 hour(s))  Blood culture (routine x 2)     Status: None (Preliminary result)   Collection Time: 01/29/15  3:00 AM  Result Value Ref Range Status   Specimen Description BLOOD RIGHT ARM  Final   Special Requests BOTTLES DRAWN AEROBIC ONLY 10CC  Final   Culture  Setup Time   Final    GRAM POSITIVE COCCI IN CLUSTERS AEROBIC BOTTLE ONLY CRITICAL RESULT CALLED TO, READ BACK BY AND VERIFIED WITH: Tilden Fossa RN [id number] 01/29/15 A BROWNING    Culture   Final    GRAM POSITIVE COCCI CULTURE REINCUBATED FOR BETTER GROWTH    Report Status PENDING  Incomplete  Blood culture (routine x 2)     Status: None (Preliminary result)   Collection Time: 01/29/15  3:15 AM  Result Value Ref Range Status   Specimen Description BLOOD LEFT ARM  Final   Special Requests BOTTLES DRAWN AEROBIC AND ANAEROBIC 5CC  Final   Culture  Setup Time   Final    GRAM POSITIVE COCCI IN CLUSTERS AEROBIC BOTTLE ONLY CRITICAL RESULT CALLED TO, READ BACK BY AND VERIFIED WITH: B CARAHER,RN AT [id number]  01/30/15 BY L BENFIELD    Culture   Final    GRAM POSITIVE COCCI CULTURE REINCUBATED FOR BETTER GROWTH    Report Status PENDING  Incomplete  Urine culture     Status: None   Collection Time: 01/29/15  4:45 AM  Result Value Ref Range Status   Specimen Description URINE, CLEAN CATCH  Final   Special Requests NONE  Final   Culture MULTIPLE SPECIES PRESENT, SUGGEST RECOLLECTION  Final   Report Status 01/30/2015 FINAL  Final     Studies: No results found.  Scheduled Meds: . cefTRIAXone (ROCEPHIN)  IV  2 g Intravenous Q24H  . enoxaparin (LOVENOX) injection  30 mg Subcutaneous Q24H  . feeding supplement (ENSURE ENLIVE)  237 mL Oral BID BM  . insulin aspart  0-5 Units Subcutaneous QHS  . insulin aspart  0-9 Units Subcutaneous TID WC  . insulin detemir  10 Units Subcutaneous BID  . polyethylene glycol  17 g Oral Daily  . sodium chloride  3 mL  Intravenous Q12H  . tamsulosin  0.4 mg Oral QPC supper  . vancomycin  750 mg Intravenous Q24H   Continuous Infusions: . sodium chloride 100 mL/hr at 02/01/15 0354    Time Spent: 25 min   Marinda Elk  [facility] Pager [phone]. If 7PM-7AM, please contact night-coverage at [email or web address], password [facility] 02/01/2015, 9:11 AM  LOS: 3 days

## 2015-02-01 NOTE — Progress Notes (Signed)
ANTICOAGULATION CONSULT NOTE - Follow Up Consult  Pharmacy Consult for Coumadin Indication: history of DVT  Allergies  Allergen Reactions  . Ramipril     REACTION: Cough    Patient Measurements: Height:  (165.1 cm) Weight: 143 lb 1.3 oz (64.9 kg) IBW/kg (Calculated) : 61.5  Vital Signs: Temp: 98.8 F (37.1 C) (09/03 0518) Temp Source: Oral (09/03 0518) BP: 151/70 mmHg (09/03 0518) Pulse Rate: 91 (09/03 0518)  Labs:  Recent Labs  01/29/15 1621 01/29/15 2246 01/30/15 0517 01/30/15 1705 01/31/15 0034 02/01/15 0351  HGB  --   --  9.2*  --  9.7*  --   HCT  --   --  27.9*  --  30.4*  --   PLT  --   --  298  --  275  --   LABPROT  --   --   --  16.2* 15.1 15.7*  INR  --   --   --  1.29 1.17 1.24  CREATININE  --   --  1.98*  --  1.79* 1.57*  TROPONINI <0.03 <0.03  --   --   --   --     Estimated Creatinine Clearance: 32.6 mL/min (by C-G formula based on Cr of 1.57).   Assessment: 81 year old male with a right lower extremity DVT diagnosed in September 2015.  He has been on anticoagulation with Coumadin but has not taken any in the past month.  His hematuria has improved to the point where Dr. David Stall feels comfortable restarting Coumadin. Note his INR is 1.24.  Hemoglobin is stable.  Goal of Therapy:  INR 2-3 Monitor platelets by anticoagulation protocol: Yes   Plan:  Coumadin  today Daily PT/INR monitoring Follow for bleeding complications, especially worsening of hematuria.  Estella Husk, Pharm.D., BCPS, AAHIVP Clinical Pharmacist Phone: [phone] or [phone] 02/01/2015, 9:37 AM

## 2015-02-02 DIAGNOSIS — A411 Sepsis due to other specified staphylococcus: Secondary | ICD-10-CM

## 2015-02-02 DIAGNOSIS — R531 Weakness: Secondary | ICD-10-CM

## 2015-02-02 DIAGNOSIS — I2510 Atherosclerotic heart disease of native coronary artery without angina pectoris: Secondary | ICD-10-CM

## 2015-02-02 DIAGNOSIS — Z955 Presence of coronary angioplasty implant and graft: Secondary | ICD-10-CM

## 2015-02-02 DIAGNOSIS — N179 Acute kidney failure, unspecified: Secondary | ICD-10-CM | POA: Insufficient documentation

## 2015-02-02 DIAGNOSIS — N189 Chronic kidney disease, unspecified: Secondary | ICD-10-CM

## 2015-02-02 DIAGNOSIS — E1122 Type 2 diabetes mellitus with diabetic chronic kidney disease: Secondary | ICD-10-CM

## 2015-02-02 DIAGNOSIS — Z8673 Personal history of transient ischemic attack (TIA), and cerebral infarction without residual deficits: Secondary | ICD-10-CM

## 2015-02-02 DIAGNOSIS — B957 Other staphylococcus as the cause of diseases classified elsewhere: Secondary | ICD-10-CM

## 2015-02-02 LAB — GLUCOSE, CAPILLARY
GLUCOSE-CAPILLARY: 130 mg/dL — AB (ref 65–99)
Glucose-Capillary: 90 mg/dL (ref 65–99)
Glucose-Capillary: 200 mg/dL — ABNORMAL HIGH (ref 65–99)
Glucose-Capillary: 197 mg/dL — ABNORMAL HIGH (ref 65–99)

## 2015-02-02 LAB — BASIC METABOLIC PANEL
ANION GAP: 11 (ref 5–15)
BUN: 13 mg/dL (ref 6–20)
CALCIUM: 8.4 mg/dL — AB (ref 8.9–10.3)
CO2: 25 mmol/L (ref 22–32)
CREATININE: 1.48 mg/dL — AB (ref 0.61–1.24)
Chloride: 104 mmol/L (ref 101–111)
GFR calc Af Amer: 50 mL/min — ABNORMAL LOW (ref >60)
GFR, EST NON AFRICAN AMERICAN: 43 mL/min — AB (ref >60)
GLUCOSE: 80 mg/dL (ref 65–99)
Potassium: 4.1 mmol/L (ref 3.5–5.1)
Sodium: 140 mmol/L (ref 135–145)

## 2015-02-02 LAB — PROTIME-INR
INR: 1.18 (ref 0.00–1.49)
Prothrombin Time: 15.2 seconds (ref 11.6–15.2)

## 2015-02-02 LAB — CULTURE, BLOOD (ROUTINE X 2)

## 2015-02-02 MED ORDER — WARFARIN SODIUM 5 MG PO TABS
5.0000 mg | ORAL_TABLET | Freq: Once | ORAL | Status: AC
Start: 2015-02-02 — End: 2015-02-02
  Administered 2015-02-02: 5 mg via ORAL
  Filled 2015-02-02: qty 1

## 2015-02-02 MED ORDER — SODIUM CHLORIDE 0.9 % IJ SOLN
10.0000 mL | Freq: Two times a day (BID) | INTRAMUSCULAR | Status: DC
Start: 2015-02-02 — End: 2015-02-03

## 2015-02-02 MED ORDER — CEFAZOLIN SODIUM-DEXTROSE 2-3 GM-% IV SOLR
2.0000 g | Freq: Two times a day (BID) | INTRAVENOUS | Status: DC
  Administered 2015-02-02 – 2015-02-03 (×2): 2 g via INTRAVENOUS
  Filled 2015-02-02 (×5): qty 50

## 2015-02-02 MED ORDER — TAMSULOSIN HCL 0.4 MG PO CAPS: 0.4000 mg | ORAL_CAPSULE | Freq: Every day | ORAL | Status: DC

## 2015-02-02 MED ORDER — SACCHAROMYCES BOULARDII 250 MG PO CAPS
250.0000 mg | ORAL_CAPSULE | Freq: Two times a day (BID) | ORAL | Status: DC
  Administered 2015-02-02 – 2015-02-03 (×3): 250 mg via ORAL
  Filled 2015-02-02 (×4): qty 1

## 2015-02-02 MED ORDER — SODIUM CHLORIDE 0.9 % IJ SOLN
10.0000 mL | INTRAMUSCULAR | Status: DC | PRN
  Administered 2015-02-02 – 2015-02-03 (×2): 10 mL
  Filled 2015-02-02 (×2): qty 40

## 2015-02-02 NOTE — Progress Notes (Signed)
[facility] PROGRESS NOTE  Assessment/Plan: Atypical chest pain: - Resolved cont GI cocktail with improvement.  Sepsis due to Staph coagulase Bacteremia: - Vancomycin started empirically on 01/30/2015 - Blood cultures grew 2 out of 2 gram-positive cocci in clusters Coagulase negative staph sensitive to cephalosporin. Place PICC line - 2-D echocardiogram showed an EF of 60% with grade 1 diastolic heart failure no vegetations.  - Repeat blood cultures on 9.2.2016 showed no growth. - PT eval rec SNF.   Acute renal failure superimposed on stage 3 chronic kidney disease /Obstructive nephropathy with UTI (lower urinary tract infection)/hematuria - Baseline Cr 1.0-1.2. b-met is pending for this morning. - He started empirically on IV Rocephin 8.31.2016. - Urine cultures showed multiple species, but pt with perinephric stranding, cont treatment for 7 days - Foley was placed on admission due to urinary retention. - He continues to have copious amount of urine output. - Foley in place on 8.31.2016, started Flomax. hematuria possible from UTI now clearing. Consult urology, started flomax   Hyponatremia/Hypokalemia: Resolved with IV hydration.  RLQ abdominal pain: - CT as below, now resolved.  Dementia/history of stroke and dysphagia: Nonambulatory at baseline. Swallowing evaluation recommended regular solids and thin liquid . Patient seemed to disheveled, so Education officer, museum was consulted.  Uncontrolled diabetes mellitus type 2: Continue sliding scale insulin and long-acting insulin.  Seizure disorder: Continue Keppra no sinus seizures.   Iron deficiency anemia: Hemoglobin appears to be at baseline.  Essential hypertension  Severe protein caloric malnutrition: Ensure 3 times a day. Consult nutritionist.  Anemia, iron deficiency: - Continue iron therapy 3 times a day. - cont to monitor as an outpatient.  Prolonged QT: due to hypokalemia. Now resolved.  Right lower  extremity DVT: Diagnosed on 01/31/2014, INR subtherapeutic, cannot start IV heparin and Coumadin until his hematuria improves. He will need lifelong Coumadin. His hemoglobin has remained stable since 2015.   Code Status: none Family Communication: none  Disposition Plan: inpatient   Consultants:  none  Procedures: Renal ultrasound on 01/29/2015 Moderate bilateral hydronephrosis. 2. Somewhat echogenic renal parenchyma suggesting chronic renal medical disease.  Thickened urinary bladder wall suggesting a degree of bladder obstruction with enlargement of the prostate.  Antibiotics:  Rocephin 8.31.2016  HPI/Subjective: Patient relates has no new complains.  Objective: Filed Vitals:   02/01/15 1418 02/01/15 1957 02/02/15 0453 02/02/15 0454  BP: 157/87 139/59 147/63   Pulse: 93 88 85   Temp: 98 F (36.7 C) 98.7 F (37.1 C) 98.2 F (36.8 C)   TempSrc: Oral Oral Oral   Resp: 16 18 18    Height:      Weight:    64.3 kg (141 lb 12.1 oz)  SpO2: 100% 98% 100%     Intake/Output Summary (Last 24 hours) at 02/02/15 0836 Last data filed at 02/02/15 0453  Gross per 24 hour  Intake    240 ml  Output   3350 ml  Net  -3110 ml   Filed Weights   01/30/15 0400 02/01/15 0518 02/02/15 0454  Weight: 62.4 kg (137 lb 9.1 oz) 64.9 kg (143 lb 1.3 oz) 64.3 kg (141 lb 12.1 oz)    Exam:  General: in no acute distress. He is awake alert and oriented 2 HEENT: No bruits, no goiter. -JVD Heart: Regular rate and rhythm. Lungs: Good air movement, clear Abdomen: Soft, nontender, nondistended, positive bowel sounds.  Neuro: Grossly intact, nonfocal.   Data Reviewed: Basic Metabolic Panel:  Recent Labs Lab 01/29/15 0137 01/30/15 0517 01/31/15 0034  02/01/15 0351  NA 131* 140 139 141  K 3.1* 3.2* 4.1 3.6  CL 94* 105 106 107  CO2 28 26 26 27   GLUCOSE 248* 214* 252* 111*  BUN 25* 21* 19 13  CREATININE 2.00* 1.98* 1.79* 1.57*  CALCIUM 8.8* 8.2* 8.1* 8.2*   Liver Function  Tests:  Recent Labs Lab 01/30/15 0517  AST 14*  ALT 12*  ALKPHOS 44  BILITOT 0.3  PROT 6.1*  ALBUMIN 2.1*    Recent Labs Lab 01/29/15 0853  LIPASE 21*   No results for input(s): AMMONIA in the last 168 hours. CBC:  Recent Labs Lab 01/29/15 0137 01/30/15 0517 01/31/15 0034  WBC 8.1 6.8 6.9  HGB 9.7* 9.2* 9.7*  HCT 29.7* 27.9* 30.4*  MCV 85.1 85.3 86.4  PLT 297 298 275   Cardiac Enzymes:  Recent Labs Lab 01/29/15 0853 01/29/15 1621 01/29/15 2246  TROPONINI <0.03 <0.03 <0.03   BNP (last 3 results) No results for input(s): BNP in the last 8760 hours.  ProBNP (last 3 results) No results for input(s): PROBNP in the last 8760 hours.  CBG:  Recent Labs Lab 02/01/15 0630 02/01/15 1140 02/01/15 1630 02/01/15 2102 02/02/15 0620  GLUCAP 103* 230* 177* 183* 90    Recent Results (from the past 240 hour(s))  Blood culture (routine x 2)     Status: None (Preliminary result)   Collection Time: 01/29/15  3:00 AM  Result Value Ref Range Status   Specimen Description BLOOD RIGHT ARM  Final   Special Requests BOTTLES DRAWN AEROBIC ONLY 10CC  Final   Culture  Setup Time   Final    GRAM POSITIVE COCCI IN CLUSTERS AEROBIC BOTTLE ONLY CRITICAL RESULT CALLED TO, READ BACK BY AND VERIFIED WITH: Rhae Lerner RN 0177 01/29/15 A BROWNING    Culture GRAM POSITIVE COCCI  Final   Report Status PENDING  Incomplete  Blood culture (routine x 2)     Status: None (Preliminary result)   Collection Time: 01/29/15  3:15 AM  Result Value Ref Range Status   Specimen Description BLOOD LEFT ARM  Final   Special Requests BOTTLES DRAWN AEROBIC AND ANAEROBIC 5CC  Final   Culture  Setup Time   Final    GRAM POSITIVE COCCI IN CLUSTERS AEROBIC BOTTLE ONLY CRITICAL RESULT CALLED TO, READ BACK BY AND VERIFIED WITH: B CARAHER,RN AT 9390 01/30/15 BY L BENFIELD    Culture GRAM POSITIVE COCCI  Final   Report Status PENDING  Incomplete  Urine culture     Status: None   Collection Time: 01/29/15   4:45 AM  Result Value Ref Range Status   Specimen Description URINE, CLEAN CATCH  Final   Special Requests NONE  Final   Culture MULTIPLE SPECIES PRESENT, SUGGEST RECOLLECTION  Final   Report Status 01/30/2015 FINAL  Final  Culture, blood (routine x 2)     Status: None (Preliminary result)   Collection Time: 01/31/15  2:38 PM  Result Value Ref Range Status   Specimen Description BLOOD LEFT ANTECUBITAL  Final   Special Requests BOTTLES DRAWN AEROBIC ONLY 10CC  Final   Culture NO GROWTH < 24 HOURS  Final   Report Status PENDING  Incomplete  Culture, blood (routine x 2)     Status: None (Preliminary result)   Collection Time: 01/31/15  2:50 PM  Result Value Ref Range Status   Specimen Description BLOOD BLOOD LEFT HAND  Final   Special Requests BOTTLES DRAWN AEROBIC ONLY 2CC  Final  Culture NO GROWTH < 24 HOURS  Final   Report Status PENDING  Incomplete     Studies: No results found.  Scheduled Meds: . cefTRIAXone (ROCEPHIN)  IV  2 g Intravenous Q24H  . enoxaparin (LOVENOX) injection  40 mg Subcutaneous Q24H  . feeding supplement (ENSURE ENLIVE)  237 mL Oral BID BM  . insulin aspart  0-5 Units Subcutaneous QHS  . insulin aspart  0-9 Units Subcutaneous TID WC  . insulin detemir  10 Units Subcutaneous BID  . metoprolol tartrate  25 mg Oral BID  . polyethylene glycol  17 g Oral Daily  . sodium chloride  3 mL Intravenous Q12H  . tamsulosin  0.4 mg Oral QPC supper  . vancomycin  750 mg Intravenous Q24H  . Warfarin - Pharmacist Dosing Inpatient   Does not apply q1800   Continuous Infusions: . sodium chloride 75 mL/hr at 02/01/15 1721    Time Spent: 25 min   Charlynne Cousins  [facility] Pager [phone]. If 7PM-7AM, please contact night-coverage at [email or web address], password [facility] 02/02/2015, 8:36 AM  LOS: 4 days

## 2015-02-02 NOTE — Progress Notes (Signed)
Peripherally Inserted Central Catheter/Midline Placement  The IV Nurse has discussed with the patient and/or persons authorized to consent for the patient, the purpose of this procedure and the potential benefits and risks involved with this procedure.  The benefits include less needle sticks, lab draws from the catheter and patient may be discharged home with the catheter.  Risks include, but not limited to, infection, bleeding, blood clot (thrombus formation), and puncture of an artery; nerve damage and irregular heat beat.  Alternatives to this procedure were also discussed.  Consent obtain via phone from daughter.  PICC/Midline Placement Documentation  PICC / Midline Single Lumen 02/02/15 PICC Left Brachial 40 cm 0 cm (Active)  Indication for Insertion or Continuance of Line Home intravenous therapies (PICC only) 02/02/2015 11:27 AM  Exposed Catheter (cm) 0 cm 02/02/2015 11:27 AM  Site Assessment Clean;Dry;Intact 02/02/2015 11:27 AM  Line Status Flushed;Saline locked;Blood return noted 02/02/2015 11:27 AM  Dressing Type Transparent 02/02/2015 11:27 AM  Dressing Status Clean;Dry;Intact 02/02/2015 11:27 AM  Dressing Change Due 02/09/15 02/02/2015 11:27 AM       Elliot Dally 02/02/2015, 11:28 AM
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
## 2015-02-02 NOTE — Progress Notes (Signed)
ANTICOAGULATION AND ANTIBIOTIC CONSULT NOTE - Follow Up Consult  Pharmacy Consult for Coumadin; cefazolin Indication: history of DVT; Staph epidermidis bacteremia  Allergies  Allergen Reactions  . Ramipril     REACTION: Cough    Patient Measurements: Height:  (165.1 cm) Weight: 141 lb 12.1 oz (64.3 kg) IBW/kg (Calculated) : 61.5  Vital Signs: Temp: 98.2 F (36.8 C) (09/04 0453) Temp Source: Oral (09/04 0453) BP: 147/63 mmHg (09/04 0453) Pulse Rate: 85 (09/04 0453)  Labs:  Recent Labs  01/31/15 0034 02/01/15 0351 02/02/15 0900  HGB 9.7*  --   --   HCT 30.4*  --   --   PLT 275  --   --   LABPROT 15.1 15.7* 15.2  INR 1.17 1.24 1.18  CREATININE 1.79* 1.57*  --     Estimated Creatinine Clearance: 32.6 mL/min (by C-G formula based on Cr of 1.57).   Assessment: 81 year old male with a right lower extremity DVT diagnosed in September 2015.  He has been on anticoagulation with Coumadin but has not taken any in the past month.  His hematuria has improved and Coumadin was restarted. INR is subtherapeutic at 1.18.  No bleeding noted.  Pharmacy consulted to narrow antibiotics to cefazolin for Staph epidermidis. SCr is elevated at 1.57, est CrCl ~32 ml/min. If renal function continues to improve will go to q8h dosing.  Ceftriaxone 8/31>>9/4  Vanc 9/1>>9/3  8/31 BCx2 - Staph epidermidis 8/31 UCx - mult species  9/2 Blood - NGTD  Goal of Therapy:  INR 2-3 Monitor platelets by anticoagulation protocol: Yes  Eradication of infection   Plan:  Coumadin  PO today Daily PT/INR monitoring Follow for bleeding complications, especially worsening of hematuria Cefazolin 2 g IV q12h Monitor renal function  [facility], [address].D., BCPS Clinical Pharmacist Pager: [phone] 02/02/2015 10:20 AM
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
## 2015-02-03 LAB — PROTIME-INR
INR: 1.3 (ref 0.00–1.49)
PROTHROMBIN TIME: 16.3 s — AB (ref 11.6–15.2)

## 2015-02-03 LAB — GLUCOSE, CAPILLARY
GLUCOSE-CAPILLARY: 95 mg/dL (ref 65–99)
GLUCOSE-CAPILLARY: 129 mg/dL — AB (ref 65–99)
GLUCOSE-CAPILLARY: 196 mg/dL — AB (ref 65–99)

## 2015-02-03 MED ORDER — FINASTERIDE 5 MG PO TABS: 5.0000 mg | ORAL_TABLET | Freq: Every day | ORAL | Status: DC

## 2015-02-03 MED ORDER — SACCHAROMYCES BOULARDII 250 MG PO CAPS: 250.0000 mg | ORAL_CAPSULE | Freq: Two times a day (BID) | ORAL | Status: AC

## 2015-02-03 MED ORDER — METOPROLOL TARTRATE 25 MG PO TABS: 25.0000 mg | ORAL_TABLET | Freq: Two times a day (BID) | ORAL | Status: AC

## 2015-02-03 MED ORDER — ENSURE ENLIVE PO LIQD: 237.0000 mL | Freq: Two times a day (BID) | ORAL | Status: AC

## 2015-02-03 MED ORDER — TAMSULOSIN HCL 0.4 MG PO CAPS: 0.4000 mg | ORAL_CAPSULE | Freq: Every day | ORAL | Status: AC

## 2015-02-03 MED ORDER — HEPARIN SOD (PORK) LOCK FLUSH 100 UNIT/ML IV SOLN
250.0000 [IU] | INTRAVENOUS | Status: AC | PRN
  Administered 2015-02-03: 250 [IU]

## 2015-02-03 MED ORDER — WARFARIN SODIUM 5 MG PO TABS: 5.0000 mg | ORAL_TABLET | Freq: Once | ORAL | Status: DC

## 2015-02-03 MED ORDER — CEFAZOLIN SODIUM-DEXTROSE 2-3 GM-% IV SOLR: 2.0000 g | Freq: Two times a day (BID) | INTRAVENOUS | Status: AC

## 2015-02-03 MED ORDER — INSULIN DETEMIR 100 UNIT/ML ~~LOC~~ SOLN: 10.0000 [IU] | Freq: Two times a day (BID) | SUBCUTANEOUS | Status: AC

## 2015-02-03 NOTE — Progress Notes (Signed)
Patient will discharge to [facility] Anticipated discharge date: 02/03/15 Family notified: pt daughter Transportation by [employer]- scheduled for 4:30pm  CSW signing off.  Merlyn Lot, LCSWA Clinical Social Worker [phone]

## 2015-02-03 NOTE — Progress Notes (Signed)
CSW expanded bed search for potential LOG bed placement- no offers at this time.  CSW will continue to follow.  Merlyn Lot, LCSWA Clinical Social Worker [phone]

## 2015-02-03 NOTE — Progress Notes (Addendum)
CSW (Clinical Child psychotherapist) continues to try to get in touch with pt daughter today to notify of potential for dc and to update on bed offers. Have left two voicemails on cell phone number listed. Awaiting return phone call.  ADDENDUM 12:45pm: CSW was able to reach pt daughter. She is agreeable to accepting bed offer from [facility]. CSW, facility, and pt daughter attempting to work out a time for pt daughter to do paperwork. Facility and pt daughter aware and agreeable for discharge today.  Donja Tipping, LCSWA [phone]

## 2015-02-03 NOTE — Progress Notes (Signed)
CSW spoke with patient's daughter Colin [name] via phone this afternoon to discuss d/c disposition. It was very difficult to understand daughter on the phone as she states she does not hear well has has to put her phone on speaker. Unit CSW reported that patient will need 4 weeks of IV antibiotics per MD and this was relayed to patient's daughter.  She stated that her father has lived with her for several years and she does not feel she can manage IVs at home.  Daughter reports that her father can be "difficult" to care for due to his dementia- at times refuses care and is incontinent of bowel and bladder. "He will not always allow me to clean him up."  Discussed recommendation for SNF placement and daughter agrees to this as long as her father needs IV antibiotics.. She does not have a preference as to the SNF but prefers [facility] if possible.  Discussed that she would need to accept a facility that is contracted with patient's [facility] and she is agreeable. Daughter also stated that the best way to reach her is on her cell phone as the home phone listed is disconnected.  Active bed search will be initiated. Daughter requested that current unit CSW not contact her in the future; discussed role of CSW and that each CSW must address the same issues of care, placement etc- however- she remains adamant that she change CSW's.  Lorri Frederick. Jaci Lazier, [place] [phone] (weekend coverage)

## 2015-02-03 NOTE — Progress Notes (Signed)
ANTICOAGULATION AND ANTIBIOTIC CONSULT NOTE - Follow Up Consult  Pharmacy Consult for Coumadin; cefazolin Indication: history of DVT; Staph epidermidis bacteremia  Allergies  Allergen Reactions  . Ramipril     REACTION: Cough    Patient Measurements: Height:  (165.1 cm) Weight: 147 lb 0.8 oz (66.7 kg) IBW/kg (Calculated) : 61.5  Vital Signs: Temp: 97.7 F (36.5 C) (09/05 0511) Temp Source: Oral (09/05 0511) BP: 127/65 mmHg (09/05 0511) Pulse Rate: 72 (09/05 0511)  Labs:  Recent Labs  02/01/15 0351 02/02/15 0900 02/03/15 0520  LABPROT 15.7* 15.2 16.3*  INR 1.24 1.18 1.30  CREATININE 1.57* 1.48*  --     Estimated Creatinine Clearance: 34.6 mL/min (by C-G formula based on Cr of 1.48).   Assessment: 81 year old male with a right lower extremity DVT diagnosed in September 2015.  He has been on anticoagulation with Coumadin but has not taken any in the past month.  His hematuria has improved and Coumadin was restarted. INR is subtherapeutic at 1.30.  No bleeding noted.   Pharmacy consulted to narrow antibiotics to cefazolin for Staph epidermidis. SCr is elevated at 1.57, est CrCl ~34 ml/min. If renal function continues to improve will go to q8h dosing.  Ceftriaxone 8/31>>9/4  Vanc 9/1>>9/3  8/31 BCx2 - Staph epidermidis 8/31 UCx - mult species  9/2 Blood - NGTD  Goal of Therapy:  INR 2-3 Monitor platelets by anticoagulation protocol: Yes  Eradication of infection   Plan:  Coumadin  PO today Daily PT/INR monitoring Follow for bleeding complications, especially worsening of hematuria Cefazolin 2 g IV q12h Monitor renal function  Juanita Craver, PharmD, BCPS Clinical Pharmacist [phone]

## 2015-02-03 NOTE — Care Management Important Message (Signed)
Important Message  Patient Details  Name: Colin [name] MRN: [id number] Date of Birth: November 20, 1932   Medicare Important Message Given:  Yes-third notification given    Bernadette Hoit 02/03/2015, 8:51 AM

## 2015-02-03 NOTE — Progress Notes (Signed)
   Pt without complaint. A little more talkative today. When I said goodbye, he asked me "where you going?"   PE: Urine clear   A/P - bph with retention - continue foley. Change q 3-4 weeks. Pt on tamsulosin. I started finasteride. Continue both on discharge.  Gross hematuria  - cleared   Will sign off. Please call with questions.

## 2015-02-03 NOTE — Progress Notes (Signed)
[facility] PROGRESS NOTE  Assessment/Plan: Atypical chest pain: - Resolved cont GI cocktail with improvement.  Sepsis due to Staph coagulase Bacteremia: - Vancomycin started empirically on 01/30/2015 - Blood cultures grew 2 out of 2 gram-positive cocci in clusters Coagulase negative staph sensitive to cephalosporin. Place PICC line, ID was consulted who recommended a IV antibiotics for 6 weeks. We'll de-escalate s to cefazolin can probably discharge to skilled nursing facility in the next 24 hours. - Repeat blood cultures on 9.2.2016 showed no growth. - PT eval rec SNF.   Acute renal failure superimposed on stage 3 chronic kidney disease /Obstructive nephropathy with UTI (lower urinary tract infection)/hematuria - Baseline Cr 1.0-1.2. Creatinine into his improve. - treated him with rocephin 1 week in house. - Foley was placed on admission due to urinary retention, change every 30 days. - Appreciate urology's assistance., started flomax   Hyponatremia/Hypokalemia: Resolved.  RLQ abdominal pain: - CT as below, now resolved.  Dementia/history of stroke and dysphagia: Nonambulatory at baseline. Swallowing evaluation recommended regular solids and thin liquid . Patient seemed to disheveled, so Child psychotherapist was consulted.  Uncontrolled diabetes mellitus type 2: Continue sliding scale insulin and long-acting insulin.  Seizure disorder: Continue Keppra no sinus seizures.   Iron deficiency anemia: Hemoglobin appears to be at baseline.  Essential hypertension  Severe protein caloric malnutrition: Ensure 3 times a day. Consult nutritionist.  Anemia, iron deficiency: - Continue iron therapy 3 times a day. - cont to monitor as an outpatient.  Prolonged QT: due to hypokalemia. Now resolved.  Right lower extremity DVT: Diagnosed on 01/31/2014, INR subtherapeutic, cannot start IV heparin and Coumadin until his hematuria improves. He will need lifelong Coumadin. His  hemoglobin has remained stable since 2015.   Code Status: none Family Communication: none  Disposition Plan: inpatient   Consultants:  none  Procedures: Renal ultrasound on 01/29/2015 Moderate bilateral hydronephrosis. 2. Somewhat echogenic renal parenchyma suggesting chronic renal medical disease.  Thickened urinary bladder wall suggesting a degree of bladder obstruction with enlargement of the prostate.  Antibiotics:  Rocephin 8.31.2016  HPI/Subjective: No complains  Objective: Filed Vitals:   02/02/15 1138 02/02/15 1456 02/02/15 2108 02/03/15 0511  BP: 134/67 109/55 144/75 127/65  Pulse: 70 68 85 72  Temp:  99.1 F (37.3 C) 98.4 F (36.9 C) 97.7 F (36.5 C)  TempSrc:  Oral Oral Oral  Resp:  16 18 18   Height:      Weight:    66.7 kg (147 lb 0.8 oz)  SpO2:  100% 99% 99%    Intake/Output Summary (Last 24 hours) at 02/03/15 0912 Last data filed at 02/03/15 0541  Gross per 24 hour  Intake   2330 ml  Output   3000 ml  Net   -670 ml   Filed Weights   02/01/15 0518 02/02/15 0454 02/03/15 0511  Weight: 64.9 kg (143 lb 1.3 oz) 64.3 kg (141 lb 12.1 oz) 66.7 kg (147 lb 0.8 oz)    Exam:  General: in no acute distress. He is awake alert and oriented 1 HEENT: No bruits, no goiter. -JVD Heart: Regular rate and rhythm. Lungs: Good air movement, clear Abdomen: Soft, nontender, nondistended, positive bowel sounds.  Neuro: Grossly intact, nonfocal.   Data Reviewed: Basic Metabolic Panel:  Recent Labs Lab 01/29/15 0137 01/30/15 0517 01/31/15 0034 02/01/15 0351 02/02/15 0900  NA 131* 140 139 141 140  K 3.1* 3.2* 4.1 3.6 4.1  CL 94* 105 106 107 104  CO2 28 26 26  27  25  GLUCOSE 248* 214* 252* 111* 80  BUN 25* 21* 19 13 13   CREATININE 2.00* 1.98* 1.79* 1.57* 1.48*  CALCIUM 8.8* 8.2* 8.1* 8.2* 8.4*   Liver Function Tests:  Recent Labs Lab 01/30/15 0517  AST 14*  ALT 12*  ALKPHOS 44  BILITOT 0.3  PROT 6.1*  ALBUMIN 2.1*    Recent Labs Lab  01/29/15 0853  LIPASE 21*   No results for input(s): AMMONIA in the last 168 hours. CBC:  Recent Labs Lab 01/29/15 0137 01/30/15 0517 01/31/15 0034  WBC 8.1 6.8 6.9  HGB 9.7* 9.2* 9.7*  HCT 29.7* 27.9* 30.4*  MCV 85.1 85.3 86.4  PLT 297 298 275   Cardiac Enzymes:  Recent Labs Lab 01/29/15 0853 01/29/15 1621 01/29/15 2246  TROPONINI <0.03 <0.03 <0.03   BNP (last 3 results) No results for input(s): BNP in the last 8760 hours.  ProBNP (last 3 results) No results for input(s): PROBNP in the last 8760 hours.  CBG:  Recent Labs Lab 02/02/15 0620 02/02/15 1140 02/02/15 1644 02/02/15 2207 02/03/15 0616  GLUCAP 90 130* 200* 197* 95    Recent Results (from the past 240 hour(s))  Blood culture (routine x 2)     Status: None   Collection Time: 01/29/15  3:00 AM  Result Value Ref Range Status   Specimen Description BLOOD RIGHT ARM  Final   Special Requests BOTTLES DRAWN AEROBIC ONLY 10CC  Final   Culture  Setup Time   Final    GRAM POSITIVE COCCI IN CLUSTERS AEROBIC BOTTLE ONLY CRITICAL RESULT CALLED TO, READ BACK BY AND VERIFIED WITH: Tilden Fossa RN [id number] 01/29/15 A BROWNING    Culture   Final    STAPHYLOCOCCUS EPIDERMIDIS SUSCEPTIBILITIES PERFORMED ON PREVIOUS CULTURE WITHIN THE LAST 5 DAYS.    Report Status 02/02/2015 FINAL  Final  Blood culture (routine x 2)     Status: None   Collection Time: 01/29/15  3:15 AM  Result Value Ref Range Status   Specimen Description BLOOD LEFT ARM  Final   Special Requests BOTTLES DRAWN AEROBIC AND ANAEROBIC 5CC  Final   Culture  Setup Time   Final    GRAM POSITIVE COCCI IN CLUSTERS AEROBIC BOTTLE ONLY CRITICAL RESULT CALLED TO, READ BACK BY AND VERIFIED WITH: B CARAHER,RN AT 6213 01/30/15 BY L BENFIELD    Culture STAPHYLOCOCCUS EPIDERMIDIS  Final   Report Status 02/02/2015 FINAL  Final   Organism ID, Bacteria STAPHYLOCOCCUS EPIDERMIDIS  Final      Susceptibility   Staphylococcus epidermidis - MIC*    CIPROFLOXACIN <=0.5  SENSITIVE Sensitive     GENTAMICIN <=0.5 SENSITIVE Sensitive     OXACILLIN <=0.25 SENSITIVE Sensitive     TETRACYCLINE >=16 RESISTANT Resistant     VANCOMYCIN <=0.5 SENSITIVE Sensitive     TRIMETH/SULFA <=10 SENSITIVE Sensitive     CLINDAMYCIN <=0.25 SENSITIVE Sensitive     RIFAMPIN <=0.5 SENSITIVE Sensitive     Inducible Clindamycin NEGATIVE Sensitive     * STAPHYLOCOCCUS EPIDERMIDIS  Urine culture     Status: None   Collection Time: 01/29/15  4:45 AM  Result Value Ref Range Status   Specimen Description URINE, CLEAN CATCH  Final   Special Requests NONE  Final   Culture MULTIPLE SPECIES PRESENT, SUGGEST RECOLLECTION  Final   Report Status 01/30/2015 FINAL  Final  Culture, blood (routine x 2)     Status: None (Preliminary result)   Collection Time: 01/31/15  2:38 PM  Result Value Ref Range  Status   Specimen Description BLOOD LEFT ANTECUBITAL  Final   Special Requests BOTTLES DRAWN AEROBIC ONLY 10CC  Final   Culture NO GROWTH 2 DAYS  Final   Report Status PENDING  Incomplete  Culture, blood (routine x 2)     Status: None (Preliminary result)   Collection Time: 01/31/15  2:50 PM  Result Value Ref Range Status   Specimen Description BLOOD BLOOD LEFT HAND  Final   Special Requests BOTTLES DRAWN AEROBIC ONLY 2CC  Final   Culture NO GROWTH 2 DAYS  Final   Report Status PENDING  Incomplete     Studies: No results found.  Scheduled Meds: .  ceFAZolin (ANCEF) IV  2 g Intravenous Q12H  . enoxaparin (LOVENOX) injection  40 mg Subcutaneous Q24H  . feeding supplement (ENSURE ENLIVE)  237 mL Oral BID BM  . insulin aspart  0-5 Units Subcutaneous QHS  . insulin aspart  0-9 Units Subcutaneous TID WC  . insulin detemir  10 Units Subcutaneous BID  . metoprolol tartrate  25 mg Oral BID  . polyethylene glycol  17 g Oral Daily  . saccharomyces boulardii  250 mg Oral BID  . sodium chloride  10-40 mL Intracatheter Q12H  . sodium chloride  3 mL Intravenous Q12H  . tamsulosin  0.4 mg Oral QPC  supper  . Warfarin - Pharmacist Dosing Inpatient   Does not apply q1800   Continuous Infusions:    Time Spent: 25 min   Marinda Elk  [facility] Pager [phone]. If 7PM-7AM, please contact night-coverage at [email or web address], password [facility] 02/03/2015, 9:12 AM  LOS: 5 days

## 2015-02-03 NOTE — Discharge Summary (Signed)
Physician Discharge Summary  Colin [name] ZOX:[id number] DOB: 1933/04/10 DOA: 01/29/2015  PCP: Romero Belling, MD  Admit date: 01/29/2015 Discharge date: 02/03/2015  Time spent: 35* minutes  Recommendations for Outpatient Follow-up:  1. Glucose skilled nursing facility [facility] 2. Palliative to meet with patient family 3. Continue Ancef until 03/14/2015  Discharge Diagnoses:  Principal Problem:   Atypical chest pain Active Problems:   Anemia, iron deficiency   Dementia   Dehydration with hyponatremia   Dysphagia   History of stroke   Seizure disorder   History of DVT (deep vein thrombosis)   Diabetes mellitus type II, uncontrolled   CAD - status post CABG   Hyperlipidemia with target LDL less than 70   Essential hypertension   UTI (lower urinary tract infection)   RLQ abdominal pain   Acute renal failure superimposed on stage 3 chronic kidney disease   QT prolongation   Hypokalemia   Bladder outflow obstruction   Bilateral hydronephrosis   Obstructive nephropathy   Sepsis due to coagulase-negative staphylococcal infection   AKI (acute kidney injury)   Discharge Condition: stable  Diet recommendation: regular  Filed Weights   02/01/15 0518 02/02/15 0454 02/03/15 0511  Weight: 64.9 kg (143 lb 1.3 oz) 64.3 kg (141 lb 12.1 oz) 66.7 kg (147 lb 0.8 oz)    History of present illness:  81 year old male patient with past medical history of CAD with prior CABG procedure, dementia and prior stroke, chronic kidney disease stage III, diabetes mellitus currently not on medications, hypertension, dyslipidemia and apparent history of DVT. Brought to the ER by his family because of reports of chest pain. Based on the patient is nonambulatory and they have noticed for the past 2 days patient being very weak and apparently his body "going limp  Hospital Course:  Atypical chest pain: - Resolved cont GI cocktail with improvement. - Cardiac markers were negative.  Sepsis due to  Staph coagulase Bacteremia: - Vancomycin started empirically on 01/30/2015 - Blood cultures grew 2 out of 2 gram-positive cocci in clusters Coagulase negative staph sensitive to cephalosporin. Place PICC line, ID was consulted who recommended a IV antibiotics for 6 weeks of cefazolin. - Repeat blood cultures on 9.2.2016 showed no growth. - We'll continue Ancef 10.14 2016.  Acute renal failure superimposed on stage 3 chronic kidney disease /Obstructive nephropathy with UTI (lower urinary tract infection)/hematuria - Baseline Cr 1.0-1.2. Creatinine into his improve. The most likely cause of his acute kidney injury is likely obstructive uropathy is resolved with Foley placement. Urology was consulted who recommended to continue Foley change every 30 days and continue Flomax he will follow-up with urology 3-4 weeks as an outpatient. - treated him with rocephin 1 week in house.  Hyponatremia/Hypokalemia: Resolved.  RLQ abdominal pain: - CT as below, now resolved.  Dementia/history of stroke and dysphagia: Nonambulatory at baseline. Swallowing evaluation recommended regular solids and thin liquid .   Uncontrolled diabetes mellitus type 2: Continue sliding scale insulin and long-acting insulin.  Seizure disorder: Continue Keppra no sinus seizures.  Iron deficiency anemia: Hemoglobin appears to be at baseline.  Essential hypertension  Severe protein caloric malnutrition: Ensure 3 times a day. Consult nutritionist.  Prolonged QT: due to hypokalemia. Now resolved.  Right lower extremity DVT: Was started on Coumadin, will have INR checks every other day for target goal of 2.0-3. His hemoglobin has remained stable since 2015.    Procedures:  CT abd and pelvis8.31.2016  Consultations:  ID  urology  Discharge Exam: Filed Vitals:  02/03/15 0511  BP: 127/65  Pulse: 72  Temp: 97.7 F (36.5 C)  Resp: 18    General: A&O x3 Cardiovascular: RRR Respiratory: good air  movement CTA b/L  Discharge Instructions   Discharge Instructions    Diet - low sodium heart healthy    Complete by:  As directed      Increase activity slowly    Complete by:  As directed           Current Discharge Medication List    START taking these medications   Details  ceFAZolin (ANCEF) 2-3 GM-% SOLR Inject 50 mLs (2 g total) into the vein every 12 (twelve) hours.    feeding supplement, ENSURE ENLIVE, (ENSURE ENLIVE) LIQD Take 237 mLs by mouth 2 (two) times daily between meals. Qty: 237 mL, Refills: 12    insulin detemir (LEVEMIR) 100 UNIT/ML injection Inject 0.1 mLs (10 Units total) into the skin 2 (two) times daily. Qty: 10 mL, Refills: 11    metoprolol tartrate (LOPRESSOR) 25 MG tablet Take 1 tablet (25 mg total) by mouth 2 (two) times daily.    saccharomyces boulardii (FLORASTOR) 250 MG capsule Take 1 capsule (250 mg total) by mouth 2 (two) times daily.    tamsulosin (FLOMAX) 0.4 MG CAPS capsule Take 1 capsule (0.4 mg total) by mouth daily after supper. Qty: 30 capsule, Refills: 0      CONTINUE these medications which have NOT CHANGED   Details  aspirin EC 81 MG EC tablet Take 1 tablet (81 mg total) by mouth daily.    atorvastatin (LIPITOR) 40 MG tablet Take 1 tablet (40 mg total) by mouth daily at 6 PM. Qty: 30 tablet, Refills: 0    warfarin (COUMADIN) 5 MG tablet Please continue to titrate to INR 2-3. Last dose 7.5mg  02/17/2014. Last INR 1.67 on 02/18/2014 Qty: 5 tablet, Refills: 0       Allergies  Allergen Reactions  . Ramipril     REACTION: Cough   Follow-up Information    Follow up with ESKRIDGE, MATTHEW, MD In 2 weeks.   Specialty:  Urology   Contact information:   [address] [place] [address] [phone]        The results of significant diagnostics from this hospitalization (including imaging, microbiology, ancillary and laboratory) are listed below for reference.    Significant Diagnostic Studies: Ct Abdomen Pelvis Wo  Contrast  01/29/2015   CLINICAL DATA:  81 year old male with chest pain for 2 days and right lower quadrant abdominal pain. Initial encounter.  EXAM: CT ABDOMEN AND PELVIS WITHOUT CONTRAST  TECHNIQUE: Multidetector CT imaging of the abdomen and pelvis was performed following the standard protocol without IV contrast.  COMPARISON:  CT Abdomen and Pelvis 08/11/2009.  FINDINGS: Negative lung bases. No pericardial or pleural effusion. Sequelae of CABG.  Multilevel chronic left posterior and lateral rib fractures. Osteopenia. No acute osseous abnormality identified.  Partially visualized moderate to large scrotal hydrocele mostly on the right. No pelvic free fluid. Severe bladder distension. Diffuse bladder wall thickening. Small diverticulum at the bladder fundus (series 2, image 49). Associated bilateral hydroureter. Tortuous, dilated ureters. Moderate bilateral hydronephrosis. Mild perinephric stranding. The prostate is enlarged measuring up to 6.5 cm diameter. No definite urologic calculus ; tiny calcifications about both renal pelves appear to be vascular related.  Stool mixed with contrast throughout the colon. Mild diverticulosis of the descending colon. Negative appendix. No dilated small bowel. Mostly decompressed stomach. Decompressed duodenum.  Noncontrast liver, gallbladder, spleen, pancreas  and adrenal glands are within normal limits. No abdominal free fluid. Aortoiliac calcified atherosclerosis noted. No lymphadenopathy identified.  IMPRESSION: 1. Severe bladder distension (estimated volume 770 mL) with moderate to severe bilateral hydronephrosis and hydroureter. Diffuse bladder wall thickening. Small diverticulum at the fundus. Enlarged prostate up to 6.5 cm diameter. 2. No other acute or inflammatory process identified in the abdomen or pelvis. Negative appendix.   Electronically Signed   By: Odessa Fleming M.D.   On: 01/29/2015 13:20   Dg Chest 2 View  01/29/2015   CLINICAL DATA:  Acute onset of left-sided  chest pain. Initial encounter.  EXAM: CHEST  2 VIEW  COMPARISON:  Chest radiograph performed 02/13/2014  FINDINGS: The lungs are well-aerated and clear. There is no evidence of focal opacification, pleural effusion or pneumothorax.  The heart is borderline normal in size. The patient is status post median sternotomy, with evidence of prior CABG. No acute osseous abnormalities are seen.  IMPRESSION: No acute cardiopulmonary process seen.   Electronically Signed   By: Roanna Raider M.D.   On: 01/29/2015 03:22   US Renal  01/29/2015   CLINICAL DATA:  Acute urinary retention, diabetes, hypertension  EXAM: RENAL / URINARY TRACT ULTRASOUND COMPLETE  COMPARISON:  CT abdomen pelvis of 01/29/2015  FINDINGS: Right Kidney:  Length: 11.8 cm. There is moderate hydronephrosis present. The parenchyma of the kidney is somewhat echogenic which may indicate chronic renal medical disease.  Left Kidney:  Length: 10.7 cm. Moderate hydronephrosis is also noted on the left. The left renal parenchymal also is echogenic suggesting chronic renal medical disease.  Bladder:  The urinary bladder is moderately distended and somewhat thick-walled suggesting a degree of bladder outlet obstruction. There is an enlarged prostate with significant hypertrophy of the median lobe. No intrinsic bladder lesion is seen.  IMPRESSION: 1. Moderate bilateral hydronephrosis. 2. Somewhat echogenic renal parenchyma suggesting chronic renal medical disease. 3. Thickened urinary bladder wall suggesting a degree of bladder obstruction with enlargement of the prostate.   Electronically Signed   By: Dwyane Dee M.D.   On: 01/29/2015 16:02    Microbiology: Recent Results (from the past 240 hour(s))  Blood culture (routine x 2)     Status: None   Collection Time: 01/29/15  3:00 AM  Result Value Ref Range Status   Specimen Description BLOOD RIGHT ARM  Final   Special Requests BOTTLES DRAWN AEROBIC ONLY 10CC  Final   Culture  Setup Time   Final    GRAM  POSITIVE COCCI IN CLUSTERS AEROBIC BOTTLE ONLY CRITICAL RESULT CALLED TO, READ BACK BY AND VERIFIED WITH: Tilden Fossa RN 1610 01/29/15 A BROWNING    Culture   Final    STAPHYLOCOCCUS EPIDERMIDIS SUSCEPTIBILITIES PERFORMED ON PREVIOUS CULTURE WITHIN THE LAST 5 DAYS.    Report Status 02/02/2015 FINAL  Final  Blood culture (routine x 2)     Status: None   Collection Time: 01/29/15  3:15 AM  Result Value Ref Range Status   Specimen Description BLOOD LEFT ARM  Final   Special Requests BOTTLES DRAWN AEROBIC AND ANAEROBIC 5CC  Final   Culture  Setup Time   Final    GRAM POSITIVE COCCI IN CLUSTERS AEROBIC BOTTLE ONLY CRITICAL RESULT CALLED TO, READ BACK BY AND VERIFIED WITH: B CARAHER,RN AT 9604 01/30/15 BY L BENFIELD    Culture STAPHYLOCOCCUS EPIDERMIDIS  Final   Report Status 02/02/2015 FINAL  Final   Organism ID, Bacteria STAPHYLOCOCCUS EPIDERMIDIS  Final      Susceptibility  Staphylococcus epidermidis - MIC*    CIPROFLOXACIN <=0.5 SENSITIVE Sensitive     GENTAMICIN <=0.5 SENSITIVE Sensitive     OXACILLIN <=0.25 SENSITIVE Sensitive     TETRACYCLINE >=16 RESISTANT Resistant     VANCOMYCIN <=0.5 SENSITIVE Sensitive     TRIMETH/SULFA <=10 SENSITIVE Sensitive     CLINDAMYCIN <=0.25 SENSITIVE Sensitive     RIFAMPIN <=0.5 SENSITIVE Sensitive     Inducible Clindamycin NEGATIVE Sensitive     * STAPHYLOCOCCUS EPIDERMIDIS  Urine culture     Status: None   Collection Time: 01/29/15  4:45 AM  Result Value Ref Range Status   Specimen Description URINE, CLEAN CATCH  Final   Special Requests NONE  Final   Culture MULTIPLE SPECIES PRESENT, SUGGEST RECOLLECTION  Final   Report Status 01/30/2015 FINAL  Final  Culture, blood (routine x 2)     Status: None (Preliminary result)   Collection Time: 01/31/15  2:38 PM  Result Value Ref Range Status   Specimen Description BLOOD LEFT ANTECUBITAL  Final   Special Requests BOTTLES DRAWN AEROBIC ONLY 10CC  Final   Culture NO GROWTH 2 DAYS  Final   Report  Status PENDING  Incomplete  Culture, blood (routine x 2)     Status: None (Preliminary result)   Collection Time: 01/31/15  2:50 PM  Result Value Ref Range Status   Specimen Description BLOOD BLOOD LEFT HAND  Final   Special Requests BOTTLES DRAWN AEROBIC ONLY 2CC  Final   Culture NO GROWTH 2 DAYS  Final   Report Status PENDING  Incomplete     Labs: Basic Metabolic Panel:  Recent Labs Lab 01/29/15 0137 01/30/15 0517 01/31/15 0034 02/01/15 0351 02/02/15 0900  NA 131* 140 139 141 140  K 3.1* 3.2* 4.1 3.6 4.1  CL 94* 105 106 107 104  CO2 28 26 26 27 25   GLUCOSE 248* 214* 252* 111* 80  BUN 25* 21* 19 13 13   CREATININE 2.00* 1.98* 1.79* 1.57* 1.48*  CALCIUM 8.8* 8.2* 8.1* 8.2* 8.4*   Liver Function Tests:  Recent Labs Lab 01/30/15 0517  AST 14*  ALT 12*  ALKPHOS 44  BILITOT 0.3  PROT 6.1*  ALBUMIN 2.1*    Recent Labs Lab 01/29/15 0853  LIPASE 21*   No results for input(s): AMMONIA in the last 168 hours. CBC:  Recent Labs Lab 01/29/15 0137 01/30/15 0517 01/31/15 0034  WBC 8.1 6.8 6.9  HGB 9.7* 9.2* 9.7*  HCT 29.7* 27.9* 30.4*  MCV 85.1 85.3 86.4  PLT 297 298 275   Cardiac Enzymes:  Recent Labs Lab 01/29/15 0853 01/29/15 1621 01/29/15 2246  TROPONINI <0.03 <0.03 <0.03   BNP: BNP (last 3 results) No results for input(s): BNP in the last 8760 hours.  ProBNP (last 3 results) No results for input(s): PROBNP in the last 8760 hours.  CBG:  Recent Labs Lab 02/02/15 1140 02/02/15 1644 02/02/15 2207 02/03/15 0616 02/03/15 1149  GLUCAP 130* 200* 197* 95 129*       Signed:  FELIZ [name], Colin  [facility] 02/03/2015, 1:51 PM

## 2015-02-05 LAB — CULTURE, BLOOD (ROUTINE X 2)
CULTURE: NO GROWTH
CULTURE: NO GROWTH

## 2015-03-05 ENCOUNTER — Encounter: Payer: Self-pay | Admitting: Infectious Disease

## 2015-03-05 ENCOUNTER — Ambulatory Visit (INDEPENDENT_AMBULATORY_CARE_PROVIDER_SITE_OTHER): Payer: Medicare HMO | Admitting: Infectious Disease

## 2015-03-05 VITALS — BP 127/83 | HR 57 | Temp 97.6°F

## 2015-03-05 DIAGNOSIS — Z8673 Personal history of transient ischemic attack (TIA), and cerebral infarction without residual deficits: Secondary | ICD-10-CM | POA: Diagnosis not present

## 2015-03-05 DIAGNOSIS — N179 Acute kidney failure, unspecified: Secondary | ICD-10-CM

## 2015-03-05 DIAGNOSIS — Z86718 Personal history of other venous thrombosis and embolism: Secondary | ICD-10-CM

## 2015-03-05 DIAGNOSIS — F0391 Unspecified dementia with behavioral disturbance: Secondary | ICD-10-CM | POA: Diagnosis not present

## 2015-03-05 DIAGNOSIS — N183 Chronic kidney disease, stage 3 (moderate): Secondary | ICD-10-CM

## 2015-03-05 DIAGNOSIS — A411 Sepsis due to other specified staphylococcus: Secondary | ICD-10-CM
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
## 2015-03-05 NOTE — Patient Instructions (Signed)
Your  Antibiotics should be finished on 03/14/15 and then your PICC line should be pulled at [facility]

## 2015-06-01 DEATH — deceased

## 2016-02-21 IMAGING — CT CT ABD-PELV W/O CM
2 of 4 series · 15 of 46 positions shown, 17 images · non-contrast
Comparison: CT Abdomen and Pelvis 08/11/2009.

CLINICAL DATA: 82-year-old male with chest pain for 2 days and
right lower quadrant abdominal pain. Initial encounter.

EXAM:
CT ABDOMEN AND PELVIS WITHOUT CONTRAST
TECHNIQUE: Multidetector CT imaging of the abdomen and pelvis was performed
following the standard protocol without IV contrast.

[Series 2: abd/ pelvis 5.0 i30f 1 · axial · 0.68mm/px · z∈[-1242,-762]mm · 12 of 110 slices shown, 14 images]
[im 9/110  soft-tissue]
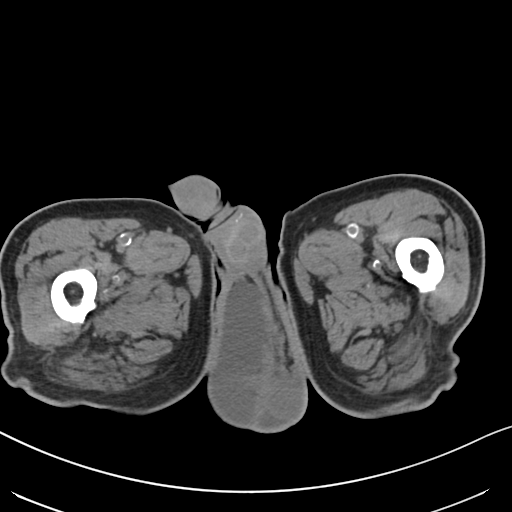
[im 9/110  bone]
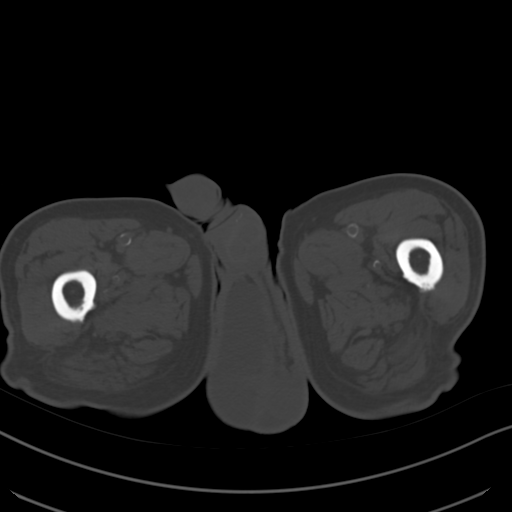
[im 18/110  soft-tissue]
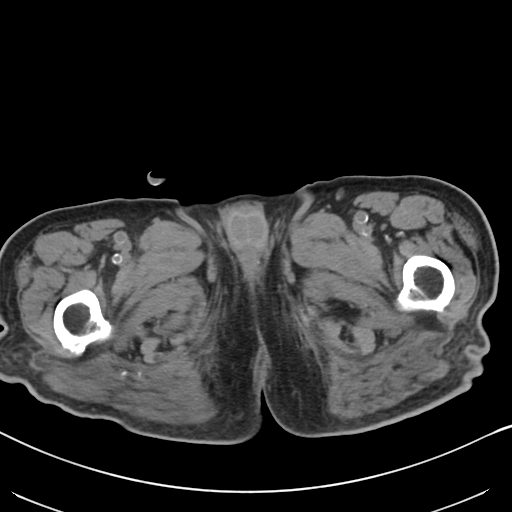
[im 27/110  soft-tissue]
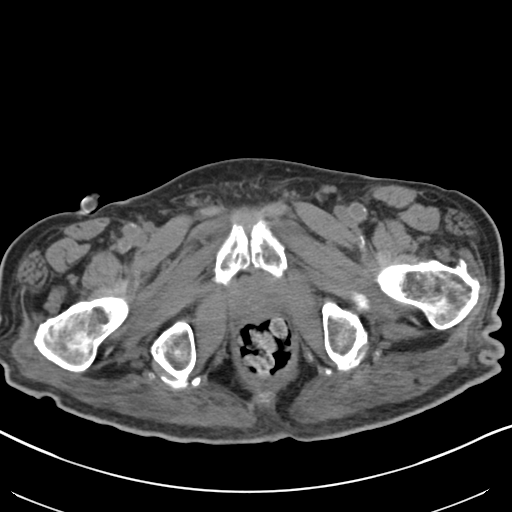
[im 35/110  soft-tissue]
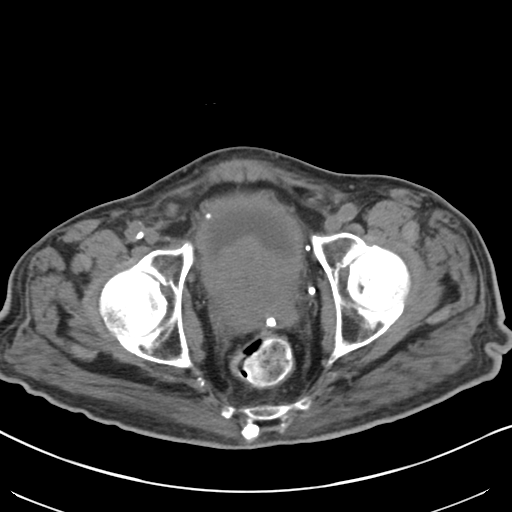
[im 44/110  soft-tissue]
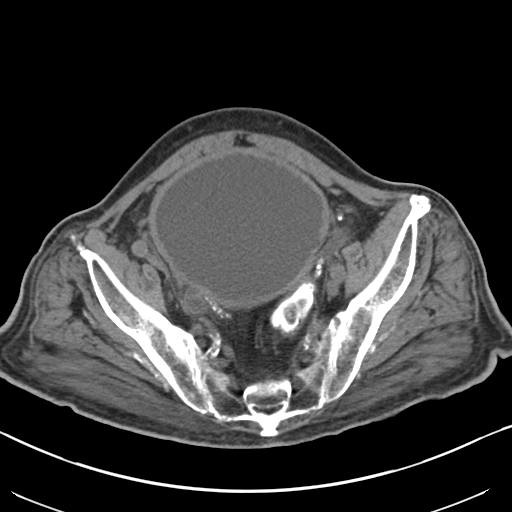
[im 53/110  soft-tissue]
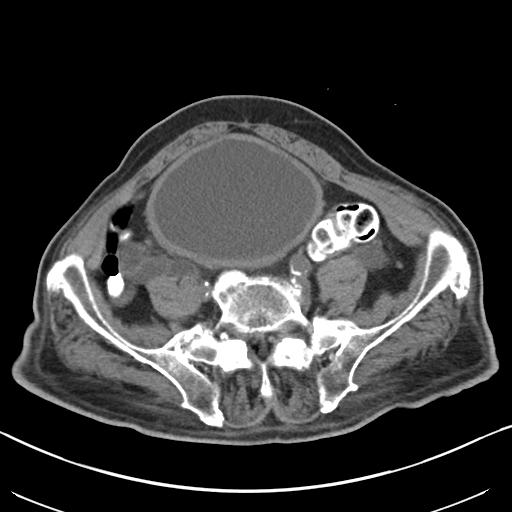
[im 62/110  soft-tissue]
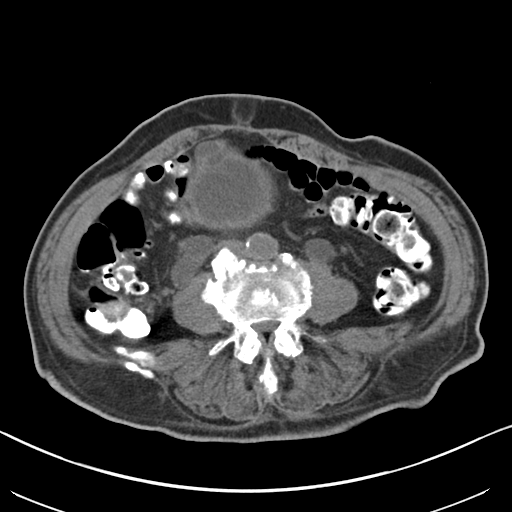
[im 70/110  soft-tissue]
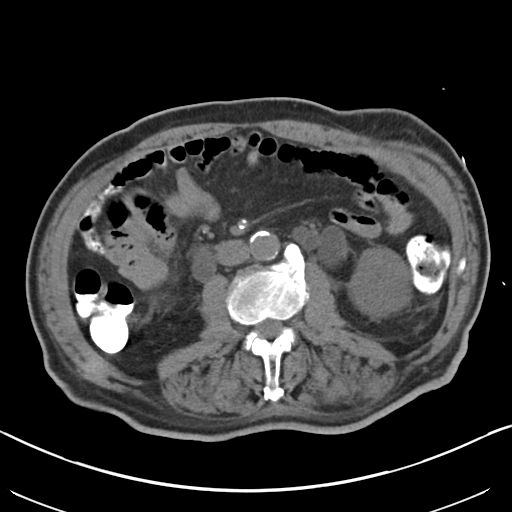
[im 79/110  soft-tissue]
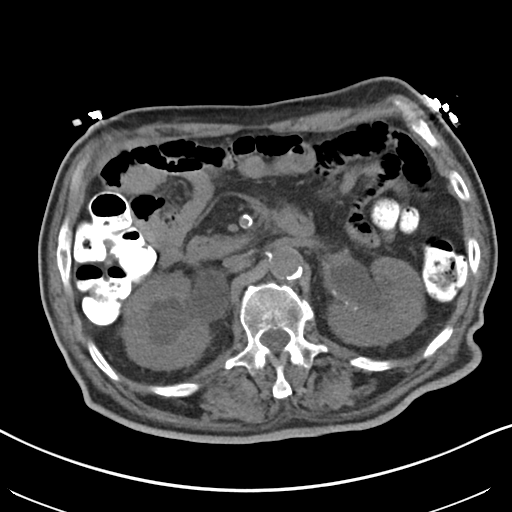
[im 79/110  bone]
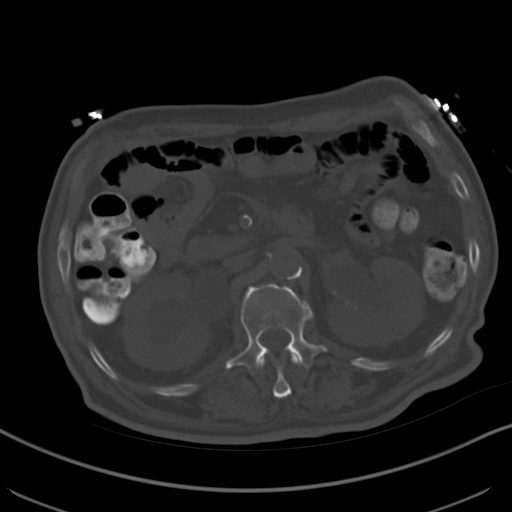
[im 88/110  soft-tissue]
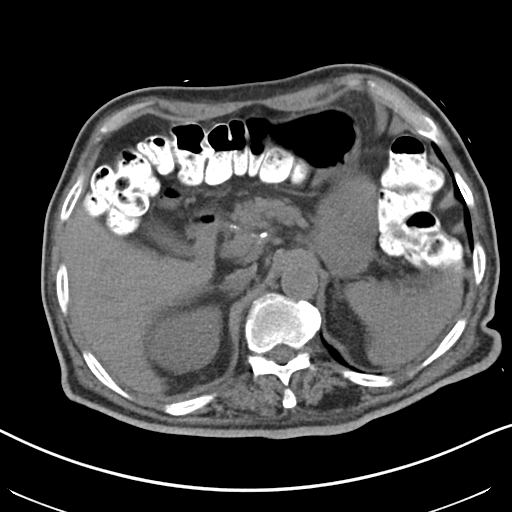
[im 96/110  soft-tissue]
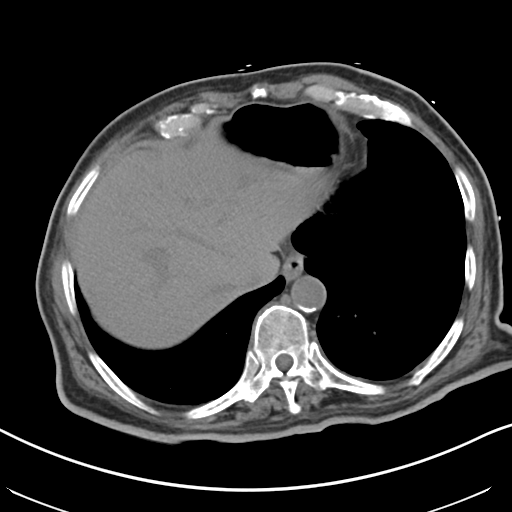
[im 105/110  soft-tissue]
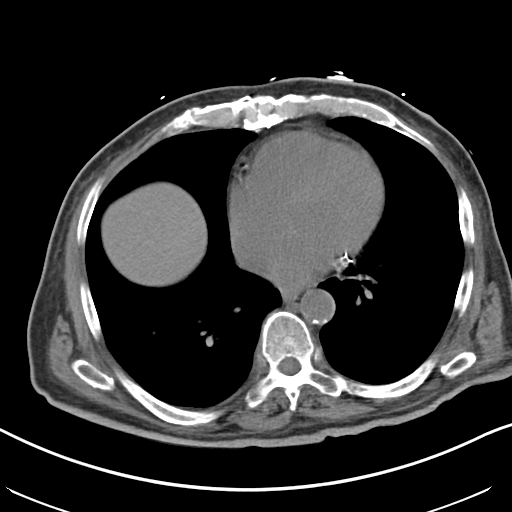

[Series 5: coronals · coronal · 0.72mm/px · 3 of 127 slices shown]
[im 43/127  soft-tissue]
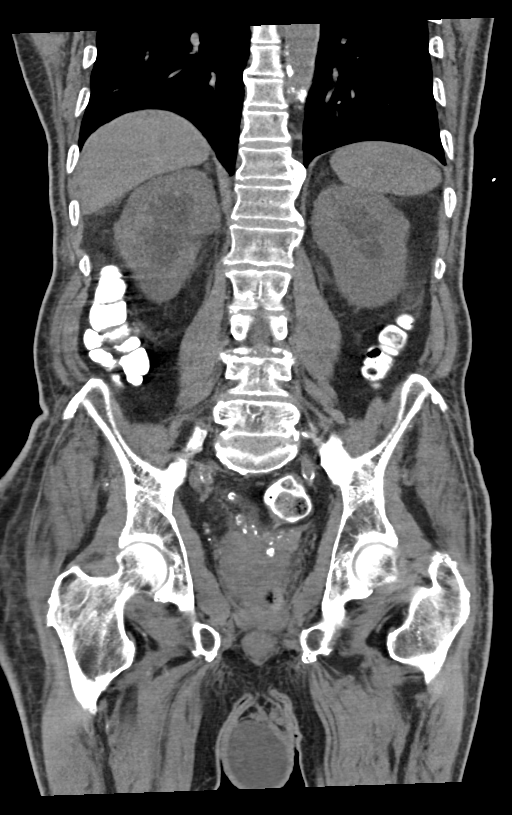
[im 57/127  soft-tissue]
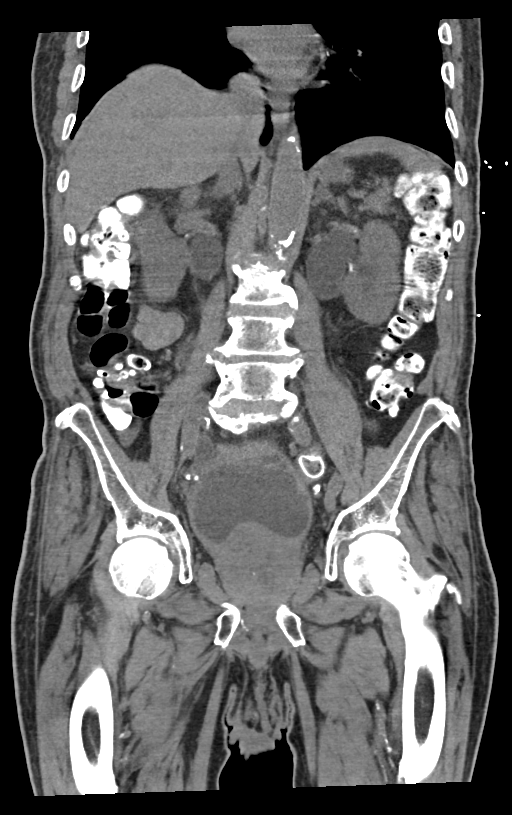
[im 71/127  soft-tissue]
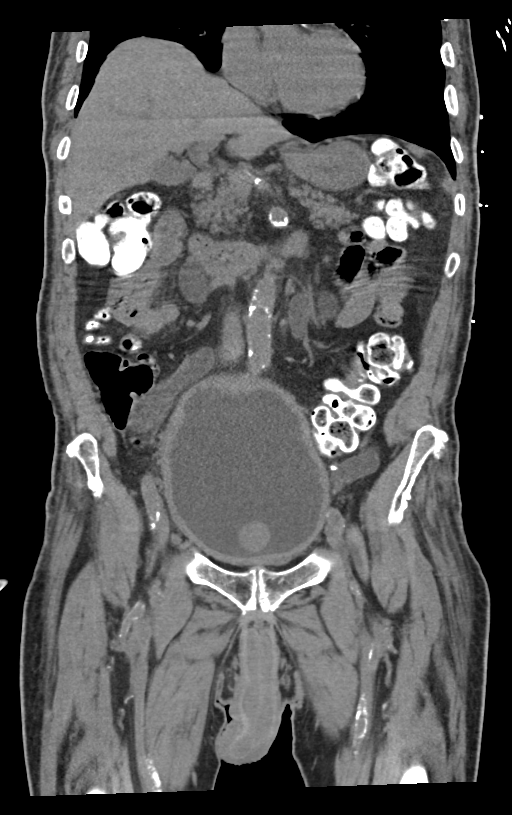

[15 of 46 positions shown; findings below may reference images not displayed]

FINDINGS: Negative lung bases. No pericardial or pleural effusion. Sequelae of
CABG.

Multilevel chronic left posterior and lateral rib fractures.
Osteopenia. No acute osseous abnormality identified.

Partially visualized moderate to large scrotal hydrocele mostly on
the right. No pelvic free fluid. Severe bladder distension. Diffuse
bladder wall thickening. Small diverticulum at the bladder fundus
(series 2, image 49). Associated bilateral hydroureter. Tortuous,
dilated ureters. Moderate bilateral hydronephrosis. Mild perinephric
stranding. The prostate is enlarged measuring up to 6.5 cm diameter.
No definite urologic calculus ; tiny calcifications about both renal
pelves appear to be vascular related.

Stool mixed with contrast throughout the colon. Mild diverticulosis
of the descending colon. Negative appendix. No dilated small bowel.
Mostly decompressed stomach. Decompressed duodenum.

Noncontrast liver, gallbladder, spleen, pancreas and adrenal glands
are within normal limits. No abdominal free fluid. Aortoiliac
calcified atherosclerosis noted. No lymphadenopathy identified.
IMPRESSION: 1. Severe bladder distension (estimated volume 770 mL) with moderate
to severe bilateral hydronephrosis and hydroureter. Diffuse bladder
wall thickening. Small diverticulum at the fundus. Enlarged prostate
up to 6.5 cm diameter.
2. No other acute or inflammatory process identified in the abdomen
or pelvis. Negative appendix.

## 2016-07-11 IMAGING — US US RENAL
1 series · 14 of 25 positions shown · non-contrast
Comparison: CT abdomen pelvis of 01/29/2015

CLINICAL DATA: Acute urinary retention, diabetes, hypertension

EXAM:
RENAL / URINARY TRACT ULTRASOUND COMPLETE

[Series 1: us renal · 0.20mm/px · 14 of 51 slices shown]
[im 1/51]
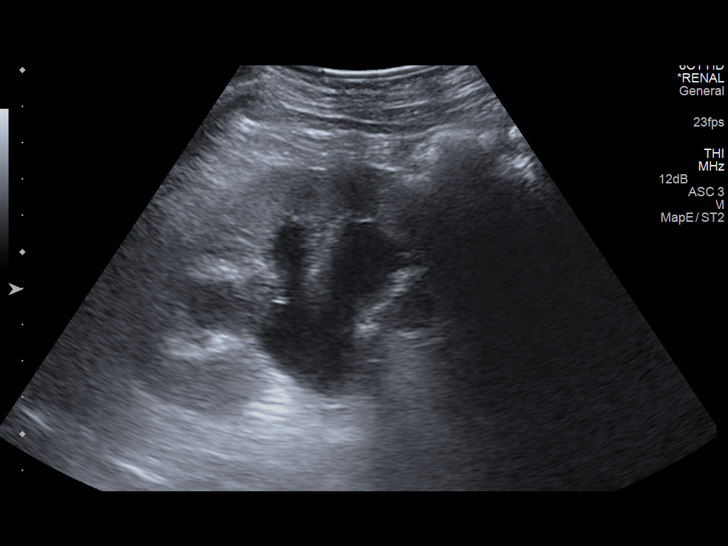
[im 5/51]
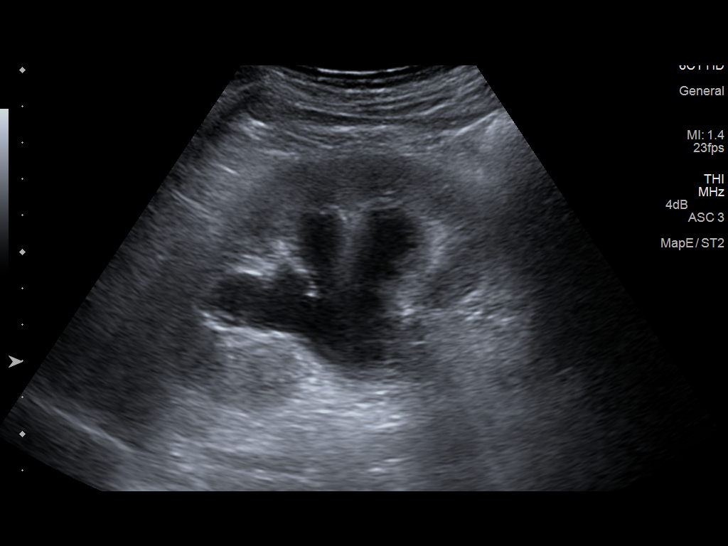
[im 9/51]
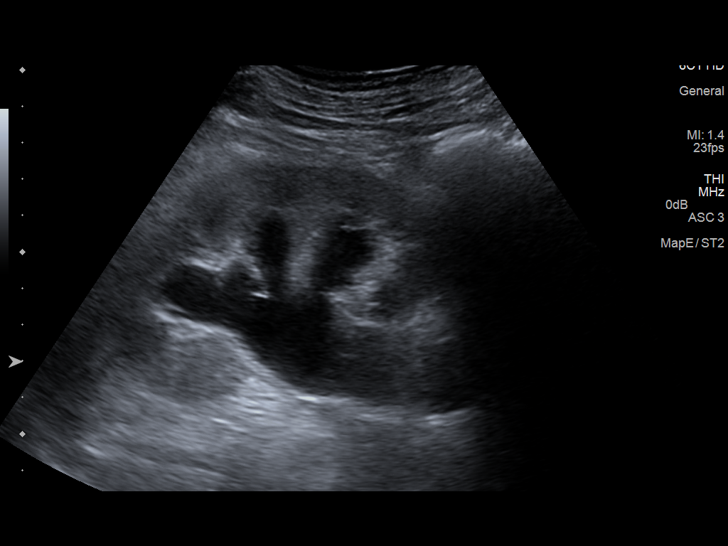
[im 13/51]
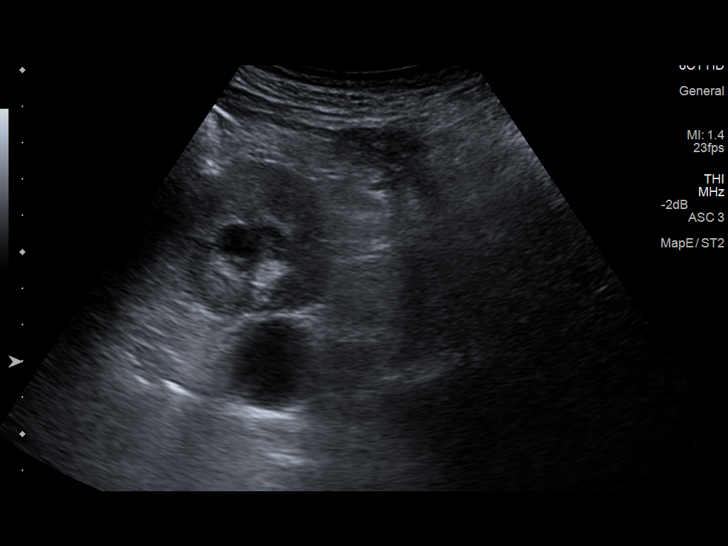
[im 17/51]
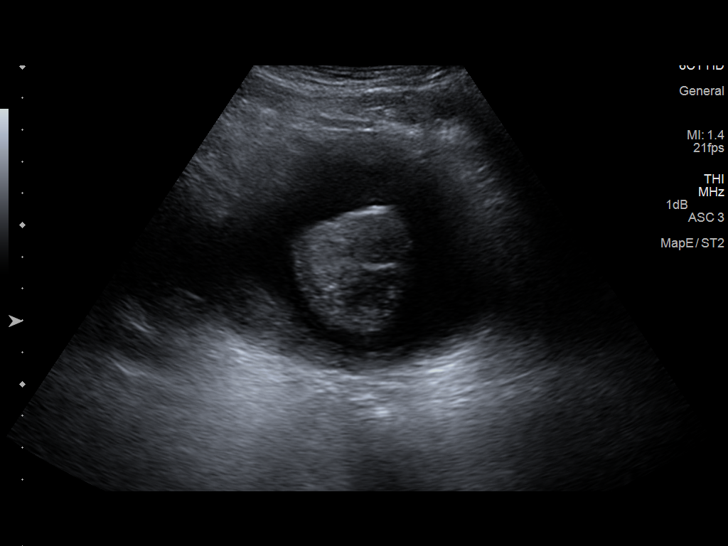
[im 19/51]
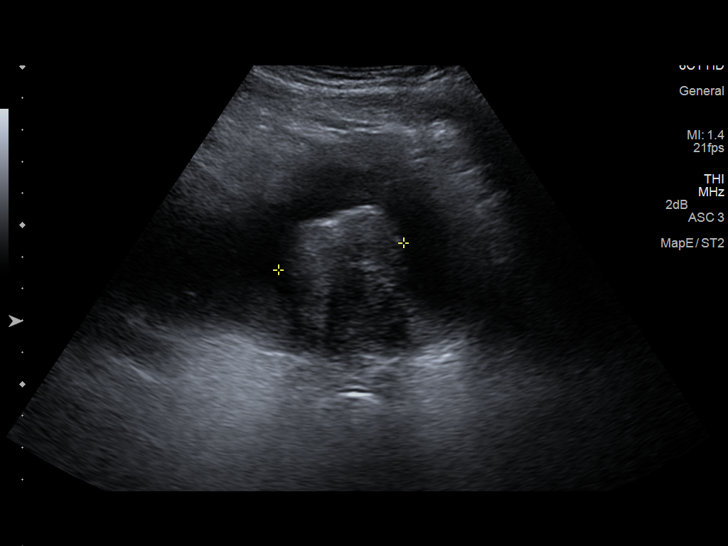
[im 23/51]
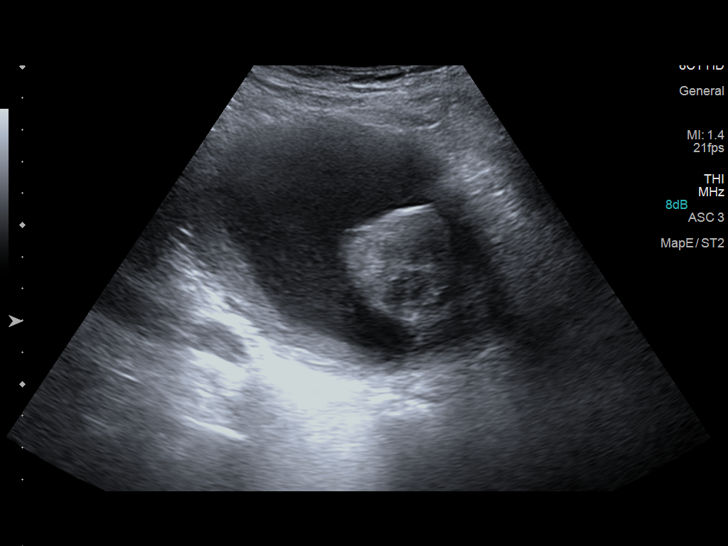
[im 28/51]
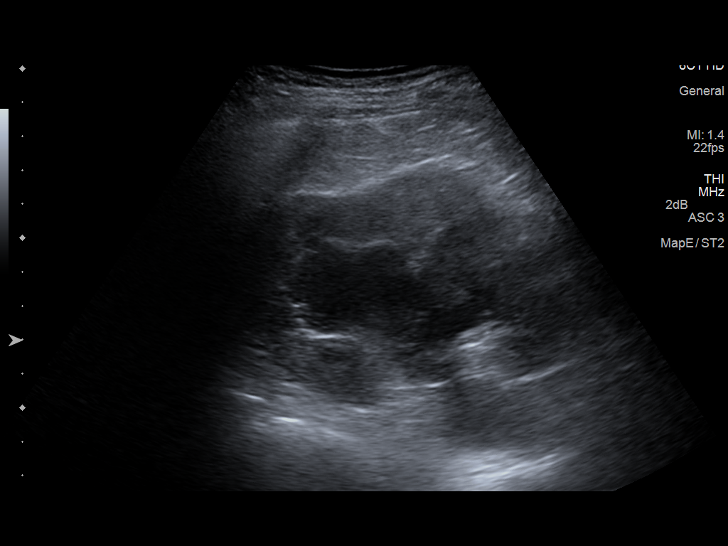
[im 32/51]
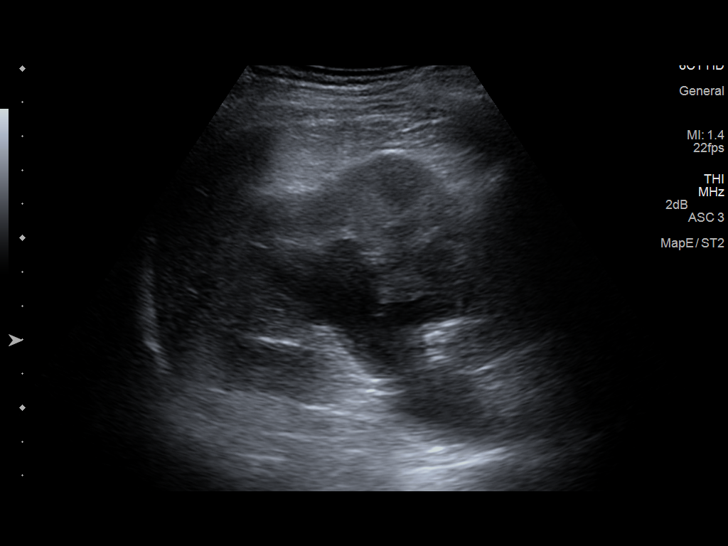
[im 34/51]
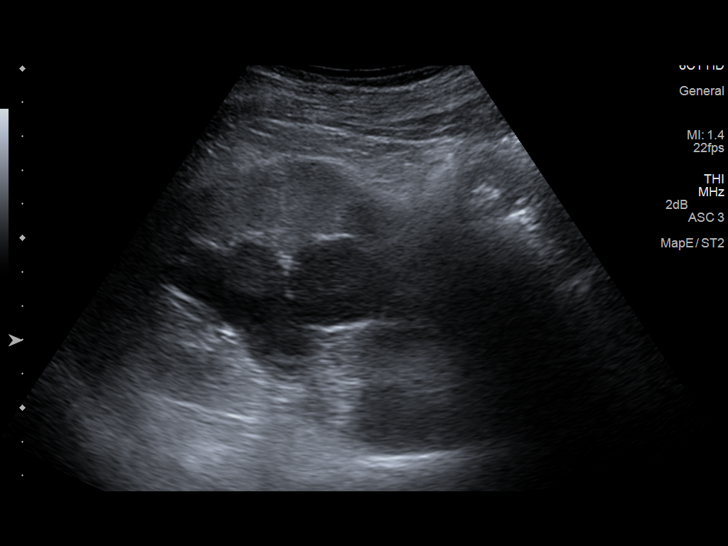
[im 38/51]
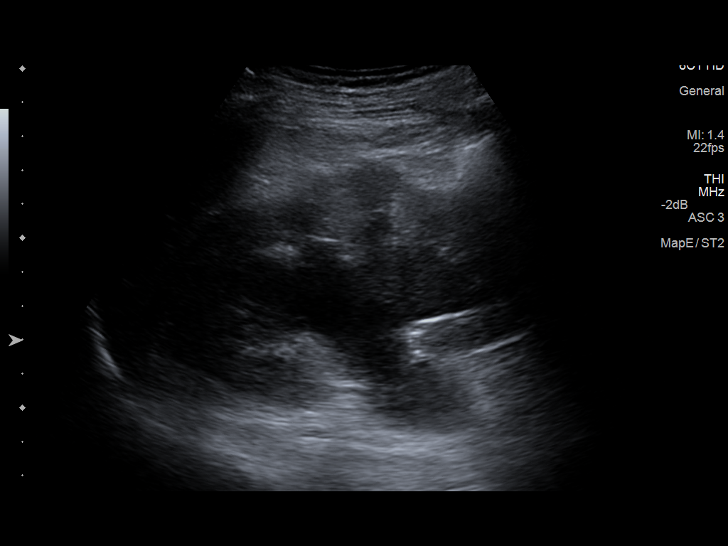
[im 42/51]
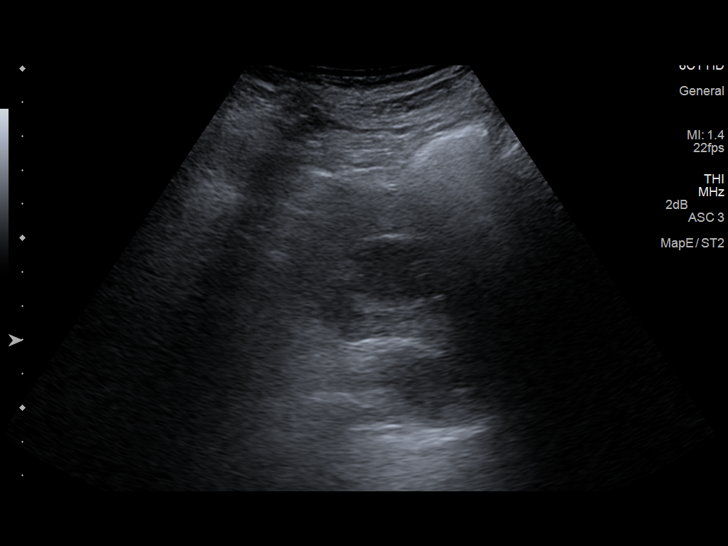
[im 46/51]
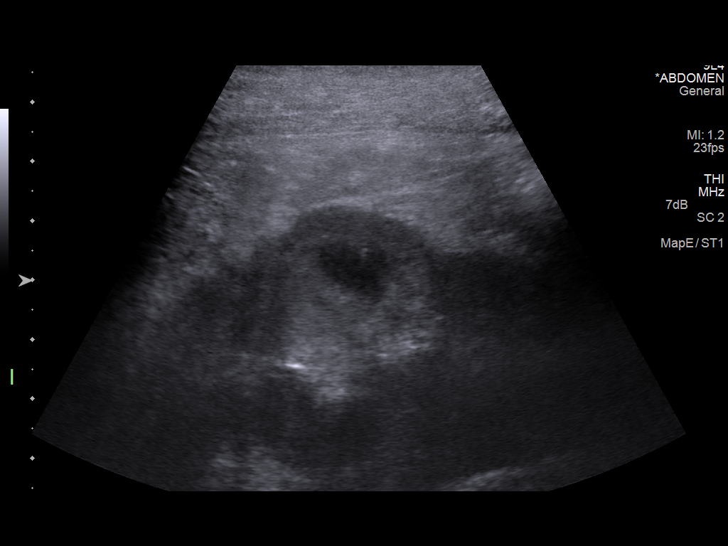
[im 51/51]
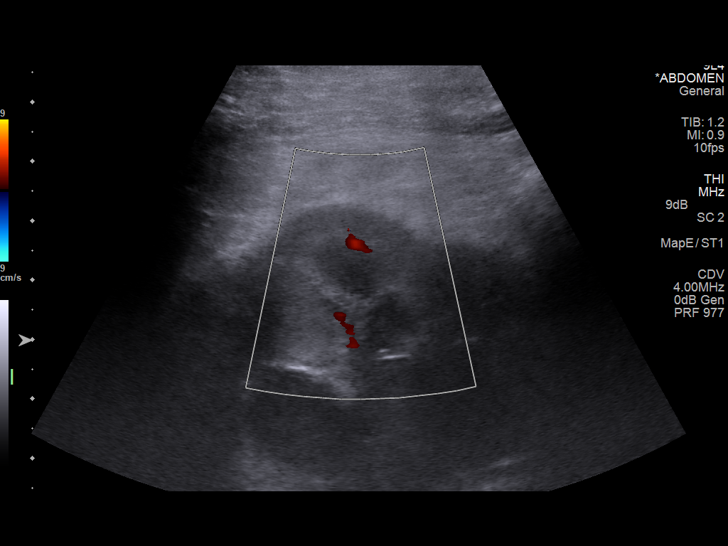

[14 of 25 positions shown; findings below may reference images not displayed]

FINDINGS: Right Kidney:

Length: 11.8 cm.. There is moderate hydronephrosis present. The
parenchyma of the kidney is somewhat echogenic which may indicate
chronic renal medical disease.

Left Kidney:

Length: 10.7 cm.. Moderate hydronephrosis is also noted on the left.
The left renal parenchymal also is echogenic suggesting chronic
renal medical disease.

Bladder:

The urinary bladder is moderately distended and somewhat
thick-walled suggesting a degree of bladder outlet obstruction.
There is an enlarged prostate with significant hypertrophy of the
median lobe. No intrinsic bladder lesion is seen.
IMPRESSION: 1. Moderate bilateral hydronephrosis.
2. Somewhat echogenic renal parenchyma suggesting chronic renal
medical disease.
3. Thickened urinary bladder wall suggesting a degree of bladder
obstruction with enlargement of the prostate.
